# Patient Record
Sex: Female | Born: 1940 | Race: White | Hispanic: No | Marital: Married | Smoking: Former smoker
Health system: Southern US, Community
[De-identification: ages and names within clinical notes are randomized; demographics above are authoritative.]

## PROBLEM LIST (undated history)

## (undated) DIAGNOSIS — E119 Type 2 diabetes mellitus without complications: Secondary | ICD-10-CM

## (undated) DIAGNOSIS — E785 Hyperlipidemia, unspecified: Secondary | ICD-10-CM

## (undated) DIAGNOSIS — I6529 Occlusion and stenosis of unspecified carotid artery: Secondary | ICD-10-CM

## (undated) DIAGNOSIS — Z8719 Personal history of other diseases of the digestive system: Secondary | ICD-10-CM

## (undated) DIAGNOSIS — I499 Cardiac arrhythmia, unspecified: Secondary | ICD-10-CM

## (undated) DIAGNOSIS — G4730 Sleep apnea, unspecified: Secondary | ICD-10-CM

## (undated) DIAGNOSIS — I2510 Atherosclerotic heart disease of native coronary artery without angina pectoris: Secondary | ICD-10-CM

## (undated) DIAGNOSIS — J302 Other seasonal allergic rhinitis: Secondary | ICD-10-CM

## (undated) DIAGNOSIS — F419 Anxiety disorder, unspecified: Secondary | ICD-10-CM

## (undated) DIAGNOSIS — I10 Essential (primary) hypertension: Secondary | ICD-10-CM

## (undated) DIAGNOSIS — H409 Unspecified glaucoma: Secondary | ICD-10-CM

## (undated) HISTORY — DX: Essential (primary) hypertension: I10

## (undated) HISTORY — DX: Cardiac arrhythmia, unspecified: I49.9

## (undated) HISTORY — DX: Hyperlipidemia, unspecified: E78.5

## (undated) HISTORY — PX: ABDOMINAL HYSTERECTOMY: SHX81

## (undated) HISTORY — PX: RECTOCELE REPAIR: SHX761

## (undated) HISTORY — DX: Atherosclerotic heart disease of native coronary artery without angina pectoris: I25.10

## (undated) HISTORY — PX: CORONARY ANGIOPLASTY: SHX604

## (undated) HISTORY — DX: Type 2 diabetes mellitus without complications: E11.9

## (undated) HISTORY — PX: CATARACT EXTRACTION: SUR2

## (undated) HISTORY — DX: Occlusion and stenosis of unspecified carotid artery: I65.29

## (undated) HISTORY — DX: Other seasonal allergic rhinitis: J30.2

## (undated) HISTORY — DX: Unspecified glaucoma: H40.9

## (undated) HISTORY — PX: OTHER SURGICAL HISTORY: SHX169

## (undated) HISTORY — PX: CHOLECYSTECTOMY: SHX55

## (undated) HISTORY — PX: TONSILLECTOMY: SUR1361

---

## 1984-12-20 HISTORY — PX: TUBAL LIGATION: SHX77

## 1989-12-20 HISTORY — PX: VAGINAL HYSTERECTOMY: SUR661

## 2013-12-20 HISTORY — PX: UPPER GASTROINTESTINAL ENDOSCOPY: SHX188

## 2015-12-21 HISTORY — PX: COLONOSCOPY: SHX174

## 2016-07-27 ENCOUNTER — Other Ambulatory Visit: Payer: Self-pay

## 2016-07-27 ENCOUNTER — Encounter: Payer: Self-pay | Admitting: Vascular Surgery

## 2016-07-27 DIAGNOSIS — I6523 Occlusion and stenosis of bilateral carotid arteries: Secondary | ICD-10-CM

## 2016-08-13 ENCOUNTER — Encounter: Payer: Self-pay | Admitting: Vascular Surgery

## 2016-08-18 ENCOUNTER — Ambulatory Visit (INDEPENDENT_AMBULATORY_CARE_PROVIDER_SITE_OTHER): Payer: Medicare Other | Admitting: Vascular Surgery

## 2016-08-18 ENCOUNTER — Encounter: Payer: Self-pay | Admitting: Vascular Surgery

## 2016-08-18 ENCOUNTER — Ambulatory Visit (HOSPITAL_COMMUNITY)
Admission: RE | Admit: 2016-08-18 | Discharge: 2016-08-18 | Disposition: A | Discharge status: Home / Self Care | Payer: Medicare Other | Source: Ambulatory Visit | Attending: Vascular Surgery | Admitting: Vascular Surgery

## 2016-08-18 VITALS — BP 130/68 | HR 67 | Temp 97.6°F | Resp 16 | Ht 60.0 in | Wt 146.0 lb

## 2016-08-18 DIAGNOSIS — I6523 Occlusion and stenosis of bilateral carotid arteries: Secondary | ICD-10-CM

## 2016-08-18 LAB — VAS US CAROTID
LCCADDIAS: -17 cm/s
LCCADSYS: -63 cm/s
LCCAPDIAS: 20 cm/s
LEFT ECA DIAS: -11 cm/s
LEFT VERTEBRAL DIAS: 12 cm/s
LICADDIAS: -34 cm/s
LICADSYS: -95 cm/s
Left CCA prox sys: 106 cm/s
Left ICA prox dias: -18 cm/s
Left ICA prox sys: -55 cm/s
RCCADSYS: -66 cm/s
RCCAPDIAS: 15 cm/s
RCCAPSYS: 65 cm/s
RIGHT CCA MID DIAS: 14 cm/s
RIGHT ECA DIAS: 5 cm/s
RIGHT VERTEBRAL DIAS: 10 cm/s

## 2016-08-18 NOTE — Progress Notes (Signed)
Patient ID: Melinda Acevedo, female   DOB: [birth year], 75 y.o.   MRN: [id number]  Reason for Consult: New Evaluation (carotid stenosis)   Referred by Orpah Greek, MD  Subjective:     HPI:  Melinda Acevedo is a 75 y.o. female history of CAD status post stenting prediabetes hypertension high cholesterol sent for evaluation of bilateral carotid artery stenosis that was less than 50% at the diagnostic imaging center. She denies any history of stroke TIA vision loss suggestive of amaurosis. She is able to walk with major limiting factor shortness of breath. She no longer has any chest pain she has no leg pain with ambulation. She is on aspirin and statin is a former smoker.  Past Medical History:  Diagnosis Date  . CAD (coronary artery disease)   . Carotid artery occlusion   . Diabetes mellitus without complication ([place])   . Hyperlipidemia   . Hypertension    Family History  Problem Relation Age of Onset  . COPD Mother   . Heart disease Mother   . Heart disease Father   . Heart attack Father    Past Surgical History:  Procedure Laterality Date  . ABDOMINAL HYSTERECTOMY    . CATARACT EXTRACTION Bilateral   . CHOLECYSTECTOMY    . heart catherization  [id number], 2010  . RECTOCELE REPAIR    . TONSILLECTOMY    . TUBAL LIGATION  1986    Short Social History:  Social History  Substance Use Topics  . Smoking status: Former Smoker    Years: 2.00    Types: Cigarettes    Quit date: 1964  . Smokeless tobacco: Never Used  . Alcohol use No    Allergies  Allergen Reactions  . Contrast Media [Iodinated Diagnostic Agents]   . Keflex [Cephalexin]   . Macrodantin [Nitrofurantoin]   . Naproxen     Current Outpatient Prescriptions  Medication Sig Dispense Refill  . amLODipine (NORVASC) 5 MG tablet Take 5 mg by mouth daily.    Marland Kitchen aspirin 81 MG tablet Take 81 mg by mouth daily.    . cholecalciferol (VITAMIN D) 1000 units tablet Take 6,000 Units by mouth daily.    . Coenzyme Q10 (COQ10  PO) Take 200 mg by mouth 2 (two) times daily.    . DiazePAM (VALIUM PO) Take by mouth as needed.    . Loratadine (CLARITIN PO) Take 10 mg by mouth daily.    . Multiple Vitamins-Minerals (MULTIVITAMIN ADULT PO) Take by mouth.    . nitroGLYCERIN (NITROSTAT) 0.4 MG SL tablet Place 0.4 mg under the tongue every 5 (five) minutes as needed for chest pain.    . RABEprazole (ACIPHEX) 20 MG tablet Take 20 mg by mouth daily.    . rosuvastatin (CRESTOR) 5 MG tablet Take 5 mg by mouth daily.    . Travoprost (TRAVATAN OP) Apply to eye.     No current facility-administered medications for this visit.     Review of Systems  Constitutional:  Constitutional negative. Eyes: Eyes negative.  Respiratory: Positive for shortness of breath.  Cardiovascular: Positive for irregular heartbeat.  GI: Gastrointestinal negative.  Musculoskeletal: Musculoskeletal negative.  Skin: Skin negative.  Neurological: Negative for facial asymmetry, focal weakness, light-headedness, numbness, seizures, speech difficulty and syncope.        Objective:  Objective   Vitals:   08/18/16 1159 08/18/16 1202  BP: 129/66 130/68  Pulse: 67 67  Resp: 16   Temp: 97.6 F (36.4 C)   SpO2: 99%   Weight:  146 lb (66.2 kg)   Height: 5' (1.524 m)    Body mass index is 28.51 kg/m.  Physical Exam  Constitutional: She is oriented to person, place, and time. She appears well-developed and well-nourished.  HENT:  Head: Normocephalic.  Eyes: Pupils are equal, round, and reactive to light.  Neck: Normal range of motion.  Cardiovascular: Normal rate and regular rhythm.   No murmur heard. Pulses:      Carotid pulses are 2+ on the right side, and 2+ on the left side.      Radial pulses are 2+ on the right side, and 2+ on the left side.       Femoral pulses are 2+ on the right side, and 2+ on the left side.      Popliteal pulses are 2+ on the right side, and 2+ on the left side.       Dorsalis pedis pulses are 2+ on the right side,  and 2+ on the left side.       Posterior tibial pulses are 2+ on the right side, and 2+ on the left side.  Abdominal: Soft. She exhibits no mass.  Musculoskeletal: Normal range of motion. She exhibits no edema or tenderness.  Neurological: She is alert and oriented to person, place, and time.  Skin: Skin is warm and dry.    Data: Doppler velocity suggest 1-39% left proximal ICA stenosis.     Assessment/Plan:   75 year old white female history of hypertension hyperlipidemia CAD status post stenting on aspirin and statin were smoker. She is here for evaluation of carotid arteries that are asymptomatic at this time with very low-grade stenoses. I recommended to her that we follow this medically and have offered her to follow-up in 2 years however she desires at this point to follow up in 1 year. I told her if the testis Valrie Hart that time we should extend her about 2 years continue her on medical therapy which she is already receiving. Should she have symptoms of stroke TIA and amaurosis which we discussed we will see her back sooner.     [name] MD [facility]

## 2016-08-19 ENCOUNTER — Other Ambulatory Visit: Payer: Self-pay | Admitting: Vascular Surgery

## 2016-08-19 DIAGNOSIS — I25709 Atherosclerosis of coronary artery bypass graft(s), unspecified, with unspecified angina pectoris: Secondary | ICD-10-CM

## 2016-08-19 DIAGNOSIS — I6529 Occlusion and stenosis of unspecified carotid artery: Secondary | ICD-10-CM

## 2016-08-20 ENCOUNTER — Encounter (HOSPITAL_COMMUNITY): Payer: Medicare Other

## 2016-08-20 ENCOUNTER — Encounter: Payer: Medicare Other | Admitting: Vascular Surgery

## 2017-08-19 ENCOUNTER — Encounter (HOSPITAL_COMMUNITY): Payer: Medicare Other

## 2017-08-19 ENCOUNTER — Ambulatory Visit: Payer: Medicare Other | Admitting: Vascular Surgery

## 2017-08-26 DIAGNOSIS — G4733 Obstructive sleep apnea (adult) (pediatric): Secondary | ICD-10-CM | POA: Insufficient documentation

## 2017-09-02 ENCOUNTER — Encounter (HOSPITAL_COMMUNITY): Payer: Medicare Other

## 2017-09-02 ENCOUNTER — Ambulatory Visit: Payer: Medicare Other | Admitting: Vascular Surgery

## 2017-10-07 ENCOUNTER — Encounter: Payer: Self-pay | Admitting: Vascular Surgery

## 2017-10-07 ENCOUNTER — Ambulatory Visit (HOSPITAL_COMMUNITY)
Admission: RE | Admit: 2017-10-07 | Discharge: 2017-10-07 | Disposition: A | Discharge status: Home / Self Care | Payer: Medicare Other | Source: Ambulatory Visit | Attending: Vascular Surgery | Admitting: Vascular Surgery

## 2017-10-07 ENCOUNTER — Ambulatory Visit (INDEPENDENT_AMBULATORY_CARE_PROVIDER_SITE_OTHER): Payer: Medicare Other | Admitting: Vascular Surgery

## 2017-10-07 VITALS — BP 155/74 | HR 64 | Temp 98.2°F | Resp 18 | Ht 60.0 in | Wt 153.5 lb

## 2017-10-07 DIAGNOSIS — I6523 Occlusion and stenosis of bilateral carotid arteries: Secondary | ICD-10-CM | POA: Diagnosis not present

## 2017-10-07 DIAGNOSIS — I6529 Occlusion and stenosis of unspecified carotid artery: Secondary | ICD-10-CM | POA: Diagnosis not present

## 2017-10-07 DIAGNOSIS — I25709 Atherosclerosis of coronary artery bypass graft(s), unspecified, with unspecified angina pectoris: Secondary | ICD-10-CM | POA: Diagnosis not present

## 2017-10-07 LAB — VAS US CAROTID
LCCADDIAS: 16 cm/s
LCCAPSYS: 106 cm/s
LEFT ECA DIAS: -9 cm/s
LEFT VERTEBRAL DIAS: 14 cm/s
LICADDIAS: -20 cm/s
LICAPDIAS: 14 cm/s
LICAPSYS: 64 cm/s
Left CCA dist sys: 69 cm/s
Left CCA prox dias: 17 cm/s
Left ICA dist sys: -72 cm/s
RIGHT CCA MID DIAS: -15 cm/s
RIGHT ECA DIAS: -11 cm/s
RIGHT VERTEBRAL DIAS: -10 cm/s
Right CCA prox dias: 11 cm/s
Right CCA prox sys: 75 cm/s
Right cca dist sys: -74 cm/s

## 2017-10-07 NOTE — Progress Notes (Signed)
History of Present Illness:  Patient is a 76 y.o. year old female who presents for follow up  evaluation of carotid stenosis.  The patient denies symptoms of TIA, amaurosis, or stroke.  The patient is currently on aspirin antiplatelet therapy.  The carotid stenosis was found post coronary stent placement.  Other medical problems include HTN, CAD and hypercholesterolemia.     Past Medical History:  Diagnosis Date  . CAD (coronary artery disease)   . Carotid artery occlusion   . Diabetes mellitus without complication ([place])   . Hyperlipidemia   . Hypertension     Past Surgical History:  Procedure Laterality Date  . ABDOMINAL HYSTERECTOMY    . CATARACT EXTRACTION Bilateral   . CHOLECYSTECTOMY    . heart catherization  [id number], 2010  . RECTOCELE REPAIR    . TONSILLECTOMY    . TUBAL LIGATION  1986     Social History Social History  Substance Use Topics  . Smoking status: Former Smoker    Years: 2.00    Types: Cigarettes    Quit date: 1964  . Smokeless tobacco: Never Used  . Alcohol use No    Family History Family History  Problem Relation Age of Onset  . COPD Mother   . Heart disease Mother   . Heart disease Father   . Heart attack Father     Allergies  Allergies  Allergen Reactions  . Contrast Media [Iodinated Diagnostic Agents]   . Keflex [Cephalexin]   . Macrodantin [Nitrofurantoin]   . Naproxen      Current Outpatient Prescriptions  Medication Sig Dispense Refill  . amLODipine (NORVASC) 5 MG tablet Take 5 mg by mouth daily.    Marland Kitchen aspirin 81 MG tablet Take 81 mg by mouth daily.    . cholecalciferol (VITAMIN D) 1000 units tablet Take 5,000 Units by mouth daily.     . Coenzyme Q10 (COQ10 PO) Take 200 mg by mouth 2 (two) times daily.    . DiazePAM (VALIUM PO) Take by mouth as needed.    . ezetimibe (ZETIA) 10 MG tablet Take 10 mg by mouth daily.    . Loratadine (CLARITIN PO) Take 10 mg by mouth daily.    . Multiple Vitamins-Minerals (MULTIVITAMIN  ADULT PO) Take by mouth.    . nitroGLYCERIN (NITROSTAT) 0.4 MG SL tablet Place 0.4 mg under the tongue every 5 (five) minutes as needed for chest pain.    . RABEprazole (ACIPHEX) 20 MG tablet Take 20 mg by mouth daily.    . Travoprost (TRAVATAN OP) Apply to eye.    . rosuvastatin (CRESTOR) 5 MG tablet Take 5 mg by mouth daily.     No current facility-administered medications for this visit.     ROS:   General:  No weight loss, Fever, chills  HEENT: No recent headaches, no nasal bleeding, no visual changes, no sore throat  Neurologic: Positive dizziness, blackouts, seizures. No recent symptoms of stroke or mini- stroke. No recent episodes of slurred speech, or temporary blindness.  Cardiac: No recent episodes of chest pain/pressure, no shortness of breath at rest.  No shortness of breath with exertion.  Denies history of atrial fibrillation or irregular heartbeat  Vascular: No history of rest pain in feet.  No history of claudication.  No history of non-healing ulcer, No history of DVT   Pulmonary: No home oxygen, no productive cough, no hemoptysis,  No asthma or wheezing  Musculoskeletal:  [ ]  Arthritis, [ ]  Low back pain,  [  x ] Joint pain  Hematologic:No history of hypercoagulable state.  No history of easy bleeding.  No history of anemia  Gastrointestinal: No hematochezia or melena,  No gastroesophageal reflux, no trouble swallowing  Urinary: [ ]  chronic Kidney disease, [ ]  on HD - [ ]  MWF or [ ]  TTHS, [ ]  Burning with urination, [ ]  Frequent urination, [ ]  Difficulty urinating;   Skin: No rashes  Psychological: No history of anxiety,  No history of depression   Physical Examination  Vitals:   10/07/17 1255 10/07/17 1258 10/07/17 1300 10/07/17 1301  BP: (!) 148/70 (!) 153/73 (!) 154/73 (!) 155/74  Pulse: 64     Resp: 18     Temp: 98.2 F (36.8 C)     TempSrc: Oral     SpO2: 99%     Weight: 153 lb 8 oz (69.6 kg)     Height: 5' (1.524 m)       Body mass index is  29.98 kg/m.  General:  Alert and oriented, no acute distress HEENT: Normal Neck: No bruit or JVD Pulmonary: Clear to auscultation bilaterally Cardiac: Regular Rate and Rhythm without murmur Gastrointestinal: Soft, non-tender, non-distended, no mass, no scars Skin: No rash Extremity Pulses:  2+ radial, brachial, femoral, dorsalis pedis, posterior tibial pulses bilaterally Musculoskeletal: No deformity or edema  Neurologic: Upper and lower extremity motor 5/5 and symmetric  DATA:  Carotid duplex shows less than 40% stenosis B ICA was independently interpreted today ([facility])   ASSESSMENT:  Asymptomatic carotid stenosis   PLAN: She has had 2 carotid duplex studies a year apart that demonstrate minimal stenosis.  She reports no symptoms of stroke.  At this point we will have her follow up in 2 years for a repeat carotid duplex.  She will continue maximum medical management with daily Aspirin, Zetia for cholesterol and HTN control.   Theda Sers, French Kendra MAUREEN PA-C [facility]  The patient was seen in conjunction with Dr. Donzetta Matters today  I have independently interviewed and examined patient and agree with PA assessment and plan above. Carotid stenosis was mildly overstated at outside facility now confirmed by 2 duplex studies here. Discussed the signs and symptoms of stroke and TIA and she demonstrates good understanding. I personally interpreted her carotid duplex. She can f/u in 2 years.  Brandon C. Donzetta Matters, MD [facility] [place] Office: [phone] Pager: [phone]

## 2019-11-30 ENCOUNTER — Ambulatory Visit: Payer: Medicare Other | Admitting: Vascular Surgery

## 2019-11-30 ENCOUNTER — Encounter (HOSPITAL_COMMUNITY): Payer: Medicare Other

## 2020-01-03 ENCOUNTER — Other Ambulatory Visit: Payer: Self-pay

## 2020-01-03 DIAGNOSIS — I6529 Occlusion and stenosis of unspecified carotid artery: Secondary | ICD-10-CM

## 2020-01-04 ENCOUNTER — Ambulatory Visit (HOSPITAL_COMMUNITY)
Admission: RE | Admit: 2020-01-04 | Discharge: 2020-01-04 | Disposition: A | Discharge status: Home / Self Care | Payer: Medicare Other | Source: Ambulatory Visit | Attending: Vascular Surgery | Admitting: Vascular Surgery

## 2020-01-04 ENCOUNTER — Ambulatory Visit (INDEPENDENT_AMBULATORY_CARE_PROVIDER_SITE_OTHER): Payer: Medicare Other | Admitting: Vascular Surgery

## 2020-01-04 ENCOUNTER — Other Ambulatory Visit: Payer: Self-pay

## 2020-01-04 ENCOUNTER — Encounter: Payer: Self-pay | Admitting: Vascular Surgery

## 2020-01-04 VITALS — BP 164/70 | HR 74 | Temp 98.0°F | Resp 20 | Wt 159.0 lb

## 2020-01-04 DIAGNOSIS — I6529 Occlusion and stenosis of unspecified carotid artery: Secondary | ICD-10-CM | POA: Diagnosis not present

## 2020-01-04 NOTE — Progress Notes (Signed)
Patient ID: Melinda Acevedo, female   DOB: 1941-07-24, 79 y.o.   MRN: [id number]  Reason for Consult: Follow-up   Referred by Lady Deutscher, MD  Subjective:     HPI:  Melinda Acevedo is a 79 y.o. female presents for evaluation carotid artery stenosis.  This was found after work-up during coronary artery stent placement.  She was here 2 years ago.  Risk factors include hypertension, hyperlipidemia, coronary artery disease, diabetes and history of smoking.  She denies any stroke TIA or amaurosis.  She takes aspirin Crestor daily.  Past Medical History:  Diagnosis Date  . CAD (coronary artery disease)   . Carotid artery occlusion   . Diabetes mellitus without complication ([place])   . Hyperlipidemia   . Hypertension    Family History  Problem Relation Age of Onset  . COPD Mother   . Heart disease Mother   . Heart disease Father   . Heart attack Father    Past Surgical History:  Procedure Laterality Date  . ABDOMINAL HYSTERECTOMY    . CATARACT EXTRACTION Bilateral   . CHOLECYSTECTOMY    . heart catherization  [id number], 2010  . RECTOCELE REPAIR    . TONSILLECTOMY    . TUBAL LIGATION  1986    Short Social History:  Social History   Tobacco Use  . Smoking status: Former Smoker    Years: 2.00    Types: Cigarettes    Quit date: 1964    Years since quitting: 57.0  . Smokeless tobacco: Never Used  Substance Use Topics  . Alcohol use: No    Allergies  Allergen Reactions  . Contrast Media [Iodinated Diagnostic Agents]   . Keflex [Cephalexin]   . Naproxen     Current Outpatient Medications  Medication Sig Dispense Refill  . amLODipine (NORVASC) 5 MG tablet Take 5 mg by mouth daily.    Marland Kitchen aspirin 81 MG tablet Take 81 mg by mouth daily.    . benazepril (LOTENSIN) 10 MG tablet     . cholecalciferol (VITAMIN D) 1000 units tablet Take 5,000 Units by mouth daily.     . Loratadine (CLARITIN PO) Take 10 mg by mouth daily.    . Multiple Vitamins-Minerals (MULTIVITAMIN ADULT PO)  Take by mouth.    . nitrofurantoin, macrocrystal-monohydrate, (MACROBID) 100 MG capsule Take 100 mg by mouth 2 (two) times daily.    . nitroGLYCERIN (NITROSTAT) 0.4 MG SL tablet Place 0.4 mg under the tongue every 5 (five) minutes as needed for chest pain.    . RABEprazole (ACIPHEX) 20 MG tablet Take 20 mg by mouth daily.    . rosuvastatin (CRESTOR) 5 MG tablet Take 5 mg by mouth daily.    . Travoprost (TRAVATAN OP) Apply to eye.     No current facility-administered medications for this visit.    Review of Systems  Constitutional:  Constitutional negative. HENT: HENT negative.  Eyes: Eyes negative.  Respiratory: Respiratory negative.  Cardiovascular: Cardiovascular negative.  GI: Gastrointestinal negative.  Musculoskeletal: Musculoskeletal negative.  Skin: Skin negative.  Neurological: Neurological negative. Hematologic: Hematologic/lymphatic negative.  Psychiatric: Psychiatric negative.        Objective:  Objective   Vitals:   01/04/20 1124 01/04/20 1128  BP: (!) 161/73 (!) 164/70  Pulse: 74   Resp: 20   Temp: 98 F (36.7 C)   SpO2: 97%   Weight: 159 lb (72.1 kg)    Body mass index is 31.05 kg/m.  Physical Exam HENT:     Head: Normocephalic.  Nose: Nose normal.     Mouth/Throat:     Mouth: Mucous membranes are moist.  Eyes:     Pupils: Pupils are equal, round, and reactive to light.  Neck:     Vascular: No carotid bruit.  Cardiovascular:     Rate and Rhythm: Normal rate and regular rhythm.  Pulmonary:     Effort: Pulmonary effort is normal.  Abdominal:     General: Abdomen is flat.     Palpations: Abdomen is soft.  Musculoskeletal:        General: No swelling. Normal range of motion.  Skin:    General: Skin is warm.     Capillary Refill: Capillary refill takes less than 2 seconds.  Neurological:     General: No focal deficit present.     Mental Status: She is alert.  Psychiatric:        Mood and Affect: Mood normal.        Behavior: Behavior  normal.        Thought Content: Thought content normal.        Judgment: Judgment normal.     Data: I have independently interpreted her carotid duplex which is 1 to 39% bilaterally.     Assessment/Plan:     79 year old female with mild stenosis bilateral carotid arteries that is asymptomatic found incidentally during work-up with coronary artery disease a few years back.  She will continue aspirin and statin.  Follow-up in 5 years with repeat duplex.     [name] MD [facility]

## 2020-01-08 ENCOUNTER — Other Ambulatory Visit: Payer: Self-pay | Admitting: *Deleted

## 2020-01-08 DIAGNOSIS — I6529 Occlusion and stenosis of unspecified carotid artery: Secondary | ICD-10-CM

## 2020-12-09 ENCOUNTER — Telehealth: Payer: Self-pay

## 2020-12-09 NOTE — Telephone Encounter (Signed)
NOTES ON Melinda Acevedo [phone], SENT REFERRAL TO SCHEDULING

## 2020-12-24 ENCOUNTER — Telehealth: Payer: Self-pay

## 2020-12-24 NOTE — Telephone Encounter (Signed)
Patient has been having bilateral neck pains since September. She has been evaluated by cardiology and is going to see GI as well. PCP would like her to see vascular again to rule out any carotid stenosis. She has a 5 year carotid follow up with [place], moved her to a slot in February for carotid scan. Patient denies any stroke like symptoms, just pain with activity. Advised her I did not think it was due to carotid stenosis but that we would do another duplex just to be sure. We discussed trying heat/cold to help relieve pain. Patient verbalizes understanding.

## 2021-01-01 ENCOUNTER — Encounter (INDEPENDENT_AMBULATORY_CARE_PROVIDER_SITE_OTHER): Payer: Self-pay | Admitting: Gastroenterology

## 2021-01-01 ENCOUNTER — Other Ambulatory Visit: Payer: Self-pay

## 2021-01-01 ENCOUNTER — Ambulatory Visit (INDEPENDENT_AMBULATORY_CARE_PROVIDER_SITE_OTHER): Payer: Medicare Other | Admitting: Gastroenterology

## 2021-01-01 DIAGNOSIS — M542 Cervicalgia: Secondary | ICD-10-CM | POA: Insufficient documentation

## 2021-01-01 DIAGNOSIS — Z86010 Personal history of colonic polyps: Secondary | ICD-10-CM

## 2021-01-01 NOTE — Progress Notes (Signed)
Maylon Peppers, M.D. Gastroenterology & Hepatology [facility] [address] [place], [place] [address] Primary Care Physician: Sherrilee Gilles, [place] [address]  Referring MD: PCP  Chief Complaint:  Neck pain  History of Present Illness: Melinda Acevedo is a 80 y.o. female with PMH CAD s/p stent placement, carotid artery occlusion, HLD, HTN, pre diabetes, glaucoma, who presents for evaluation of neck pain.  Patient reports that since September 2021 she has noticed that after exerting herself she presented recurrent episodes of neck pain. She describes the pain as a pressure that lasts 1-2 minutes when she exerts herself. It may happen once a week but she does not know why she has these symptoms. She feels a light headache when these symptoms happen. Has not presented any syncope, chest pain, lightheadedness or dizziness. The patient denies having any nausea, vomiting, fever, chills, hematochezia, melena, hematemesis, abdominal distention, abdominal pain, diarrhea, jaundice, pruritus or weight loss.  The patient states had a neck ultrasound that was negative for any alterations, no report is available.  She was evaluated by her cardiologist given the concern for angina.  The patient underwent a stress test and an myocardial perfusion imaging test on October 09, 2020 which showed an ejection fraction of 63% with normal perfusion.  She had poor exercise tolerance and developed fatigue, chest pain and shortness of breath upon exertion.  Her resting EKG showed depression of the ST segment in V3, V6, DII and aVF, this was also visualized upon the stress but no active changes were seen in the EKG.  Some PVCs were noted during exercise and a poor recovery.  Patient was cleared by her cardiologist.  Last EGD:7 years ago - small hiatal hernia per the patient, no report available Last Colonoscopy:5 years ago, had some polyps removed  (adenomas) per patient no report is available  FHx: neg for any gastrointestinal/liver disease, breast cancer aunt Social: neg smoking, alcohol or illicit drug use Surgical: hysterectomy, cholecystectomy  Past Medical History: Past Medical History:  Diagnosis Date  . CAD (coronary artery disease)   . Carotid artery occlusion   . Diabetes mellitus without complication ([place])   . Hyperlipidemia   . Hypertension     Past Surgical History: Past Surgical History:  Procedure Laterality Date  . ABDOMINAL HYSTERECTOMY    . CATARACT EXTRACTION Bilateral   . CHOLECYSTECTOMY    . heart catherization  [id number], 2010  . RECTOCELE REPAIR    . TONSILLECTOMY    . TUBAL LIGATION  1986    Family History: Family History  Problem Relation Age of Onset  . COPD Mother   . Heart disease Mother   . Heart disease Father   . Heart attack Father     Social History: Social History   Tobacco Use  Smoking Status Former Smoker  . Years: 2.00  . Types: Cigarettes  . Quit date: 1964  . Years since quitting: 58.0  Smokeless Tobacco Never Used   Social History   Substance and Sexual Activity  Alcohol Use No   Social History   Substance and Sexual Activity  Drug Use No    Allergies: Allergies  Allergen Reactions  . Contrast Media [Iodinated Diagnostic Agents]   . Keflex [Cephalexin]   . Naproxen     Medications: Current Outpatient Medications  Medication Sig Dispense Refill  . amLODipine (NORVASC) 5 MG tablet Take 5 mg by mouth daily.    Marland Kitchen aspirin 81 MG tablet Take  81 mg by mouth daily.    . benazepril (LOTENSIN) 10 MG tablet Take 10 mg by mouth 2 (two) times daily.    . cholecalciferol (VITAMIN D) 1000 units tablet Take 5,000 Units by mouth daily.     . Loratadine (CLARITIN PO) Take 10 mg by mouth daily.    . Multiple Vitamins-Minerals (MULTIVITAMIN ADULT PO) Take by mouth daily.    . nitrofurantoin, macrocrystal-monohydrate, (MACROBID) 100 MG capsule Take 100 mg by mouth 2  (two) times daily.    . nitroGLYCERIN (NITROSTAT) 0.4 MG SL tablet Place 0.4 mg under the tongue every 5 (five) minutes as needed for chest pain.    . RABEprazole (ACIPHEX) 20 MG tablet Take 20 mg by mouth daily.    . rosuvastatin (CRESTOR) 5 MG tablet Take 5 mg by mouth daily.    . Travoprost (TRAVATAN OP) Apply to eye. Patient uses 1 drop to each eye at bedtime.     No current facility-administered medications for this visit.    Review of Systems: GENERAL: negative for malaise, night sweats HEENT: No changes in hearing or vision, no nose bleeds or other nasal problems. NECK: Negative for lumps, goiter, pain and significant neck swelling RESPIRATORY: Negative for cough, wheezing CARDIOVASCULAR: Negative for chest pain, leg swelling, palpitations, orthopnea GI: SEE HPI MUSCULOSKELETAL: Negative for joint pain or swelling, back pain, and muscle pain. SKIN: Negative for lesions, rash PSYCH: Negative for sleep disturbance, mood disorder and recent psychosocial stressors. HEMATOLOGY Negative for prolonged bleeding, bruising easily, and swollen nodes. ENDOCRINE: Negative for cold or heat intolerance, polyuria, polydipsia and goiter. NEURO: negative for tremor, gait imbalance, syncope and seizures. The remainder of the review of systems is noncontributory.   Physical Exam: BP (!) 162/74 (BP Location: Left Arm, Patient Position: Sitting, Cuff Size: Large)   Pulse 76   Temp 98.4 F (36.9 C) (Oral)   Ht 5' (1.524 m)   Wt 161 lb 8 oz (73.3 kg)   BMI 31.54 kg/m  GENERAL: The patient is AO x3, in no acute distress. HEENT: Head is normocephalic and atraumatic. EOMI are intact. Mouth is well hydrated and without lesions. NECK: Supple. No masses, nontender upon palpation LUNGS: Clear to auscultation. No presence of rhonchi/wheezing/rales. Adequate chest expansion HEART: RRR, normal s1 and s2. ABDOMEN: Soft, nontender, no guarding, no peritoneal signs, and nondistended. BS +. No  masses. EXTREMITIES: Without any cyanosis, clubbing, rash, lesions or edema. NEUROLOGIC: AOx3, no focal motor deficit. SKIN: no jaundice, no rashes   Imaging/Labs: as above  I personally reviewed and interpreted the available labs, imaging and endoscopic files.  Impression and Plan: Melinda Acevedo is a 80 y.o. female with PMH CAD s/p stent placement, carotid artery occlusion, HLD, HTN, pre diabetes, glaucoma, who presents for evaluation of neck pain.  The patient had presence of neck pain induced with exertion but no other gastrointestinal complaints.  She has undergone a recent cardiac work-up to rule out any ischemia which will be one of the main etiologies that should be rule out.  However, the patient wants to get a second opinion to definitively rule out any cardiac etiology.  I consider this is appropriate.  Other etiologies such as carotid vascular impairment should be considered, she is scheduled to have a repeat Doppler next week.  I discussed with her the possibility of performing an EGD to rule out any intraluminal esophageal pathology, although I consider this is less likely why she is presenting the symptoms.  We will schedule this procedure for  the end of February so she can have the rest of the work-up performed and have clearance from the cardiovascular perspective.  At that time we will also perform a colonoscopy given her history of colonic polyps.  - Proceed with scheduled neck Doppler -Patient to update [place] with discussion held with her new cardiologist - Schedule EGD and colonoscopy in late February  All questions were answered.      Maylon Peppers, MD Gastroenterology and Hepatology [facility]

## 2021-01-01 NOTE — Patient Instructions (Signed)
Proceed with scheduled neck Doppler Please update [place] about the discussion you will hold with the new cardiologist that will evaluate you Schedule EGD and colonoscopy in late February

## 2021-01-06 ENCOUNTER — Other Ambulatory Visit: Payer: Self-pay

## 2021-01-06 ENCOUNTER — Ambulatory Visit: Payer: Medicare Other | Admitting: Cardiology

## 2021-01-06 DIAGNOSIS — E039 Hypothyroidism, unspecified: Secondary | ICD-10-CM | POA: Insufficient documentation

## 2021-01-06 DIAGNOSIS — I10 Essential (primary) hypertension: Secondary | ICD-10-CM | POA: Insufficient documentation

## 2021-01-06 DIAGNOSIS — K5900 Constipation, unspecified: Secondary | ICD-10-CM | POA: Insufficient documentation

## 2021-01-06 DIAGNOSIS — E559 Vitamin D deficiency, unspecified: Secondary | ICD-10-CM | POA: Insufficient documentation

## 2021-01-06 DIAGNOSIS — K219 Gastro-esophageal reflux disease without esophagitis: Secondary | ICD-10-CM | POA: Insufficient documentation

## 2021-01-06 DIAGNOSIS — M810 Age-related osteoporosis without current pathological fracture: Secondary | ICD-10-CM | POA: Insufficient documentation

## 2021-01-06 DIAGNOSIS — F419 Anxiety disorder, unspecified: Secondary | ICD-10-CM | POA: Insufficient documentation

## 2021-01-06 DIAGNOSIS — I6529 Occlusion and stenosis of unspecified carotid artery: Secondary | ICD-10-CM

## 2021-01-06 DIAGNOSIS — R5383 Other fatigue: Secondary | ICD-10-CM | POA: Insufficient documentation

## 2021-01-06 DIAGNOSIS — E785 Hyperlipidemia, unspecified: Secondary | ICD-10-CM | POA: Insufficient documentation

## 2021-01-06 DIAGNOSIS — K5792 Diverticulitis of intestine, part unspecified, without perforation or abscess without bleeding: Secondary | ICD-10-CM | POA: Insufficient documentation

## 2021-01-06 DIAGNOSIS — R7301 Impaired fasting glucose: Secondary | ICD-10-CM | POA: Insufficient documentation

## 2021-01-12 ENCOUNTER — Telehealth (INDEPENDENT_AMBULATORY_CARE_PROVIDER_SITE_OTHER): Payer: Self-pay | Admitting: Gastroenterology

## 2021-01-12 NOTE — Telephone Encounter (Signed)
Patient left voice mail message stating she was seen in the office on 1/13 - states she is supposed to have a colonoscopy and an EGD - states she hasn't heard anything about scheduling - please advise - ph# [phone]

## 2021-01-13 ENCOUNTER — Other Ambulatory Visit (INDEPENDENT_AMBULATORY_CARE_PROVIDER_SITE_OTHER): Payer: Self-pay

## 2021-01-13 ENCOUNTER — Telehealth (INDEPENDENT_AMBULATORY_CARE_PROVIDER_SITE_OTHER): Payer: Self-pay

## 2021-01-13 DIAGNOSIS — Z1211 Encounter for screening for malignant neoplasm of colon: Secondary | ICD-10-CM

## 2021-01-13 MED ORDER — SUTAB 1479-225-188 MG PO TABS: 188.0000 mg | ORAL_TABLET | Freq: Two times a day (BID) | ORAL | 0 refills | Status: AC

## 2021-01-13 MED ORDER — SUTAB 1479-225-188 MG PO TABS: 188.0000 mg | ORAL_TABLET | Freq: Two times a day (BID) | ORAL | 0 refills | Status: DC

## 2021-01-13 NOTE — Telephone Encounter (Signed)
LeighAnn Antwanette Wesche, CMA  

## 2021-01-13 NOTE — Telephone Encounter (Signed)
I spoke to Mrs Grondin and she is scheduled and aware

## 2021-01-15 ENCOUNTER — Encounter (INDEPENDENT_AMBULATORY_CARE_PROVIDER_SITE_OTHER): Payer: Self-pay

## 2021-01-19 ENCOUNTER — Telehealth (INDEPENDENT_AMBULATORY_CARE_PROVIDER_SITE_OTHER): Payer: Self-pay

## 2021-01-19 DIAGNOSIS — Z1211 Encounter for screening for malignant neoplasm of colon: Secondary | ICD-10-CM

## 2021-01-19 MED ORDER — NA SULFATE-K SULFATE-MG SULF 17.5-3.13-1.6 GM/177ML PO SOLN: 354.0000 mL | Freq: Once | ORAL | 0 refills | Status: AC

## 2021-01-20 NOTE — Telephone Encounter (Signed)
Melinda Acevedo, CMA  

## 2021-01-21 ENCOUNTER — Encounter (INDEPENDENT_AMBULATORY_CARE_PROVIDER_SITE_OTHER): Payer: Self-pay

## 2021-01-23 ENCOUNTER — Ambulatory Visit: Payer: Medicare Other

## 2021-01-23 ENCOUNTER — Inpatient Hospital Stay (HOSPITAL_COMMUNITY): Admission: RE | Admit: 2021-01-23 | Payer: Medicare Other | Source: Ambulatory Visit

## 2021-02-03 NOTE — Patient Instructions (Signed)
Melinda Acevedo  02/03/2021     @PREFPERIOPPHARMACY @   Your procedure is scheduled on  02/06/2021.    Report to [facility] at  [place].M.   Call this number if you have problems the morning of surgery:  [phone]   Remember:  Follow the diet and prep instructions given to you by the office.                      Take these medicines the morning of surgery with A SIP OF WATER  Amlodipine, claritin, aciphex.    Please brush your teeth.  Do not wear jewelry, make-up or nail polish.  Do not wear lotions, powders, or perfumes, or deodorant.  Do not shave 48 hours prior to surgery.  Men may shave face and neck.  Do not bring valuables to the hospital.  [facility] is not responsible for any belongings or valuables.  Contacts, dentures or bridgework may not be worn into surgery.  Leave your suitcase in the car.  After surgery it may be brought to your room.  For patients admitted to the hospital, discharge time will be determined by your treatment team.  Patients discharged the day of surgery will not be allowed to drive home and must have someone with them for 24 hours.    Special instructions:   DO NOT smoke tobacco or vape the morning of your procedure.   Please read over the following fact sheets that you were given. Anesthesia Post-op Instructions and Care and Recovery After Surgery       Upper Endoscopy, Adult, Care After This sheet gives you information about how to care for yourself after your procedure. Your health care provider may also give you more specific instructions. If you have problems or questions, contact your health care provider. What can I expect after the procedure? After the procedure, it is common to have:  A sore throat.  Mild stomach pain or discomfort.  Bloating.  Nausea. Follow these instructions at home:  Follow instructions from your health care provider about what to eat or drink after your procedure.  Return to your  normal activities as told by your health care provider. Ask your health care provider what activities are safe for you.  Take over-the-counter and prescription medicines only as told by your health care provider.  If you were given a sedative during the procedure, it can affect you for several hours. Do not drive or operate machinery until your health care provider says that it is safe.  Keep all follow-up visits as told by your health care provider. This is important.   Contact a health care provider if you have:  A sore throat that lasts longer than one day.  Trouble swallowing. Get help right away if:  You vomit blood or your vomit looks like coffee grounds.  You have: ? A fever. ? Bloody, black, or tarry stools. ? A severe sore throat or you cannot swallow. ? Difficulty breathing. ? Severe pain in your chest or abdomen. Summary  After the procedure, it is common to have a sore throat, mild stomach discomfort, bloating, and nausea.  If you were given a sedative during the procedure, it can affect you for several hours. Do not drive or operate machinery until your health care provider says that it is safe.  Follow instructions from your health care provider about what to eat or drink after your procedure.  Return to your normal activities as told by your health care provider. This information is not intended to replace advice given to you by your health care provider. Make sure you discuss any questions you have with your health care provider. Document Revised: 12/04/2019 Document Reviewed: 05/08/2018 Elsevier Patient Education  2021 [place].  Colonoscopy, Adult, Care After This sheet gives you information about how to care for yourself after your procedure. Your health care provider may also give you more specific instructions. If you have problems or questions, contact your health care provider. What can I expect after the procedure? After the procedure, it is common  to have:  A small amount of blood in your stool for 24 hours after the procedure.  Some gas.  Mild cramping or bloating of your abdomen. Follow these instructions at home: Eating and drinking  Drink enough fluid to keep your urine pale yellow.  Follow instructions from your health care provider about eating or drinking restrictions.  Resume your normal diet as instructed by your health care provider. Avoid heavy or fried foods that are hard to digest.   Activity  Rest as told by your health care provider.  Avoid sitting for a long time without moving. Get up to take short walks every 1-2 hours. This is important to improve blood flow and breathing. Ask for help if you feel weak or unsteady.  Return to your normal activities as told by your health care provider. Ask your health care provider what activities are safe for you. Managing cramping and bloating  Try walking around when you have cramps or feel bloated.  Apply heat to your abdomen as told by your health care provider. Use the heat source that your health care provider recommends, such as a moist heat pack or a heating pad. ? Place a towel between your skin and the heat source. ? Leave the heat on for 20-30 minutes. ? Remove the heat if your skin turns bright red. This is especially important if you are unable to feel pain, heat, or cold. You may have a greater risk of getting burned.   General instructions  If you were given a sedative during the procedure, it can affect you for several hours. Do not drive or operate machinery until your health care provider says that it is safe.  For the first 24 hours after the procedure: ? Do not sign important documents. ? Do not drink alcohol. ? Do your regular daily activities at a slower pace than normal. ? Eat soft foods that are easy to digest.  Take over-the-counter and prescription medicines only as told by your health care provider.  Keep all follow-up visits as told by  your health care provider. This is important. Contact a health care provider if:  You have blood in your stool 2-3 days after the procedure. Get help right away if you have:  More than a small spotting of blood in your stool.  Large blood clots in your stool.  Swelling of your abdomen.  Nausea or vomiting.  A fever.  Increasing pain in your abdomen that is not relieved with medicine. Summary  After the procedure, it is common to have a small amount of blood in your stool. You may also have mild cramping and bloating of your abdomen.  If you were given a sedative during the procedure, it can affect you for several hours. Do not drive or operate machinery until your health care provider says that it is safe.  Get help right away if you have a lot of blood in your stool, nausea or vomiting, a fever, or increased pain in your abdomen. This information is not intended to replace advice given to you by your health care provider. Make sure you discuss any questions you have with your health care provider. Document Revised: 11/30/2019 Document Reviewed: 07/02/2019 Elsevier Patient Education  2021 [place] After This sheet gives you information about how to care for yourself after your procedure. Your health care provider may also give you more specific instructions. If you have problems or questions, contact your health care provider. What can I expect after the procedure? After the procedure, it is common to have:  Tiredness.  Forgetfulness about what happened after the procedure.  Impaired judgment for important decisions.  Nausea or vomiting.  Some difficulty with balance. Follow these instructions at home: For the time period you were told by your health care provider:  Rest as needed.  Do not participate in activities where you could fall or become injured.  Do not drive or use machinery.  Do not drink alcohol.  Do not take  sleeping pills or medicines that cause drowsiness.  Do not make important decisions or sign legal documents.  Do not take care of children on your own.      Eating and drinking  Follow the diet that is recommended by your health care provider.  Drink enough fluid to keep your urine pale yellow.  If you vomit: ? Drink water, juice, or soup when you can drink without vomiting. ? Make sure you have little or no nausea before eating solid foods. General instructions  Have a responsible adult stay with you for the time you are told. It is important to have someone help care for you until you are awake and alert.  Take over-the-counter and prescription medicines only as told by your health care provider.  If you have sleep apnea, surgery and certain medicines can increase your risk for breathing problems. Follow instructions from your health care provider about wearing your sleep device: ? Anytime you are sleeping, including during daytime naps. ? While taking prescription pain medicines, sleeping medicines, or medicines that make you drowsy.  Avoid smoking.  Keep all follow-up visits as told by your health care provider. This is important. Contact a health care provider if:  You keep feeling nauseous or you keep vomiting.  You feel light-headed.  You are still sleepy or having trouble with balance after 24 hours.  You develop a rash.  You have a fever.  You have redness or swelling around the IV site. Get help right away if:  You have trouble breathing.  You have new-onset confusion at home. Summary  For several hours after your procedure, you may feel tired. You may also be forgetful and have poor judgment.  Have a responsible adult stay with you for the time you are told. It is important to have someone help care for you until you are awake and alert.  Rest as told. Do not drive or operate machinery. Do not drink alcohol or take sleeping pills.  Get help right away  if you have trouble breathing, or if you suddenly become confused. This information is not intended to replace advice given to you by your health care provider. Make sure you discuss any questions you have with your health care provider. Document Revised: 08/21/2020 Document Reviewed: 11/08/2019 Elsevier Patient Education  2021 [employer].

## 2021-02-04 ENCOUNTER — Other Ambulatory Visit: Payer: Self-pay

## 2021-02-04 ENCOUNTER — Other Ambulatory Visit (HOSPITAL_COMMUNITY)
Admission: RE | Admit: 2021-02-04 | Discharge: 2021-02-04 | Disposition: A | Discharge status: Home / Self Care | Payer: Medicare Other | Source: Ambulatory Visit | Attending: Gastroenterology | Admitting: Gastroenterology

## 2021-02-04 ENCOUNTER — Encounter (HOSPITAL_COMMUNITY)
Admission: RE | Admit: 2021-02-04 | Discharge: 2021-02-04 | Disposition: A | Discharge status: Home / Self Care | Payer: Medicare Other | Source: Ambulatory Visit | Attending: Gastroenterology | Admitting: Gastroenterology

## 2021-02-04 ENCOUNTER — Encounter (HOSPITAL_COMMUNITY): Payer: Self-pay

## 2021-02-04 DIAGNOSIS — Z20822 Contact with and (suspected) exposure to covid-19: Secondary | ICD-10-CM | POA: Diagnosis not present

## 2021-02-04 DIAGNOSIS — Z01818 Encounter for other preprocedural examination: Secondary | ICD-10-CM | POA: Diagnosis present

## 2021-02-04 HISTORY — DX: Sleep apnea, unspecified: G47.30

## 2021-02-04 HISTORY — DX: Personal history of other diseases of the digestive system: Z87.19

## 2021-02-04 LAB — BASIC METABOLIC PANEL
Anion gap: 9 (ref 5–15)
BUN: 17 mg/dL (ref 8–23)
CO2: 26 mmol/L (ref 22–32)
Calcium: 9.8 mg/dL (ref 8.9–10.3)
Chloride: 103 mmol/L (ref 98–111)
Creatinine, Ser: 0.64 mg/dL (ref 0.44–1.00)
GFR, Estimated: >60 mL/min (ref >60)
Glucose, Bld: 112 mg/dL — ABNORMAL HIGH (ref 70–99)
Potassium: 3.8 mmol/L (ref 3.5–5.1)
Sodium: 138 mmol/L (ref 135–145)

## 2021-02-04 LAB — SARS CORONAVIRUS 2 (TAT 6-24 HRS): SARS Coronavirus 2: NEGATIVE

## 2021-02-06 ENCOUNTER — Encounter (HOSPITAL_COMMUNITY): Payer: Self-pay | Admitting: Gastroenterology

## 2021-02-06 ENCOUNTER — Ambulatory Visit (HOSPITAL_COMMUNITY): Payer: Medicare Other | Admitting: Anesthesiology

## 2021-02-06 ENCOUNTER — Encounter (HOSPITAL_COMMUNITY)
Admission: RE | Disposition: A | Discharge status: Home / Self Care | Payer: Self-pay | Source: Home / Self Care | Attending: Gastroenterology

## 2021-02-06 ENCOUNTER — Other Ambulatory Visit: Payer: Self-pay

## 2021-02-06 ENCOUNTER — Ambulatory Visit (HOSPITAL_COMMUNITY)
Admission: RE | Admit: 2021-02-06 | Discharge: 2021-02-06 | Disposition: A | Discharge status: Home / Self Care | Payer: Medicare Other | Attending: Gastroenterology | Admitting: Gastroenterology

## 2021-02-06 DIAGNOSIS — I10 Essential (primary) hypertension: Secondary | ICD-10-CM | POA: Insufficient documentation

## 2021-02-06 DIAGNOSIS — Z8249 Family history of ischemic heart disease and other diseases of the circulatory system: Secondary | ICD-10-CM | POA: Diagnosis not present

## 2021-02-06 DIAGNOSIS — G4730 Sleep apnea, unspecified: Secondary | ICD-10-CM | POA: Diagnosis not present

## 2021-02-06 DIAGNOSIS — Z90710 Acquired absence of both cervix and uterus: Secondary | ICD-10-CM | POA: Diagnosis not present

## 2021-02-06 DIAGNOSIS — Q438 Other specified congenital malformations of intestine: Secondary | ICD-10-CM | POA: Insufficient documentation

## 2021-02-06 DIAGNOSIS — E119 Type 2 diabetes mellitus without complications: Secondary | ICD-10-CM | POA: Diagnosis not present

## 2021-02-06 DIAGNOSIS — Z881 Allergy status to other antibiotic agents status: Secondary | ICD-10-CM | POA: Diagnosis not present

## 2021-02-06 DIAGNOSIS — Z955 Presence of coronary angioplasty implant and graft: Secondary | ICD-10-CM | POA: Insufficient documentation

## 2021-02-06 DIAGNOSIS — Z8719 Personal history of other diseases of the digestive system: Secondary | ICD-10-CM | POA: Diagnosis not present

## 2021-02-06 DIAGNOSIS — Z87891 Personal history of nicotine dependence: Secondary | ICD-10-CM | POA: Insufficient documentation

## 2021-02-06 DIAGNOSIS — Z91041 Radiographic dye allergy status: Secondary | ICD-10-CM | POA: Diagnosis not present

## 2021-02-06 DIAGNOSIS — Z9049 Acquired absence of other specified parts of digestive tract: Secondary | ICD-10-CM | POA: Insufficient documentation

## 2021-02-06 DIAGNOSIS — Z79899 Other long term (current) drug therapy: Secondary | ICD-10-CM | POA: Insufficient documentation

## 2021-02-06 DIAGNOSIS — Z886 Allergy status to analgesic agent status: Secondary | ICD-10-CM | POA: Insufficient documentation

## 2021-02-06 DIAGNOSIS — M542 Cervicalgia: Secondary | ICD-10-CM | POA: Insufficient documentation

## 2021-02-06 DIAGNOSIS — K5730 Diverticulosis of large intestine without perforation or abscess without bleeding: Secondary | ICD-10-CM | POA: Insufficient documentation

## 2021-02-06 DIAGNOSIS — K648 Other hemorrhoids: Secondary | ICD-10-CM | POA: Diagnosis not present

## 2021-02-06 DIAGNOSIS — Z1211 Encounter for screening for malignant neoplasm of colon: Secondary | ICD-10-CM | POA: Diagnosis present

## 2021-02-06 DIAGNOSIS — I2510 Atherosclerotic heart disease of native coronary artery without angina pectoris: Secondary | ICD-10-CM | POA: Insufficient documentation

## 2021-02-06 DIAGNOSIS — E785 Hyperlipidemia, unspecified: Secondary | ICD-10-CM | POA: Insufficient documentation

## 2021-02-06 DIAGNOSIS — Z7982 Long term (current) use of aspirin: Secondary | ICD-10-CM | POA: Diagnosis not present

## 2021-02-06 DIAGNOSIS — H409 Unspecified glaucoma: Secondary | ICD-10-CM | POA: Insufficient documentation

## 2021-02-06 DIAGNOSIS — Z86010 Personal history of colonic polyps: Secondary | ICD-10-CM | POA: Diagnosis not present

## 2021-02-06 DIAGNOSIS — Z09 Encounter for follow-up examination after completed treatment for conditions other than malignant neoplasm: Secondary | ICD-10-CM | POA: Diagnosis not present

## 2021-02-06 DIAGNOSIS — Z9119 Patient's noncompliance with other medical treatment and regimen: Secondary | ICD-10-CM

## 2021-02-06 HISTORY — PX: ESOPHAGOGASTRODUODENOSCOPY (EGD) WITH PROPOFOL: SHX5813

## 2021-02-06 HISTORY — PX: COLONOSCOPY WITH PROPOFOL: SHX5780

## 2021-02-06 LAB — GLUCOSE, CAPILLARY: Glucose-Capillary: 124 mg/dL — ABNORMAL HIGH (ref 70–99)

## 2021-02-06 SURGERY — COLONOSCOPY WITH PROPOFOL
Anesthesia: General

## 2021-02-06 MED ORDER — LIDOCAINE VISCOUS HCL 2 % MT SOLN
15.0000 mL | Freq: Once | OROMUCOSAL | Status: AC
  Administered 2021-02-06: 15 mL via OROMUCOSAL

## 2021-02-06 MED ORDER — PROPOFOL 500 MG/50ML IV EMUL
INTRAVENOUS | Status: DC | PRN
  Administered 2021-02-06: 125 ug/kg/min via INTRAVENOUS

## 2021-02-06 MED ORDER — GLYCOPYRROLATE 0.2 MG/ML IJ SOLN
0.2000 mg | Freq: Once | INTRAMUSCULAR | Status: AC
  Administered 2021-02-06: 0.2 mg via INTRAVENOUS

## 2021-02-06 MED ORDER — LIDOCAINE HCL (CARDIAC) PF 100 MG/5ML IV SOSY
PREFILLED_SYRINGE | INTRAVENOUS | Status: DC | PRN
  Administered 2021-02-06: 50 mg via INTRAVENOUS

## 2021-02-06 MED ORDER — STERILE WATER FOR IRRIGATION IR SOLN
Status: DC | PRN
  Administered 2021-02-06: 200 mL

## 2021-02-06 MED ORDER — LIDOCAINE VISCOUS HCL 2 % MT SOLN
OROMUCOSAL | Status: AC
  Filled 2021-02-06: qty 15

## 2021-02-06 MED ORDER — LACTATED RINGERS IV SOLN
INTRAVENOUS | Status: DC
  Administered 2021-02-06: 1000 mL via INTRAVENOUS

## 2021-02-06 MED ORDER — PROPOFOL 10 MG/ML IV BOLUS
INTRAVENOUS | Status: DC | PRN
  Administered 2021-02-06: 80 mg via INTRAVENOUS
  Administered 2021-02-06: 10 mg via INTRAVENOUS

## 2021-02-06 MED ORDER — METOPROLOL TARTRATE 5 MG/5ML IV SOLN
INTRAVENOUS | Status: DC | PRN
  Administered 2021-02-06 (×2): 1 mg via INTRAVENOUS

## 2021-02-06 MED ORDER — GLYCOPYRROLATE 0.2 MG/ML IJ SOLN
INTRAMUSCULAR | Status: AC
  Filled 2021-02-06: qty 1

## 2021-02-06 NOTE — H&P (Signed)
Melinda Acevedo is an 80 y.o. female.   Chief Complaint: Neck pain and history of colon polyps HPI: 80 y.o. female with PMH CAD s/p stent placement, carotid artery occlusion, HLD, HTN, pre diabetes, glaucoma, who comes to the hospital for evaluation of neck pain and for surveillance of colonic polyps.  The patient reported that since September 2021 she had intermittent episodes of neck pain when she was exerting herself the last for a couple minutes.  She also has some lightheadedness when she presented these episodes but denied any symptoms such as heartburn, dysphagia, choking or vomiting.  She was evaluated by her cardiologist who ruled out any anginal equivalents with stress test and myocardial perfusion imaging.  Last EGD was performed 10 years ago, patient was found to have a small hiatal hernia.  The patient had her last colonoscopy 5 years ago per her report, she was found to have some polyps but does not remember how many but she reports that they were adenomas.  The patient denies having any complaints such as melena, hematochezia, abdominal pain or distention, change in her bowel movement consistency or frequency, no changes in her weight recently.  No family history of colorectal cancer.   Past Medical History:  Diagnosis Date  . CAD (coronary artery disease)   . Carotid artery occlusion   . Diabetes mellitus without complication ([place])   . Glaucoma   . History of hiatal hernia   . Hyperlipidemia   . Hypertension   . Irregular heart beats   . Seasonal allergies   . Sleep apnea     Past Surgical History:  Procedure Laterality Date  . ABDOMINAL HYSTERECTOMY    . CATARACT EXTRACTION Bilateral   . CHOLECYSTECTOMY    . COLONOSCOPY  2017   Dr.Williams in [place] , [place]  . heart catherization  [id number], 2010  . RECTOCELE REPAIR    . TONSILLECTOMY    . TUBAL LIGATION  1986  . UPPER GASTROINTESTINAL ENDOSCOPY  2015   Dr.Williams in [place], [place]  . VAGINAL HYSTERECTOMY  1991     Family History  Problem Relation Age of Onset  . COPD Mother   . Heart disease Mother   . Heart disease Father   . Heart attack Father   . Hypertension Sister   . Mitral valve prolapse Brother    Social History:  reports that she quit smoking about 58 years ago. Her smoking use included cigarettes. She quit after 2.00 years of use. She has never used smokeless tobacco. She reports that she does not drink alcohol and does not use drugs.  Allergies:  Allergies  Allergen Reactions  . Contrast Media [Iodinated Diagnostic Agents] Shortness Of Breath  . Keflex [Cephalexin] Hives  . Naproxen Other (See Comments)    Stomach upset    Medications Prior to Admission  Medication Sig Dispense Refill  . amLODipine (NORVASC) 5 MG tablet Take 5 mg by mouth daily.    Marland Kitchen aspirin 81 MG tablet Take 81 mg by mouth daily.    . benazepril (LOTENSIN) 10 MG tablet Take 10 mg by mouth 2 (two) times daily.    Marland Kitchen BLACK ELDERBERRY PO Take 1 each by mouth daily.    . Cholecalciferol (VITAMIN D) 125 MCG (5000 UT) CAPS Take 5,000 Units by mouth daily.     . Coenzyme Q10 (COQ10) 100 MG CAPS Take 200 mg by mouth daily.    Marland Kitchen loratadine (CLARITIN) 10 MG tablet Take 10 mg by mouth daily.    Marland Kitchen  Multiple Vitamins-Minerals (MULTIVITAMIN ADULT PO) Take 1 tablet by mouth daily.    . nitrofurantoin, macrocrystal-monohydrate, (MACROBID) 100 MG capsule Take 100 mg by mouth 2 (two) times daily as needed (UTI).    . RABEprazole (ACIPHEX) 20 MG tablet Take 20 mg by mouth daily.    . rosuvastatin (CRESTOR) 5 MG tablet Take 5 mg by mouth daily.    . Travoprost, BAK Free, (TRAVATAN) 0.004 % SOLN ophthalmic solution Place 1 drop into both eyes at bedtime.    . nitroGLYCERIN (NITROSTAT) 0.4 MG SL tablet Place 0.4 mg under the tongue every 5 (five) minutes as needed for chest pain.      Results for orders placed or performed during the hospital encounter of 02/04/21 (from the past 48 hour(s))  SARS CORONAVIRUS 2 (TAT 6-24 HRS)  Nasopharyngeal Nasopharyngeal Swab     Status: None   Collection Time: 02/04/21 11:15 AM   Specimen: Nasopharyngeal Swab  Result Value Ref Range   SARS Coronavirus 2 NEGATIVE NEGATIVE    Comment: (NOTE) SARS-CoV-2 target nucleic acids are NOT DETECTED.  The SARS-CoV-2 RNA is generally detectable in upper and lower respiratory specimens during the acute phase of infection. Negative results do not preclude SARS-CoV-2 infection, do not rule out co-infections with other pathogens, and should not be used as the sole basis for treatment or other patient management decisions. Negative results must be combined with clinical observations, patient history, and epidemiological information. The expected result is Negative.  Fact Sheet for Patients: SugarRoll.be  Fact Sheet for Healthcare Providers: https://www.woods-mathews.com/  This test is not yet approved or cleared by the [place] FDA and  has been authorized for detection and/or diagnosis of SARS-CoV-2 by FDA under an Emergency Use Authorization (EUA). This EUA will remain  in effect (meaning this test can be used) for the duration of the COVID-19 declaration under Se ction 564(b)(1) of the Act, 21 U.S.C. section 360bbb-3(b)(1), unless the authorization is terminated or revoked sooner.  Performed at [facility], [place] [address], [place], [place] [address]   Basic metabolic panel     Status: Abnormal   Collection Time: 02/04/21 11:19 AM  Result Value Ref Range   Sodium 138 135 - 145 mmol/L   Potassium 3.8 3.5 - 5.1 mmol/L   Chloride 103 98 - 111 mmol/L   CO2 26 22 - 32 mmol/L   Glucose, Bld 112 (H) 70 - 99 mg/dL    Comment: Glucose reference range applies only to samples taken after fasting for at least 8 hours.   BUN 17 8 - 23 mg/dL   Creatinine, Ser 0.64 0.44 - 1.00 mg/dL   Calcium 9.8 8.9 - 10.3 mg/dL   GFR, Estimated >60 >60 mL/min    Comment: (NOTE) Calculated using the  CKD-EPI Creatinine Equation (2021)    Anion gap 9 5 - 15    Comment: Performed at [facility], [address], [place], [place] [address]   No results found.  Review of Systems  Constitutional: Negative.   HENT: Negative.   Eyes: Negative.   Respiratory: Negative.   Cardiovascular: Negative.   Gastrointestinal: Negative.   Endocrine: Negative.   Genitourinary: Negative.   Musculoskeletal: Negative.   Skin: Negative.   Allergic/Immunologic: Negative.   Neurological: Negative.   Hematological: Negative.   Psychiatric/Behavioral: Negative.     Blood pressure (!) 157/75, pulse 84, temperature 98.4 F (36.9 C), temperature source Oral, resp. rate 12, height 5' (1.524 m), weight 72.6 kg, SpO2 98 %. Physical  Exam  GENERAL: The patient is AO x3, in no acute distress. HEENT: Head is normocephalic and atraumatic. EOMI are intact. Mouth is well hydrated and without lesions. NECK: Supple. No masses LUNGS: Clear to auscultation. No presence of rhonchi/wheezing/rales. Adequate chest expansion HEART: RRR, normal s1 and s2. ABDOMEN: Soft, nontender, no guarding, no peritoneal signs, and nondistended. BS +. No masses. EXTREMITIES: Without any cyanosis, clubbing, rash, lesions or edema. NEUROLOGIC: AOx3, no focal motor deficit. SKIN: no jaundice, no rashes  Assessment/Plan 80 y.o. female with PMH CAD s/p stent placement, carotid artery occlusion, HLD, HTN, pre diabetes, glaucoma, who comes to the hospital for evaluation of neck pain and for surveillance of colonic polyps.  We'll proceed with EGD and colonoscopy.  Harvel Quale, MD 02/06/2021, 9:44 AM

## 2021-02-06 NOTE — Transfer of Care (Signed)
Immediate Anesthesia Transfer of Care Note  Patient: Melinda Acevedo  Procedure(s) Performed: COLONOSCOPY WITH PROPOFOL (N/A ) ESOPHAGOGASTRODUODENOSCOPY (EGD) WITH PROPOFOL (N/A )  Patient Location: PACU  Anesthesia Type:General  Level of Consciousness: awake, alert  and oriented  Airway & Oxygen Therapy: Patient Spontanous Breathing  Post-op Assessment: Report given to RN and Post -op Vital signs reviewed and stable  Post vital signs: Reviewed and stable  Last Vitals:  Vitals Value Taken Time  BP    Temp    Pulse    Resp    SpO2      Last Pain:  Vitals:   02/06/21 0955  TempSrc:   PainSc: 0-No pain      Patients Stated Pain Goal: 8 (04/59/13 6859)  Complications: No complications documented.

## 2021-02-06 NOTE — Op Note (Signed)
[facility] Patient Name: Melinda Acevedo Procedure Date: 02/06/2021 9:31 AM MRN: [id number] Date of Birth: 02/25/1941 Attending MD: Maylon Peppers ,  CSN: [id number] Age: 80 Admit Type: Outpatient Procedure:                Upper GI endoscopy Indications:              Neck pain Providers:                Maylon Peppers, Crystal Page, Aram Candela Referring MD:              Medicines:                Monitored Anesthesia Care Complications:            No immediate complications. Estimated Blood Loss:     Estimated blood loss: none. Procedure:                Pre-Anesthesia Assessment:                           - Prior to the procedure, a History and Physical                            was performed, and patient medications, allergies                            and sensitivities were reviewed. The patient's                            tolerance of previous anesthesia was reviewed.                           - The risks and benefits of the procedure and the                            sedation options and risks were discussed with the                            patient. All questions were answered and informed                            consent was obtained.                           - ASA Grade Assessment: III - A patient with severe                            systemic disease.                           After obtaining informed consent, the endoscope was                            passed under direct vision. Throughout the                            procedure, the patient's blood pressure, pulse, and  oxygen saturations were monitored continuously. The                            [id number]-[id number] ([id number]) scope was introduced through the                            mouth, and advanced to the second part of duodenum.                            The upper GI endoscopy was accomplished without                            difficulty. The patient tolerated the procedure                             well. Scope In: 9:59:44 AM Scope Out: 10:03:48 AM Total Procedure Duration: 0 hours 4 minutes 4 seconds  Findings:      The nasopharynx was normal.      The examined esophagus was normal.      The entire examined stomach was normal.      The examined duodenum was normal. Impression:               - Normal nasopharynx.                           - Normal esophagus.                           - Normal stomach.                           - Normal examined duodenum.                           - No specimens collected. Moderate Sedation:      Per Anesthesia Care Recommendation:           - Discharge patient to home (ambulatory).                           - Resume previous diet. Procedure Code(s):        --- Professional ---                           [phone], Esophagogastroduodenoscopy, flexible,                            transoral; diagnostic, including collection of                            specimen(s) by brushing or washing, when performed                            (separate procedure) Diagnosis Code(s):        --- Professional ---                           M54.2, Cervicalgia  CPT copyright 2019 American Medical Association. All rights reserved. The codes documented in this report are preliminary and upon coder review may  be revised to meet current compliance requirements. Maylon Peppers, MD Maylon Peppers,  02/06/2021 10:41:09 AM This report has been signed electronically. Number of Addenda: 0

## 2021-02-06 NOTE — Anesthesia Preprocedure Evaluation (Signed)
Anesthesia Evaluation  Patient identified by MRN, date of birth, ID band Patient awake    Reviewed: Allergy & Precautions, NPO status , Patient's Chart, lab work & pertinent test results  Airway Mallampati: II  TM Distance: >3 FB Neck ROM: Full    Dental  (+) Dental Advisory Given, Teeth Intact   Pulmonary sleep apnea , former smoker,    Pulmonary exam normal breath sounds clear to auscultation       Cardiovascular hypertension, Pt. on medications + CAD and + Cardiac Stents  Normal cardiovascular exam Rhythm:Regular Rate:Normal     Neuro/Psych Anxiety    GI/Hepatic hiatal hernia, GERD  ,  Endo/Other  diabetes, Well Controlled, Type 2Hypothyroidism   Renal/GU      Musculoskeletal negative musculoskeletal ROS (+)   Abdominal   Peds  Hematology   Anesthesia Other Findings   Reproductive/Obstetrics negative OB ROS                             Anesthesia Physical Anesthesia Plan  ASA: III  Anesthesia Plan: General   Post-op Pain Management:    Induction: Intravenous  PONV Risk Score and Plan: TIVA  Airway Management Planned: Nasal Cannula and Natural Airway  Additional Equipment:   Intra-op Plan:   Post-operative Plan:   Informed Consent: I have reviewed the patients History and Physical, chart, labs and discussed the procedure including the risks, benefits and alternatives for the proposed anesthesia with the patient or authorized representative who has indicated his/her understanding and acceptance.     Dental advisory given  Plan Discussed with: CRNA and Surgeon  Anesthesia Plan Comments:         Anesthesia Quick Evaluation

## 2021-02-06 NOTE — Op Note (Signed)
[facility] Patient Name: Melinda Acevedo Fake Procedure Date: 02/06/2021 10:05 AM MRN: [id number] Date of Birth: 01-01-1941 Attending MD: Maylon Peppers ,  CSN: [id number] Age: 80 Admit Type: Outpatient Procedure:                Colonoscopy Indications:              High risk colon cancer surveillance: Personal                            history of colonic polyps Providers:                Maylon Peppers, Crystal Page, Aram Candela Referring MD:              Medicines:                Monitored Anesthesia Care Complications:            No immediate complications. Estimated Blood Loss:     Estimated blood loss: none. Procedure:                Pre-Anesthesia Assessment:                           - Prior to the procedure, a History and Physical                            was performed, and patient medications, allergies                            and sensitivities were reviewed. The patient's                            tolerance of previous anesthesia was reviewed.                           - The risks and benefits of the procedure and the                            sedation options and risks were discussed with the                            patient. All questions were answered and informed                            consent was obtained.                           - ASA Grade Assessment: III - A patient with severe                            systemic disease.                           After obtaining informed consent, the colonoscope                            was passed under direct vision. Throughout the  procedure, the patient's blood pressure, pulse, and                            oxygen saturations were monitored continuously. The                            [id number]-HQ190L ([id number]) scope was introduced through                            the anus and advanced to the the cecum, identified                            by appendiceal orifice and ileocecal valve.  The                            colonoscopy was technically difficult and complex                            due to inadequate bowel prep. The patient tolerated                            the procedure well. Scope In: 10:07:43 AM Scope Out: 10:32:24 AM Scope Withdrawal Time: 0 hours 11 minutes 7 seconds  Total Procedure Duration: 0 hours 24 minutes 41 seconds  Findings:      The perianal and digital rectal examinations were normal.      Extensive amounts of stool was found at the hepatic flexure, in the       ascending colon and in the cecum, precluding visualization.      Multiple small and large-mouthed diverticula were found in the sigmoid       colon and descending colon.      The sigmoid colon was moderately tortuous.      Non-bleeding internal hemorrhoids were found during retroflexion. The       hemorrhoids were mild. Impression:               - Stool at the hepatic flexure, in the ascending                            colon and in the cecum.                           - Diverticulosis in the sigmoid colon and in the                            descending colon.                           - Tortuous colon.                           - Non-bleeding internal hemorrhoids.                           - No specimens collected. Moderate Sedation:      Per Anesthesia Care Recommendation:           -  Discharge patient to home (ambulatory).                           - Resume previous diet.                           - Repeat colonoscopy in 1 year for surveillance due                            to poor prep. Will need a 2 day prep. Procedure Code(s):        --- Professional ---                           M3536, Colorectal cancer screening; colonoscopy on                            individual at high risk Diagnosis Code(s):        --- Professional ---                           Z86.010, Personal history of colonic polyps                           K64.8, Other hemorrhoids                            K57.30, Diverticulosis of large intestine without                            perforation or abscess without bleeding                           Q43.8, Other specified congenital malformations of                            intestine CPT copyright 2019 American Medical Association. All rights reserved. The codes documented in this report are preliminary and upon coder review may  be revised to meet current compliance requirements. Maylon Peppers, MD Maylon Peppers,  02/06/2021 10:49:23 AM This report has been signed electronically. Number of Addenda: 0

## 2021-02-06 NOTE — Discharge Instructions (Signed)
You are being discharged to home.  Resume your previous diet.   Repeat colonoscopy in 1 year for surveillance due to poor prep. Will need a 2 day prep.    Upper Endoscopy, Adult, Care After This sheet gives you information about how to care for yourself after your procedure. Your health care provider may also give you more specific instructions. If you have problems or questions, contact your health care provider. What can I expect after the procedure? After the procedure, it is common to have:  A sore throat.  Mild stomach pain or discomfort.  Bloating.  Nausea. Follow these instructions at home:  Follow instructions from your health care provider about what to eat or drink after your procedure.  Return to your normal activities as told by your health care provider. Ask your health care provider what activities are safe for you.  Take over-the-counter and prescription medicines only as told by your health care provider.  If you were given a sedative during the procedure, it can affect you for several hours. Do not drive or operate machinery until your health care provider says that it is safe.  Keep all follow-up visits as told by your health care provider. This is important.   Contact a health care provider if you have:  A sore throat that lasts longer than one day.  Trouble swallowing. Get help right away if:  You vomit blood or your vomit looks like coffee grounds.  You have: ? A fever. ? Bloody, black, or tarry stools. ? A severe sore throat or you cannot swallow. ? Difficulty breathing. ? Severe pain in your chest or abdomen. Summary  After the procedure, it is common to have a sore throat, mild stomach discomfort, bloating, and nausea.  If you were given a sedative during the procedure, it can affect you for several hours. Do not drive or operate machinery until your health care provider says that it is safe.  Follow instructions from your health care provider  about what to eat or drink after your procedure.  Return to your normal activities as told by your health care provider. This information is not intended to replace advice given to you by your health care provider. Make sure you discuss any questions you have with your health care provider. Document Revised: 12/04/2019 Document Reviewed: 05/08/2018 Elsevier Patient Education  2021 [place].   Colonoscopy, Adult, Care After This sheet gives you information about how to care for yourself after your procedure. Your doctor may also give you more specific instructions. If you have problems or questions, call your doctor. What can I expect after the procedure? After the procedure, it is common to have:  A small amount of blood in your poop (stool) for 24 hours.  Some gas.  Mild cramping or bloating in your belly (abdomen). Follow these instructions at home: Eating and drinking  Drink enough fluid to keep your pee (urine) pale yellow.  Follow instructions from your doctor about what you cannot eat or drink.  Return to your normal diet as told by your doctor. Avoid heavy or fried foods that are hard to digest.   Activity  Rest as told by your doctor.  Do not sit for a long time without moving. Get up to take short walks every 1-2 hours. This is important. Ask for help if you feel weak or unsteady.  Return to your normal activities as told by your doctor. Ask your doctor what activities are safe for you. To  help cramping and bloating:  Try walking around.  Put heat on your belly as told by your doctor. Use the heat source that your doctor recommends, such as a moist heat pack or a heating pad. ? Put a towel between your skin and the heat source. ? Leave the heat on for 20-30 minutes. ? Remove the heat if your skin turns bright red. This is very important if you are unable to feel pain, heat, or cold. You may have a greater risk of getting burned.   General instructions  If you  were given a medicine to help you relax (sedative) during your procedure, it can affect you for many hours. Do not drive or use machinery until your doctor says that it is safe.  For the first 24 hours after the procedure: ? Do not sign important documents. ? Do not drink alcohol. ? Do your daily activities more slowly than normal. ? Eat foods that are soft and easy to digest.  Take over-the-counter or prescription medicines only as told by your doctor.  Keep all follow-up visits as told by your doctor. This is important. Contact a doctor if:  You have blood in your poop 2-3 days after the procedure. Get help right away if:  You have more than a small amount of blood in your poop.  You see large clumps of tissue (blood clots) in your poop.  Your belly is swollen.  You feel like you may vomit (nauseous).  You vomit.  You have a fever.  You have belly pain that gets worse, and medicine does not help your pain. Summary  After the procedure, it is common to have a small amount of blood in your poop. You may also have mild cramping and bloating in your belly.  If you were given a medicine to help you relax (sedative) during your procedure, it can affect you for many hours. Do not drive or use machinery until your doctor says that it is safe.  Get help right away if you have a lot of blood in your poop, feel like you may vomit, have a fever, or have more belly pain. This information is not intended to replace advice given to you by your health care provider. Make sure you discuss any questions you have with your health care provider. Document Revised: 10/12/2019 Document Reviewed: 07/02/2019 Elsevier Patient Education  [place].

## 2021-02-06 NOTE — Anesthesia Postprocedure Evaluation (Signed)
Anesthesia Post Note  Patient: Melinda Acevedo  Procedure(s) Performed: COLONOSCOPY WITH PROPOFOL (N/A ) ESOPHAGOGASTRODUODENOSCOPY (EGD) WITH PROPOFOL (N/A )  Patient location during evaluation: Phase II Anesthesia Type: General Level of consciousness: awake and alert and oriented Pain management: satisfactory to patient Vital Signs Assessment: post-procedure vital signs reviewed and stable Respiratory status: spontaneous breathing and respiratory function stable Cardiovascular status: stable and blood pressure returned to baseline Postop Assessment: no apparent nausea or vomiting and adequate PO intake Anesthetic complications: no   No complications documented.   Last Vitals:  Vitals:   02/06/21 0938  BP: (!) 157/75  Pulse: 84  Resp: 12  Temp: 36.9 C  SpO2: 98%    Last Pain:  Vitals:   02/06/21 0955  TempSrc:   PainSc: 0-No pain                 Karna Dupes

## 2021-02-09 NOTE — Progress Notes (Signed)
Pt returned call. Doing well. Np problems.

## 2021-02-10 ENCOUNTER — Encounter: Payer: Self-pay | Admitting: Cardiology

## 2021-02-10 ENCOUNTER — Ambulatory Visit (INDEPENDENT_AMBULATORY_CARE_PROVIDER_SITE_OTHER): Payer: Medicare Other | Admitting: Cardiology

## 2021-02-10 ENCOUNTER — Encounter: Payer: Self-pay | Admitting: *Deleted

## 2021-02-10 VITALS — BP 158/62 | HR 78 | Ht 60.0 in | Wt 164.0 lb

## 2021-02-10 DIAGNOSIS — I6529 Occlusion and stenosis of unspecified carotid artery: Secondary | ICD-10-CM

## 2021-02-10 DIAGNOSIS — I2510 Atherosclerotic heart disease of native coronary artery without angina pectoris: Secondary | ICD-10-CM

## 2021-02-10 DIAGNOSIS — R0789 Other chest pain: Secondary | ICD-10-CM | POA: Diagnosis not present

## 2021-02-10 DIAGNOSIS — I10 Essential (primary) hypertension: Secondary | ICD-10-CM | POA: Diagnosis not present

## 2021-02-10 MED ORDER — BENAZEPRIL HCL 20 MG PO TABS: 20.0000 mg | ORAL_TABLET | Freq: Two times a day (BID) | ORAL | 1 refills | Status: DC

## 2021-02-10 NOTE — Progress Notes (Signed)
Clinical Summary Melinda Acevedo is a 80 y.o.female seen today as a new patient for the following medical problems.   1. HTN - compliant with meds - homes SBPs 140s - 10mg  of norvasc causes swelling - prior bradycardia on beta blocker   2. Hyperlipidemia - compliant with crestor  3. OSA   4. Neck pain/SOB/History of CAD - history of prior stent to LAD in 2010 per patient report - recent exertional symptoms - 09/2020 nuclear stress [place]: exercised 3 min, chest pain and fatigue with exercise, no EKG ishcemic changes. No ischemia by imaging.   - symptoms started in 08/2020 - walks out mailbox and back, SOB/DOE, +palpitations, pressure both sides of neck similar to prior stent. Rested and symptoms resolve within 1 minute - episodes continued to occur. Last episode 1 month ago.     5. Carotid stenosis - followed by vascular    Daughter worked [name] worked at [employer], now lives at [facility]    Past Medical History:  Diagnosis Date  . CAD (coronary artery disease)   . Carotid artery occlusion   . Diabetes mellitus without complication ([place])   . Glaucoma   . History of hiatal hernia   . Hyperlipidemia   . Hypertension   . Irregular heart beats   . Seasonal allergies   . Sleep apnea      Allergies  Allergen Reactions  . Contrast Media [Iodinated Diagnostic Agents] Shortness Of Breath  . Keflex [Cephalexin] Hives  . Naproxen Other (See Comments)    Stomach upset     Current Outpatient Medications  Medication Sig Dispense Refill  . amLODipine (NORVASC) 5 MG tablet Take 5 mg by mouth daily.    Marland Kitchen aspirin 81 MG tablet Take 81 mg by mouth daily.    . benazepril (LOTENSIN) 10 MG tablet Take 10 mg by mouth 2 (two) times daily.    Marland Kitchen BLACK ELDERBERRY PO Take 1 each by mouth daily.    . Cholecalciferol (VITAMIN D) 125 MCG (5000 UT) CAPS Take 5,000 Units by mouth daily.     . Coenzyme Q10 (COQ10) 100 MG CAPS Take 200 mg by mouth daily.    Marland Kitchen  loratadine (CLARITIN) 10 MG tablet Take 10 mg by mouth daily.    . Multiple Vitamins-Minerals (MULTIVITAMIN ADULT PO) Take 1 tablet by mouth daily.    . nitrofurantoin, macrocrystal-monohydrate, (MACROBID) 100 MG capsule Take 100 mg by mouth 2 (two) times daily as needed (UTI).    . nitroGLYCERIN (NITROSTAT) 0.4 MG SL tablet Place 0.4 mg under the tongue every 5 (five) minutes as needed for chest pain.    . RABEprazole (ACIPHEX) 20 MG tablet Take 20 mg by mouth daily.    . rosuvastatin (CRESTOR) 5 MG tablet Take 5 mg by mouth daily.    . Travoprost, BAK Free, (TRAVATAN) 0.004 % SOLN ophthalmic solution Place 1 drop into both eyes at bedtime.     No current facility-administered medications for this visit.     Past Surgical History:  Procedure Laterality Date  . ABDOMINAL HYSTERECTOMY    . CATARACT EXTRACTION Bilateral   . CHOLECYSTECTOMY    . COLONOSCOPY  2017   Dr.Williams in [place] , [place]  . heart catherization  [id number], 2010  . RECTOCELE REPAIR    . TONSILLECTOMY    . TUBAL LIGATION  1986  . UPPER GASTROINTESTINAL ENDOSCOPY  2015   Dr.Williams in [place], [place]  Allergies  Allergen Reactions  . Contrast Media [Iodinated Diagnostic Agents] Shortness Of Breath  . Keflex [Cephalexin] Hives  . Naproxen Other (See Comments)    Stomach upset      Family History  Problem Relation Age of Onset  . COPD Mother   . Heart disease Mother   . Heart disease Father   . Heart attack Father   . Hypertension Sister   . Mitral valve prolapse Brother      Social History Ms. Devora reports that she quit smoking about 58 years ago. Her smoking use included cigarettes. She quit after 2.00 years of use. She has never used smokeless tobacco. Ms. Jalomo reports no history of alcohol use.   Review of Systems CONSTITUTIONAL: No weight loss, fever, chills, weakness or fatigue.  HEENT: Eyes: No visual loss, blurred vision, double vision or yellow  sclerae.No hearing loss, sneezing, congestion, runny nose or sore throat.  SKIN: No rash or itching.  CARDIOVASCULAR: per hpi RESPIRATORY: No shortness of breath, cough or sputum.  GASTROINTESTINAL: No anorexia, nausea, vomiting or diarrhea. No abdominal pain or blood.  GENITOURINARY: No burning on urination, no polyuria NEUROLOGICAL: No headache, dizziness, syncope, paralysis, ataxia, numbness or tingling in the extremities. No change in bowel or bladder control.  MUSCULOSKELETAL: No muscle, back pain, joint pain or stiffness.  LYMPHATICS: No enlarged nodes. No history of splenectomy.  PSYCHIATRIC: No history of depression or anxiety.  ENDOCRINOLOGIC: No reports of sweating, cold or heat intolerance. No polyuria or polydipsia.  Marland Kitchen   Physical Examination Today's Vitals   02/10/21 1043  BP: (!) 158/62  Pulse: 78  SpO2: 96%  Weight: 164 lb (74.4 kg)  Height: 5' (1.524 m)   Body mass index is 32.03 kg/m.  Gen: resting comfortably, no acute distress HEENT: no scleral icterus, pupils equal round and reactive, no palptable cervical adenopathy,  CV: RRR, no m/r/g no jvd Resp: Clear to auscultation bilaterally GI: abdomen is soft, non-tender, non-distended, normal bowel sounds, no hepatosplenomegaly MSK: extremities are warm, no edema.  Skin: warm, no rash Neuro:  no focal deficits Psych: appropriate affect    Assessment and Plan  1. CAD - recent exertional symptoms. Somewhat atypical as primarily necks pain with exertion with associated SOB/DOE. Nuclear stress test in [employer] did not show ischemia - symptoms have since resolved. Monitor at this time, if recurrent would consider trial of imdur +/- cath. - request echo from Dr Ammie Ferrier office she reprots was done in August EKG today shows NSR  2. HTN - above goal, increase benzepril to 20mg  bid, recheck bmet 2 weeks - call with bp's in 2 weeks         Arnoldo Lenis, M.D.

## 2021-02-10 NOTE — Patient Instructions (Signed)
Your physician recommends that you schedule a follow-up appointment in: [place] has recommended you make the following change in your medication:   INCREASE BENAZEPRIL 20 MG TWICE DAILY   Your physician recommends that you return for lab work in: [place] 2 WEEKS AND CALL us WITH READINGS - PLEASE CHECK AT THE SAME TIME DAILY AFTER SITTING FOR 15 MINS AND AT [place].  Thank you for choosing [facility]!!

## 2021-02-11 ENCOUNTER — Encounter (HOSPITAL_COMMUNITY): Payer: Self-pay | Admitting: Gastroenterology

## 2021-02-25 ENCOUNTER — Ambulatory Visit (INDEPENDENT_AMBULATORY_CARE_PROVIDER_SITE_OTHER): Payer: Medicare Other | Admitting: Physician Assistant

## 2021-02-25 ENCOUNTER — Other Ambulatory Visit: Payer: Self-pay

## 2021-02-25 ENCOUNTER — Telehealth: Payer: Self-pay | Admitting: Cardiology

## 2021-02-25 ENCOUNTER — Ambulatory Visit (HOSPITAL_COMMUNITY)
Admission: RE | Admit: 2021-02-25 | Discharge: 2021-02-25 | Disposition: A | Discharge status: Home / Self Care | Payer: Medicare Other | Source: Ambulatory Visit | Attending: Vascular Surgery | Admitting: Vascular Surgery

## 2021-02-25 VITALS — BP 132/61 | HR 62 | Temp 98.7°F | Resp 20 | Ht 60.0 in | Wt 160.2 lb

## 2021-02-25 DIAGNOSIS — I6529 Occlusion and stenosis of unspecified carotid artery: Secondary | ICD-10-CM | POA: Insufficient documentation

## 2021-02-25 DIAGNOSIS — M542 Cervicalgia: Secondary | ICD-10-CM | POA: Diagnosis not present

## 2021-02-25 NOTE — Telephone Encounter (Signed)
BP's somewhat up and down but improving, would not make any additional changes   Zandra Abts MD

## 2021-02-25 NOTE — Telephone Encounter (Signed)
blood pressure readings  02/11/21 134/72 at 3:20 02/12/10 did not check 02/13/21 141/72 at 5:10pm 02/14/21 115/59 at 3:34pm 02/15/21 did not check 02/16/21 137/61 at 3:35pm 02/17/21 107/60 at 3:40pm 02/18/21 141/69 at 3:35pm 02/19/21 148/73 at 5:25pm 02/20/21 146/69 at 3:58pm 02/21/21 128/57 at 3:40pm 02/22/21 128/57 at 3:33pm 02/23/21 115/61 at 3:40pm 02/24/21 111/65 at 3:35pm

## 2021-02-25 NOTE — Progress Notes (Signed)
Office Note     CC:  follow up Requesting Provider:  Sherrilee Gilles, DO  HPI: Steele Ledonne is a 80 y.o. (November 30, 1941) female who presents for evaluation of bilateral anterior neck pain.  She was last seen in office in January 2021 for evaluation of carotid artery disease.  At that time bilateral ICA stenosis was estimated to be 1 to 39% and Dr. Donzetta Matters recommended repeat carotid duplex in 5 years.  Patient has developed anterior bilateral neck pain and tightness that initially occurred during exertion however can occur at rest.  She states she felt the same sensation during a prior cardiac catheterization when balloon was blown up during coronary angioplasty.  She has had a nuclear stress test which has been negative.  Her cardiologist is considering repeat left heart catheterization if carotid duplex is negative.  She is also had upper endoscopy which was also negative.  She denies tobacco use.  She also denies any strokelike symptoms including slurring speech, changes in vision, or one-sided weakness.   Past Medical History:  Diagnosis Date  . CAD (coronary artery disease)   . Carotid artery occlusion   . Diabetes mellitus without complication ([place])   . Glaucoma   . History of hiatal hernia   . Hyperlipidemia   . Hypertension   . Irregular heart beats   . Seasonal allergies   . Sleep apnea     Past Surgical History:  Procedure Laterality Date  . ABDOMINAL HYSTERECTOMY    . CATARACT EXTRACTION Bilateral   . CHOLECYSTECTOMY    . COLONOSCOPY  2017   Dr.Williams in [place] , [place]  . COLONOSCOPY WITH PROPOFOL N/A 02/06/2021   Procedure: COLONOSCOPY WITH PROPOFOL;  Surgeon: Harvel Quale, MD;  [facility];  Service: Gastroenterology;  Laterality: N/A;  10:30  . ESOPHAGOGASTRODUODENOSCOPY (EGD) WITH PROPOFOL N/A 02/06/2021   Procedure: ESOPHAGOGASTRODUODENOSCOPY (EGD) WITH PROPOFOL;  Surgeon: Harvel Quale, MD;  Location: [facility];  Service:  Gastroenterology;  Laterality: N/A;  . heart catherization  [id number], 2010  . RECTOCELE REPAIR    . TONSILLECTOMY    . TUBAL LIGATION  1986  . UPPER GASTROINTESTINAL ENDOSCOPY  2015   Dr.Williams in [place], [place]    Social History   Socioeconomic History  . Marital status: Unknown    Spouse name: Not on file  . Number of children: Not on file  . Years of education: Not on file  . Highest education level: Not on file  Occupational History  . Not on file  Tobacco Use  . Smoking status: Former Smoker    Years: 2.00    Types: Cigarettes    Quit date: 1964    Years since quitting: 58.2  . Smokeless tobacco: Never Used  Vaping Use  . Vaping Use: Never used  Substance and Sexual Activity  . Alcohol use: No  . Drug use: No  . Sexual activity: Not on file  Other Topics Concern  . Not on file  Social History Narrative  . Not on file   Social Determinants of Health   Financial Resource Strain: Not on file  Food Insecurity: Not on file  Transportation Needs: Not on file  Physical Activity: Not on file  Stress: Not on file  Social Connections: Not on file  Intimate Partner Violence: Not on file    Family History  Problem Relation Age of Onset  . COPD Mother   . Heart disease Mother   .  Heart disease Father   . Heart attack Father   . Hypertension Sister   . Mitral valve prolapse Brother     Current Outpatient Medications  Medication Sig Dispense Refill  . amLODipine (NORVASC) 5 MG tablet Take 5 mg by mouth daily.    Marland Kitchen aspirin 81 MG tablet Take 81 mg by mouth daily.    . benazepril (LOTENSIN) 20 MG tablet Take 1 tablet (20 mg total) by mouth 2 (two) times daily. (Patient taking differently: Take 40 mg by mouth 2 (two) times daily.) 180 tablet 1  . BLACK ELDERBERRY PO Take 1 each by mouth daily.    . Cholecalciferol (VITAMIN D) 125 MCG (5000 UT) CAPS Take 5,000 Units by mouth daily.     . Coenzyme Q10 (COQ10) 100 MG CAPS Take 200 mg by  mouth daily.    Marland Kitchen loratadine (CLARITIN) 10 MG tablet Take 10 mg by mouth daily.    . Menthol, Topical Analgesic, (BIOFREEZE EX) Apply topically.    . Multiple Vitamins-Minerals (MULTIVITAMIN ADULT PO) Take 1 tablet by mouth daily.    . nitrofurantoin, macrocrystal-monohydrate, (MACROBID) 100 MG capsule Take 100 mg by mouth 2 (two) times daily as needed (UTI).    . nitroGLYCERIN (NITROSTAT) 0.4 MG SL tablet Place 0.4 mg under the tongue every 5 (five) minutes as needed for chest pain.    Marland Kitchen ondansetron (ZOFRAN) 4 MG tablet Take 4 mg by mouth every 8 (eight) hours as needed.    . RABEprazole (ACIPHEX) 20 MG tablet Take 20 mg by mouth daily.    . rosuvastatin (CRESTOR) 5 MG tablet Take 5 mg by mouth daily.    . Travoprost, BAK Free, (TRAVATAN) 0.004 % SOLN ophthalmic solution Place 1 drop into both eyes at bedtime.     No current facility-administered medications for this visit.    Allergies  Allergen Reactions  . Contrast Media [Iodinated Diagnostic Agents] Shortness Of Breath  . Alendronate   . Cinoxacin Hives  . Diltiazem   . Keflex [Cephalexin] Hives  . Naproxen Other (See Comments)    Stomach upset  . Nifedipine   . Norfloxacin   . Nsaids   . Other   . Pravastatin Sodium   . Trimethoprim   . Valdecoxib      REVIEW OF SYSTEMS:   [X]  denotes positive finding, [ ]  denotes negative finding Cardiac  Comments:  Chest pain or chest pressure:    Shortness of breath upon exertion:    Short of breath when lying flat:    Irregular heart rhythm:        Vascular    Pain in calf, thigh, or hip brought on by ambulation:    Pain in feet at night that wakes you up from your sleep:     Blood clot in your veins:    Leg swelling:         Pulmonary    Oxygen at home:    Productive cough:     Wheezing:         Neurologic    Sudden weakness in arms or legs:     Sudden numbness in arms or legs:     Sudden onset of difficulty speaking or slurred speech:    Temporary loss of vision in  one eye:     Problems with dizziness:         Gastrointestinal    Blood in stool:     Vomited blood:         Genitourinary  Burning when urinating:     Blood in urine:        Psychiatric    Major depression:         Hematologic    Bleeding problems:    Problems with blood clotting too easily:        Skin    Rashes or ulcers:        Constitutional    Fever or chills:      PHYSICAL EXAMINATION:  Vitals:   02/25/21 1327 02/25/21 1335  BP: (!) 149/65 132/61  Pulse: 62   Resp: 20   Temp: 98.7 F (37.1 C)   TempSrc: Temporal   SpO2: 98%   Weight: 160 lb 3.2 oz (72.7 kg)   Height: 5' (1.524 m)     General:  WDWN in NAD; vital signs documented above Gait: Not observed HENT: WNL, normocephalic Pulmonary: normal non-labored breathing , without Rales, rhonchi,  wheezing Cardiac: regular HR Abdomen: soft, NT, no masses Skin: without rashes Extremities: without ischemic changes, without Gangrene , without cellulitis; without open wounds;  Musculoskeletal: no muscle wasting or atrophy  Neurologic: A&O X 3;  CN grossly intact Psychiatric:  The pt has Normal affect.   Non-Invasive Vascular Imaging:   B ICa 1-39% stenosis    ASSESSMENT/PLAN:: 80 y.o. female here for reevaluation of carotid arteries  Duplex unchanged over the past 14 months demonstrating bilateral ICA stenosis estimated to be 1 to 39% Etiology of neck discomfort unrelated to carotid arteries Follow-up for repeat carotid duplex in 5 years   Dagoberto Ligas, PA-C [facility] [phone]  Clinic MD:   Scot Dock

## 2021-02-27 ENCOUNTER — Telehealth: Payer: Self-pay | Admitting: *Deleted

## 2021-02-27 NOTE — Telephone Encounter (Signed)
-----   Message from Arnoldo Lenis, MD sent at 02/27/2021 11:48 AM EST ----- Normal labs   Zandra Abts MD

## 2021-02-27 NOTE — Telephone Encounter (Signed)
Pt aware.

## 2021-02-27 NOTE — Telephone Encounter (Signed)
Pt voiced understanding

## 2021-04-02 ENCOUNTER — Other Ambulatory Visit: Payer: Self-pay

## 2021-04-02 ENCOUNTER — Encounter (HOSPITAL_COMMUNITY): Payer: Self-pay | Admitting: Emergency Medicine

## 2021-04-02 ENCOUNTER — Observation Stay (HOSPITAL_COMMUNITY)
Admission: EM | Admit: 2021-04-02 | Discharge: 2021-04-03 | Disposition: A | Discharge status: Home / Self Care | Payer: Medicare Other | Attending: Emergency Medicine | Admitting: Emergency Medicine

## 2021-04-02 ENCOUNTER — Emergency Department (HOSPITAL_COMMUNITY): Payer: Medicare Other

## 2021-04-02 DIAGNOSIS — R1312 Dysphagia, oropharyngeal phase: Secondary | ICD-10-CM | POA: Diagnosis not present

## 2021-04-02 DIAGNOSIS — I10 Essential (primary) hypertension: Secondary | ICD-10-CM | POA: Insufficient documentation

## 2021-04-02 DIAGNOSIS — R2681 Unsteadiness on feet: Secondary | ICD-10-CM | POA: Diagnosis not present

## 2021-04-02 DIAGNOSIS — R5383 Other fatigue: Secondary | ICD-10-CM | POA: Insufficient documentation

## 2021-04-02 DIAGNOSIS — Z79899 Other long term (current) drug therapy: Secondary | ICD-10-CM | POA: Diagnosis not present

## 2021-04-02 DIAGNOSIS — Y900 Blood alcohol level of less than 20 mg/100 ml: Secondary | ICD-10-CM | POA: Insufficient documentation

## 2021-04-02 DIAGNOSIS — Z20822 Contact with and (suspected) exposure to covid-19: Secondary | ICD-10-CM | POA: Diagnosis not present

## 2021-04-02 DIAGNOSIS — R519 Headache, unspecified: Secondary | ICD-10-CM | POA: Insufficient documentation

## 2021-04-02 DIAGNOSIS — E119 Type 2 diabetes mellitus without complications: Secondary | ICD-10-CM | POA: Insufficient documentation

## 2021-04-02 DIAGNOSIS — E039 Hypothyroidism, unspecified: Secondary | ICD-10-CM | POA: Insufficient documentation

## 2021-04-02 DIAGNOSIS — I679 Cerebrovascular disease, unspecified: Secondary | ICD-10-CM

## 2021-04-02 DIAGNOSIS — Z7982 Long term (current) use of aspirin: Secondary | ICD-10-CM | POA: Diagnosis not present

## 2021-04-02 DIAGNOSIS — I2510 Atherosclerotic heart disease of native coronary artery without angina pectoris: Secondary | ICD-10-CM | POA: Diagnosis not present

## 2021-04-02 DIAGNOSIS — Z87891 Personal history of nicotine dependence: Secondary | ICD-10-CM | POA: Insufficient documentation

## 2021-04-02 DIAGNOSIS — E785 Hyperlipidemia, unspecified: Secondary | ICD-10-CM | POA: Diagnosis present

## 2021-04-02 DIAGNOSIS — K219 Gastro-esophageal reflux disease without esophagitis: Secondary | ICD-10-CM | POA: Diagnosis not present

## 2021-04-02 DIAGNOSIS — F419 Anxiety disorder, unspecified: Secondary | ICD-10-CM | POA: Diagnosis present

## 2021-04-02 DIAGNOSIS — R1310 Dysphagia, unspecified: Secondary | ICD-10-CM | POA: Diagnosis present

## 2021-04-02 DIAGNOSIS — G4733 Obstructive sleep apnea (adult) (pediatric): Secondary | ICD-10-CM | POA: Diagnosis present

## 2021-04-02 HISTORY — DX: Anxiety disorder, unspecified: F41.9

## 2021-04-02 LAB — COMPREHENSIVE METABOLIC PANEL
ALT: 23 U/L (ref 0–44)
AST: 25 U/L (ref 15–41)
Albumin: 4.6 g/dL (ref 3.5–5.0)
Alkaline Phosphatase: 65 U/L (ref 38–126)
Anion gap: 13 (ref 5–15)
BUN: 18 mg/dL (ref 8–23)
CO2: 26 mmol/L (ref 22–32)
Calcium: 9.8 mg/dL (ref 8.9–10.3)
Chloride: 102 mmol/L (ref 98–111)
Creatinine, Ser: 0.7 mg/dL (ref 0.44–1.00)
GFR, Estimated: >60 mL/min (ref >60)
Glucose, Bld: 140 mg/dL — ABNORMAL HIGH (ref 70–99)
Potassium: 3.6 mmol/L (ref 3.5–5.1)
Sodium: 141 mmol/L (ref 135–145)
Total Bilirubin: 0.5 mg/dL (ref 0.3–1.2)
Total Protein: 8.6 g/dL — ABNORMAL HIGH (ref 6.5–8.1)

## 2021-04-02 LAB — URINALYSIS, ROUTINE W REFLEX MICROSCOPIC
Bilirubin Urine: NEGATIVE
Glucose, UA: NEGATIVE mg/dL
Ketones, ur: NEGATIVE mg/dL
Nitrite: POSITIVE — AB
Specific Gravity, Urine: >1.03 — ABNORMAL HIGH (ref 1.005–1.030)
pH: 5.5 (ref 5.0–8.0)

## 2021-04-02 LAB — RAPID URINE DRUG SCREEN, HOSP PERFORMED
Amphetamines: NOT DETECTED
Barbiturates: NOT DETECTED
Benzodiazepines: NOT DETECTED
Cocaine: NOT DETECTED
Opiates: NOT DETECTED
Tetrahydrocannabinol: NOT DETECTED

## 2021-04-02 LAB — CBC
HCT: 40.5 % (ref 36.0–46.0)
Hemoglobin: 12.9 g/dL (ref 12.0–15.0)
MCH: 30.4 pg (ref 26.0–34.0)
MCHC: 31.9 g/dL (ref 30.0–36.0)
MCV: 95.5 fL (ref 80.0–100.0)
Platelets: 345 10*3/uL (ref 150–400)
RBC: 4.24 MIL/uL (ref 3.87–5.11)
RDW: 14.2 % (ref 11.5–15.5)
WBC: 11.3 10*3/uL — ABNORMAL HIGH (ref 4.0–10.5)
nRBC: 0 % (ref 0.0–0.2)

## 2021-04-02 LAB — APTT: aPTT: 28 seconds (ref 24–36)

## 2021-04-02 LAB — DIFFERENTIAL
Abs Immature Granulocytes: 0.02 10*3/uL (ref 0.00–0.07)
Basophils Absolute: 0.1 10*3/uL (ref 0.0–0.1)
Basophils Relative: 0 %
Eosinophils Absolute: 0.1 10*3/uL (ref 0.0–0.5)
Eosinophils Relative: 1 %
Immature Granulocytes: 0 %
Lymphocytes Relative: 24 %
Lymphs Abs: 2.7 10*3/uL (ref 0.7–4.0)
Monocytes Absolute: 0.9 10*3/uL (ref 0.1–1.0)
Monocytes Relative: 8 %
Neutro Abs: 7.5 10*3/uL (ref 1.7–7.7)
Neutrophils Relative %: 67 %

## 2021-04-02 LAB — ETHANOL: Alcohol, Ethyl (B): <10 mg/dL (ref <10)

## 2021-04-02 LAB — PROTIME-INR
INR: 1 (ref 0.8–1.2)
Prothrombin Time: 13.2 seconds (ref 11.4–15.2)

## 2021-04-02 LAB — URINALYSIS, MICROSCOPIC (REFLEX): RBC / HPF: NONE SEEN RBC/hpf (ref 0–5)

## 2021-04-02 NOTE — ED Provider Notes (Signed)
[facility] EMERGENCY DEPARTMENT Provider Note   CSN: [id number] Arrival date & time: 04/02/21  2057     History Chief Complaint  Patient presents with  . Dysphagia    Melinda Acevedo is a 80 y.o. female.  HPI   This patient is a 80 year old female, she has a known history of coronary disease, she has had some carotid occlusions, known history of diabetes hypertension and hyperlipidemia.  She is currently on medications including amlodipine, benazepril, Crestor, Aciphex and as needed Nitrostat.  She presents to the hospital today with approximately 1 week of symptoms.  This gradually started with a feeling of difficulty with swallowing and a feeling of dizziness.  The dizziness actually came on first, she feels like she is off balance and was having nausea with it.  That has been persistent, several days after the dizziness started she started to have the feeling of not being able to swallow correctly.  This has gradually progressed and today she feels like she cannot swallow at all.  When she tries to swallow it like the swallowing mechanism does not work.  Occasionally she will have a spontaneous swallow and has been able to do that a couple of times with food today but she cannot force to swallow.  She denies numbness or weakness of the arms of the legs, no changes in vision, no changes in speech, no facial droop.  No chest pain shortness of breath or palpitations.  No fevers chills vomiting or diarrhea.  She did take Zofran for the nausea which has helped.  Past Medical History:  Diagnosis Date  . CAD (coronary artery disease)   . Carotid artery occlusion   . Diabetes mellitus without complication ([place])   . Glaucoma   . History of hiatal hernia   . Hyperlipidemia   . Hypertension   . Irregular heart beats   . Seasonal allergies   . Sleep apnea     Patient Active Problem List   Diagnosis Date Noted  . Anxiety 01/06/2021  . Constipation, unspecified 01/06/2021  . Diverticulitis  01/06/2021  . Fatigue 01/06/2021  . GERD (gastroesophageal reflux disease) 01/06/2021  . Hyperlipidemia 01/06/2021  . Hypertension 01/06/2021  . Hypothyroidism 01/06/2021  . Impaired fasting glucose 01/06/2021  . Osteoporosis 01/06/2021  . Vitamin D deficiency 01/06/2021  . Neck pain 01/01/2021  . History of colonic polyps 01/01/2021  . Obstructive sleep apnea 08/26/2017    Past Surgical History:  Procedure Laterality Date  . ABDOMINAL HYSTERECTOMY    . CATARACT EXTRACTION Bilateral   . CHOLECYSTECTOMY    . COLONOSCOPY  2017   Dr.Williams in [place] , [place]  . COLONOSCOPY WITH PROPOFOL N/A 02/06/2021   Procedure: COLONOSCOPY WITH PROPOFOL;  Surgeon: Harvel Quale, MD;  [facility];  Service: Gastroenterology;  Laterality: N/A;  10:30  . ESOPHAGOGASTRODUODENOSCOPY (EGD) WITH PROPOFOL N/A 02/06/2021   Procedure: ESOPHAGOGASTRODUODENOSCOPY (EGD) WITH PROPOFOL;  Surgeon: Harvel Quale, MD;  [facility];  Service: Gastroenterology;  Laterality: N/A;  . heart catherization  [id number], 2010  . RECTOCELE REPAIR    . TONSILLECTOMY    . TUBAL LIGATION  1986  . UPPER GASTROINTESTINAL ENDOSCOPY  2015   Dr.Williams in [place],   . VAGINAL HYSTERECTOMY  1991     OB History   No obstetric history on file.     Family History  Problem Relation Age of Onset  . COPD Mother   . Heart disease Mother   . Heart disease Father   .  Heart attack Father   . Hypertension Sister   . Mitral valve prolapse Brother     Social History   Tobacco Use  . Smoking status: Former Smoker    Years: 2.00    Types: Cigarettes    Quit date: 1964    Years since quitting: 58.3  . Smokeless tobacco: Never Used  Vaping Use  . Vaping Use: Never used  Substance Use Topics  . Alcohol use: No  . Drug use: No    Home Medications Prior to Admission medications   Medication Sig Start Date End Date Taking? Authorizing Provider  amLODipine (NORVASC) 5 MG  tablet Take 5 mg by mouth daily.    [provider]  aspirin 81 MG tablet Take 81 mg by mouth daily.    [provider]  benazepril (LOTENSIN) 20 MG tablet Take 1 tablet (20 mg total) by mouth 2 (two) times daily. Patient taking differently: Take 40 mg by mouth 2 (two) times daily. 02/10/21   Arnoldo Lenis, MD  BLACK ELDERBERRY PO Take 1 each by mouth daily.    [provider]  Cholecalciferol (VITAMIN D) 125 MCG (5000 UT) CAPS Take 5,000 Units by mouth daily.     [provider]  Coenzyme Q10 (COQ10) 100 MG CAPS Take 200 mg by mouth daily.    [provider]  loratadine (CLARITIN) 10 MG tablet Take 10 mg by mouth daily.    [provider]  Menthol, Topical Analgesic, (BIOFREEZE EX) Apply topically. 03/03/18   [provider]  Multiple Vitamins-Minerals (MULTIVITAMIN ADULT PO) Take 1 tablet by mouth daily.    [provider]  nitrofurantoin, macrocrystal-monohydrate, (MACROBID) 100 MG capsule Take 100 mg by mouth 2 (two) times daily as needed (UTI). 12/17/19   [provider]  nitroGLYCERIN (NITROSTAT) 0.4 MG SL tablet Place 0.4 mg under the tongue every 5 (five) minutes as needed for chest pain.    [provider]  ondansetron (ZOFRAN) 4 MG tablet Take 4 mg by mouth every 8 (eight) hours as needed. 01/19/21   [provider]  RABEprazole (ACIPHEX) 20 MG tablet Take 20 mg by mouth daily.    [provider]  rosuvastatin (CRESTOR) 5 MG tablet Take 5 mg by mouth daily.    [provider]  Travoprost, BAK Free, (TRAVATAN) 0.004 % SOLN ophthalmic solution Place 1 drop into both eyes at bedtime.    [provider]    Allergies    Contrast media [iodinated diagnostic agents], Alendronate, Cinoxacin, Diltiazem, Keflex [cephalexin], Naproxen, Nifedipine, Norfloxacin, Nsaids, Other, Pravastatin sodium, Trimethoprim, and Valdecoxib  Review of Systems   Review of Systems  All  other systems reviewed and are negative.   Physical Exam Updated Vital Signs BP (!) 166/77 (BP Location: Left Arm)   Pulse 88   Temp 98.7 F (37.1 C) (Oral)   Resp 17   Ht 1.524 m (5')   Wt 71.7 kg   SpO2 95%   BMI 30.86 kg/m   Physical Exam Vitals and nursing note reviewed.  Constitutional:      General: She is not in acute distress.    Appearance: She is well-developed.  HENT:     Head: Normocephalic and atraumatic.     Mouth/Throat:     Pharynx: No oropharyngeal exudate.  Eyes:     General: No scleral icterus.       Right eye: No discharge.        Left eye: No discharge.  Conjunctiva/sclera: Conjunctivae normal.     Pupils: Pupils are equal, round, and reactive to light.  Neck:     Thyroid: No thyromegaly.     Vascular: No JVD.  Cardiovascular:     Rate and Rhythm: Normal rate and regular rhythm.     Heart sounds: Normal heart sounds. No murmur heard. No friction rub. No gallop.   Pulmonary:     Effort: Pulmonary effort is normal. No respiratory distress.     Breath sounds: Normal breath sounds. No wheezing or rales.  Abdominal:     General: Bowel sounds are normal. There is no distension.     Palpations: Abdomen is soft. There is no mass.     Tenderness: There is no abdominal tenderness.  Musculoskeletal:        General: No tenderness. Normal range of motion.     Cervical back: Normal range of motion and neck supple.  Lymphadenopathy:     Cervical: No cervical adenopathy.  Skin:    General: Skin is warm and dry.     Findings: No erythema or rash.  Neurological:     Mental Status: She is alert.     Coordination: Coordination normal.     Comments: The patient is not able to force a swallow, when she opens her mouth the palate does not rise, she is able to shrug her shoulders and all the other cranial nerves III through XII appear normal.  Visual acuity is grossly normal, peripheral visual fields are normal, finger-nose-finger is normal, heel shin is  normal, no pronator drift, normal strength in all 4 extremities, normal sensation diffusely.  Speech is clear  Psychiatric:        Behavior: Behavior normal.     ED Results / Procedures / Treatments   Labs (all labs ordered are listed, but only abnormal results are displayed) Labs Reviewed - No data to display  EKG None  Radiology No results found.  Procedures Procedures   Medications Ordered in ED Medications - No data to display  ED Course  I have reviewed the triage vital signs and the nursing notes.  Pertinent labs & imaging results that were available during my care of the patient were reviewed by me and considered in my medical decision making (see chart for details).    MDM Rules/Calculators/A&P                          This patient's exam is concerning and that she does have some elements of posterior circulation abnormalities as well as a cranial nerve abnormality.  She is not anticoagulated, will need to have neurology consultation, symptoms have been progressive over the week.  She has not a code stroke tPA candidate.  This may be more of a functional swallowing issue however she did have an upper endoscopy by Dr. Jenetta Downer for neck pain within the last couple of months which was unremarkable.  It showed normal nasopharynx esophagus stomach duodenum.  She had no difficulty with swallowing at that time  I have requested formal neurology evaluation, CT scan and labs have been ordered.  I requested consultation with neurology and admission to the hospital for further work-up of possible stroke  Final Clinical Impression(s) / ED Diagnoses Final diagnoses:  None    Rx / DC Orders ED Discharge Orders    None       Noemi Chapel, MD 04/10/21 1700

## 2021-04-02 NOTE — ED Notes (Signed)
Tele neuro paged for STAT consult to Dr Hazle Nordmann @ [phone]

## 2021-04-02 NOTE — ED Notes (Signed)
Patient transported to CT 

## 2021-04-02 NOTE — ED Triage Notes (Signed)
Pt reports difficulty swallowing since Sunday; reports gag reflex when she attempts to swallow

## 2021-04-03 ENCOUNTER — Observation Stay (HOSPITAL_COMMUNITY): Payer: Medicare Other

## 2021-04-03 ENCOUNTER — Encounter (HOSPITAL_COMMUNITY): Payer: Self-pay | Admitting: Family Medicine

## 2021-04-03 ENCOUNTER — Observation Stay (HOSPITAL_BASED_OUTPATIENT_CLINIC_OR_DEPARTMENT_OTHER): Payer: Medicare Other

## 2021-04-03 DIAGNOSIS — G4733 Obstructive sleep apnea (adult) (pediatric): Secondary | ICD-10-CM

## 2021-04-03 DIAGNOSIS — R1310 Dysphagia, unspecified: Secondary | ICD-10-CM | POA: Diagnosis not present

## 2021-04-03 DIAGNOSIS — E039 Hypothyroidism, unspecified: Secondary | ICD-10-CM

## 2021-04-03 DIAGNOSIS — R1312 Dysphagia, oropharyngeal phase: Secondary | ICD-10-CM | POA: Diagnosis not present

## 2021-04-03 DIAGNOSIS — K219 Gastro-esophageal reflux disease without esophagitis: Secondary | ICD-10-CM

## 2021-04-03 DIAGNOSIS — I6389 Other cerebral infarction: Secondary | ICD-10-CM | POA: Diagnosis not present

## 2021-04-03 LAB — CBC
HCT: 41.3 % (ref 36.0–46.0)
Hemoglobin: 13 g/dL (ref 12.0–15.0)
MCH: 30.5 pg (ref 26.0–34.0)
MCHC: 31.5 g/dL (ref 30.0–36.0)
MCV: 96.9 fL (ref 80.0–100.0)
Platelets: 337 10*3/uL (ref 150–400)
RBC: 4.26 MIL/uL (ref 3.87–5.11)
RDW: 14.4 % (ref 11.5–15.5)
WBC: 9.9 10*3/uL (ref 4.0–10.5)
nRBC: 0 % (ref 0.0–0.2)

## 2021-04-03 LAB — LIPID PANEL
Cholesterol: 130 mg/dL (ref 0–200)
HDL: 62 mg/dL (ref >40)
LDL Cholesterol: 55 mg/dL (ref 0–99)
Total CHOL/HDL Ratio: 2.1 RATIO
Triglycerides: 65 mg/dL (ref <150)
VLDL: 13 mg/dL (ref 0–40)

## 2021-04-03 LAB — ECHOCARDIOGRAM COMPLETE
Area-P 1/2: 2.24 cm2
Height: 60 in
S' Lateral: 2.88 cm
Weight: 2528 oz

## 2021-04-03 LAB — GLUCOSE, CAPILLARY
Glucose-Capillary: 121 mg/dL — ABNORMAL HIGH (ref 70–99)
Glucose-Capillary: 128 mg/dL — ABNORMAL HIGH (ref 70–99)
Glucose-Capillary: 100 mg/dL — ABNORMAL HIGH (ref 70–99)

## 2021-04-03 LAB — HEMOGLOBIN A1C
Hgb A1c MFr Bld: 6.5 % — ABNORMAL HIGH (ref 4.8–5.6)
Mean Plasma Glucose: 139.85 mg/dL

## 2021-04-03 LAB — RESP PANEL BY RT-PCR (FLU A&B, COVID) ARPGX2
Influenza A by PCR: NEGATIVE
Influenza B by PCR: NEGATIVE
SARS Coronavirus 2 by RT PCR: NEGATIVE

## 2021-04-03 LAB — COMPREHENSIVE METABOLIC PANEL
ALT: 20 U/L (ref 0–44)
AST: 25 U/L (ref 15–41)
Albumin: 4.3 g/dL (ref 3.5–5.0)
Alkaline Phosphatase: 61 U/L (ref 38–126)
Anion gap: 13 (ref 5–15)
BUN: 17 mg/dL (ref 8–23)
CO2: 27 mmol/L (ref 22–32)
Calcium: 9.6 mg/dL (ref 8.9–10.3)
Chloride: 102 mmol/L (ref 98–111)
Creatinine, Ser: 0.64 mg/dL (ref 0.44–1.00)
GFR, Estimated: >60 mL/min (ref >60)
Glucose, Bld: 121 mg/dL — ABNORMAL HIGH (ref 70–99)
Potassium: 3.4 mmol/L — ABNORMAL LOW (ref 3.5–5.1)
Sodium: 142 mmol/L (ref 135–145)
Total Bilirubin: 0.6 mg/dL (ref 0.3–1.2)
Total Protein: 8.1 g/dL (ref 6.5–8.1)

## 2021-04-03 MED ORDER — ACETAMINOPHEN 650 MG RE SUPP: 650.0000 mg | RECTAL | Status: DC | PRN

## 2021-04-03 MED ORDER — ACETAMINOPHEN 160 MG/5ML PO SOLN: 650.0000 mg | ORAL | Status: DC | PRN

## 2021-04-03 MED ORDER — BENAZEPRIL HCL 20 MG PO TABS: 10.0000 mg | ORAL_TABLET | Freq: Two times a day (BID) | ORAL | Status: DC

## 2021-04-03 MED ORDER — ASPIRIN 81 MG PO CHEW
81.0000 mg | CHEWABLE_TABLET | Freq: Every day | ORAL | Status: DC
  Administered 2021-04-03: 81 mg via ORAL
  Filled 2021-04-03: qty 1

## 2021-04-03 MED ORDER — ONDANSETRON HCL 4 MG/2ML IJ SOLN: 4.0000 mg | Freq: Three times a day (TID) | INTRAMUSCULAR | Status: DC | PRN

## 2021-04-03 MED ORDER — INSULIN ASPART 100 UNIT/ML ~~LOC~~ SOLN: 0.0000 [IU] | Freq: Every day | SUBCUTANEOUS | Status: DC

## 2021-04-03 MED ORDER — SODIUM CHLORIDE 0.9 % IV SOLN: INTRAVENOUS | Status: DC

## 2021-04-03 MED ORDER — CIPROFLOXACIN IN D5W 400 MG/200ML IV SOLN
400.0000 mg | Freq: Two times a day (BID) | INTRAVENOUS | Status: DC
  Administered 2021-04-03 (×2): 400 mg via INTRAVENOUS
  Filled 2021-04-03 (×2): qty 200

## 2021-04-03 MED ORDER — SENNOSIDES-DOCUSATE SODIUM 8.6-50 MG PO TABS: 1.0000 | ORAL_TABLET | Freq: Every evening | ORAL | Status: DC | PRN

## 2021-04-03 MED ORDER — HYDRALAZINE HCL 20 MG/ML IJ SOLN: 10.0000 mg | Freq: Three times a day (TID) | INTRAMUSCULAR | Status: DC | PRN

## 2021-04-03 MED ORDER — ACETAMINOPHEN 325 MG PO TABS
650.0000 mg | ORAL_TABLET | ORAL | Status: DC | PRN
Start: 2021-04-03 — End: 2021-04-04

## 2021-04-03 MED ORDER — GADOBUTROL 1 MMOL/ML IV SOLN
7.5000 mL | Freq: Once | INTRAVENOUS | Status: AC | PRN
  Administered 2021-04-03: 7.5 mL via INTRAVENOUS

## 2021-04-03 MED ORDER — LORAZEPAM 2 MG/ML IJ SOLN
1.0000 mg | Freq: Four times a day (QID) | INTRAMUSCULAR | Status: DC | PRN
  Administered 2021-04-03: 1 mg via INTRAVENOUS
  Filled 2021-04-03: qty 1

## 2021-04-03 MED ORDER — INSULIN ASPART 100 UNIT/ML ~~LOC~~ SOLN: 0.0000 [IU] | Freq: Three times a day (TID) | SUBCUTANEOUS | Status: DC

## 2021-04-03 MED ORDER — HEPARIN SODIUM (PORCINE) 5000 UNIT/ML IJ SOLN
5000.0000 [IU] | Freq: Three times a day (TID) | INTRAMUSCULAR | Status: DC
  Administered 2021-04-03 (×2): 5000 [IU] via SUBCUTANEOUS
  Filled 2021-04-03 (×2): qty 1

## 2021-04-03 MED ORDER — POTASSIUM CHLORIDE CRYS ER 20 MEQ PO TBCR
30.0000 meq | EXTENDED_RELEASE_TABLET | Freq: Once | ORAL | Status: AC
  Administered 2021-04-03: 30 meq via ORAL
  Filled 2021-04-03: qty 1

## 2021-04-03 MED ORDER — CIPROFLOXACIN HCL 250 MG PO TABS: 250.0000 mg | ORAL_TABLET | Freq: Two times a day (BID) | ORAL | 0 refills | Status: AC

## 2021-04-03 MED ORDER — STROKE: EARLY STAGES OF RECOVERY BOOK: Freq: Once | Status: AC

## 2021-04-03 NOTE — ED Provider Notes (Signed)
Received a phone call from teleneurology.  Patient with difficulty swallowing and imbalance, worrisome for posterior event.  CT scan does not show any acute pathology.  Recommends admitting to hospital and obtaining MRI to rule out stroke.  Okay to admit here and have MRI performed in the morning, no urgency.  Recommends speech swallow evaluation.  Okay to continue aspirin that she currently takes, will need to add Plavix if there is a stroke seen on the MRI.   Orpah Greek, MD 04/03/21 [phone]

## 2021-04-03 NOTE — Evaluation (Signed)
Occupational Therapy Evaluation Patient Details Name: Melinda Acevedo MRN: [id number] DOB: Feb 23, 1941 Today's Date: 04/03/2021    History of Present Illness Melinda Acevedo  is a 80 y.o. female, with history of sleep apnea, hypertension, hyperlipidemia, diabetes mellitus type 2, coronary artery disease, and more presents to ED with a chief complaint of dysphagia.  Patient reports it started about 3 days ago.  At first she has had the sensation that she had to swallow constantly.  Today became acutely worse where she felt like she could not initiate a swallow.  She has more difficulty with liquids than solids.  She reports that she crush some of her medications and mix them with ice cream to take them this morning.  She reports some associated nausea for which she took Zofran at home and felt better.  Patient has also had dizziness for about 1 week and is unsure if it is related.  The dizziness is intermittent, and does not necessarily happen more upon standing then when at rest.  She thought the dizziness was due to her cardiologist recently increasing her blood pressure medications, but that happened the first week of March and she has not otherwise had any symptoms from it.  Patient does report that she had a severe headache 2 weeks ago.  It lasted for split-second and was gone.  She reports it was located in her left temple and was not associated with any change in vision or change in hearing.  Patient denies any pain in her neck with this dysphagia.  She does report that in September 2021 she had pain on both sides of her neck with any exertion.  She reports having a full work-up including ultrasound of her carotids that was negative for any acute problem.  Patient reports that at that time she also was referred to GI and endoscopy and noted no acute problems either.  Of note patient reports that in the 80s she had a severe choking episode on Coca-[employer], and she has had anxiety about swallowing since then.  She  reports that she tries to concentrate to make sure that she does not aspirate.  She denies any stressors that could be increasing her anxiety regarding this at this time.  On review of systems patient denies urinary frequency.  She reports that she gets chronic UTIs and has a "self start" antibiotic at home to take when she feels UTI symptoms developing.  She has not started his antibiotic.  She thinks that it Cipro, but on her med rec it appears that it might be Macrobid.  Patient does report that she is taking Cipro at home.  We discussed Rocephin and patient reports that she had an allergic reaction to Keflex and does not want to take Rocephin.   Clinical Impression   Pt agreeable to OT/PT co-evaluation. Pt able to ambulate in hall with independence. Independent with bed mobility and and functional transfers. 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 Pt not recommended for further OT in the hospital setting or other venues due to being at or near baseline for strength, endurance, and ADL's.     Follow Up Recommendations  No OT follow up    Equipment Recommendations  None recommended by OT           Precautions / Restrictions Precautions Precautions: Fall Restrictions Weight Bearing Restrictions: No      Mobility Bed Mobility Overal bed mobility: Independent  Transfers Overall transfer level: Independent Equipment used: None                  Balance Overall balance assessment: Needs assistance Sitting-balance support: No upper extremity supported;Feet supported Sitting balance-Leahy Scale: Normal Sitting balance - Comments: seated EOB   Standing balance support: No upper extremity supported;During functional activity Standing balance-Leahy Scale: Good Standing balance comment: good without AD                           ADL either performed or assessed with clinical  judgement   ADL Overall ADL's : Independent                                             Vision Baseline Vision/History: No visual deficits                  Pertinent Vitals/Pain Pain Assessment: No/denies pain     Hand Dominance Right   Extremity/Trunk Assessment Upper Extremity Assessment Upper Extremity Assessment: Overall WFL for tasks assessed;Generalized weakness (4/5 shoulder mmt grossly. B shoulder arthritis at baseline)   Lower Extremity Assessment Lower Extremity Assessment: Defer to PT evaluation   Cervical / Trunk Assessment Cervical / Trunk Assessment: Normal   Communication Communication Communication: No difficulties   Cognition Arousal/Alertness: Awake/alert Behavior During Therapy: WFL for tasks assessed/performed Overall Cognitive Status: Within Functional Limits for tasks assessed                                                      Home Living Family/patient expects to be discharged to:: Private residence Living Arrangements: Spouse/significant other Available Help at Discharge: Family Type of Home: House Home Access: Stairs to enter Technical brewer of Steps: 3 Entrance Stairs-Rails: None Home Layout: Two level;Able to live on main level with bedroom/bathroom     Bathroom Shower/Tub: Teacher, early years/pre: Standard Bathroom Accessibility: Yes How Accessible: Accessible via walker Home Equipment: Broadway - 2 wheels;Cane - single point;Grab bars - tub/shower          Prior Functioning/Environment Level of Independence: Independent        Comments: patient states independent with ADL, short distance community ambulator without AD                      OT Goals(Current goals can be found in the care plan section) Acute Rehab OT Goals Patient Stated Goal: Return home  OT Frequency:                 Co-evaluation PT/OT/SLP Co-Evaluation/Treatment: Yes Reason for  Co-Treatment: Complexity of the patient's impairments (multi-system involvement) PT goals addressed during session: Mobility/safety with mobility;Balance;Strengthening/ROM OT goals addressed during session: ADL's and self-care;Strengthening/ROM      AM-PAC OT "6 Clicks" Daily Activity     Outcome Measure Help from another person eating meals?: None Help from another person taking care of personal grooming?: None Help from another person toileting, which includes using toliet, bedpan, or urinal?: None Help from another person bathing (including washing, rinsing, drying)?: None Help from another person to put on and taking off regular upper body clothing?: None Help from another  person to put on and taking off regular lower body clothing?: None 6 Click Score: 24   End of Session    Activity Tolerance: Patient tolerated treatment well Patient left: in chair;with call bell/phone within reach  OT Visit Diagnosis: Unsteadiness on feet (R26.81);Muscle weakness (generalized) (M62.81)                Time: 2500-3704 OT Time Calculation (min): 12 min Charges:  OT General Charges $OT Visit: 1 Visit OT Evaluation $OT Eval Low Complexity: 1 Low  Connelly Spruell OT, MOT   Larey Seat 04/03/2021, 9:26 AM

## 2021-04-03 NOTE — Progress Notes (Signed)
SLP Cancellation Note  Patient Details Name: Melinda Acevedo MRN: [id number] DOB: [birth year]   Progress Note:        Plan to proceed with MBSS this afternoon; Pt is alert and was observed with lunch seemingly consuming more than she did during evaluation this am. Scheduled with radiology for 1:45pm this afternoon. Thank you,  Quinlin Conant H. Roddie Mc, CCC-SLP Speech Language Pathologist    Wende Bushy 04/03/2021, 12:52 PM

## 2021-04-03 NOTE — Discharge Summary (Addendum)
Physician Discharge Summary  Melinda Acevedo CZY:[id number] DOB: 1941/04/01 DOA: 04/02/2021  PCP: Melinda Gilles, DO  Admit date: 04/02/2021 Discharge date: 04/03/2021  Admitted From:  Home  Disposition: Home with  [facility]  Recommendations for Outpatient Follow-up:  1. Follow up with PCP in 1 week 2. Ambulatory referral to neurology for follow up 3. Follow up with ENT.  4. Please follow up results of 2D echocardiogram (results pending at time of discharge)  Home Health: SLP  Discharge Condition: STABLE   CODE STATUS: FULL  DIET:  Swallow Evaluation Recommendations     SLP Diet Recommendations: Dysphagia 3 (Mech soft) solids;Thin liquid   Liquid Administration via: Cup   Medication Administration: Crushed with puree   Supervision: Patient able to self feed   Compensations: Slow rate;Small sips/bites   Postural Changes: Remain semi-upright after after feeds/meals (Comment)   Oral Care Recommendations: Oral care BID    Brief Hospitalization Summary: Please see all hospital notes, images, labs for full details of the hospitalization. ADMISSION HPI:   80 y.o. female, with history of sleep apnea, hypertension, hyperlipidemia, diabetes mellitus type 2, coronary artery disease, and more presents to ED with a chief complaint of dysphagia.  Patient reports it started about 3 days ago.  At first she has had the sensation that she had to swallow constantly.  Today became acutely worse where she felt like she could not initiate a swallow.  She has more difficulty with liquids than solids.  She reports that she crush some of her medications and mix them with ice cream to take them this morning.  She reports some associated nausea for which she took Zofran at home and felt better.  Patient has also had dizziness for about 1 week and is unsure if it is related.  The dizziness is intermittent, and does not necessarily happen more upon standing then when at rest.  She thought the dizziness was due  to her cardiologist recently increasing her blood pressure medications, but that happened the first week of March and she has not otherwise had any symptoms from it.  Patient does report that she had a severe headache 2 weeks ago.  It lasted for split-second and was gone.  She reports it was located in her left temple and was not associated with any change in vision or change in hearing.  Patient denies any pain in her neck with this dysphagia.  She does report that in September 2021 she had pain on both sides of her neck with any exertion.  She reports having a full work-up including ultrasound of her carotids that was negative for any acute problem.  Patient reports that at that time she also was referred to GI and endoscopy and noted no acute problems either.  Of note patient reports that in the 80s she had a severe choking episode on Coca-Cola, and she has had anxiety about swallowing since then.  She reports that she tries to concentrate to make sure that she does not aspirate.  She denies any stressors that could be increasing her anxiety regarding this at this time.  On review of systems patient denies urinary frequency.  She reports that she gets chronic UTIs and has a "self start" antibiotic at home to take when she feels UTI symptoms developing.  She has not started his antibiotic.  She thinks that it Cipro, but on her med rec it appears that it might be Macrobid.  Patient does report that she is taking Cipro at home.  We discussed Rocephin and patient reports that she had an allergic reaction to Keflex and does not want to take Rocephin.  Patient does not smoke, does not drink, does not use illicit drugs.  Patient is full code.  Patient is vaccinated for COVID.  In the ED Temp 97.7, heart rate 75-88, respiratory 17-22, blood pressure 150/64, satting at 95% White blood cell count 11.3, hemoglobin 12.9, platelets 345 Chemistry is unremarkable UA is borderline CT head shows no acute intracranial  process UDS is negative, alcohol level is less than 10 EKG heart rate 76, sinus rhythm, QTC 388 Telemetry neuro consulted and recommends stroke work-up in a.m., continue daily aspirin, consider dual antiplatelet therapy it is positive for ischemic stroke finding Admission requested for stroke work-up  HOSPITAL COURSE Patient was admitted for further work-up of ongoing problems of dysphagia that she has been experiencing.  She has had multiple outpatient evaluations and work-up including ENT evaluation.  She reports the symptoms had worsened and she was admitted for stroke evaluation MRI and MRA and further SLP PT and OT evaluations.  A 2D echocardiogram was also ordered as part of the work-up.  The results of the echocardiogram are still pending at this time.  Patient was advised to follow-up those results with PCP on follow-up appointment.  MRI/MRA Noted below no acute findings.  Patient had an SLP evaluation which included a barium swallow study and recommendations are noted with mild dysphagia 3 diet recommended, thin liquids, meds crushed with pure recommended see recommendations above.  She was also noted to have a UTI treated with ciprofloxacin.  Her work-up in the hospital mostly unremarkable.  Further work-up outpatient recommended.  Home health SLP ordered.  Ambulatory referral to neurology placed.  Close follow-up outpatient recommended.  Patient advised to return if symptoms worsen or new problems develop.  Patient verbalized understanding. Discharge Diagnoses:  Active Problems:   Anxiety   Fatigue   GERD (gastroesophageal reflux disease)   Hyperlipidemia   Hypertension   Hypothyroidism   Obstructive sleep apnea   Dysphagia   Discharge Instructions: Discharge Instructions    Ambulatory referral to Neurology   Complete by: As directed    An appointment is requested in approximately: 2 weeks     Allergies as of 04/03/2021      Reactions   Contrast Media [iodinated Diagnostic  Agents] Shortness Of Breath   Alendronate    Cinoxacin Hives   Diltiazem    Keflex [cephalexin] Hives   Naproxen Other (See Comments)   Stomach upset   Nifedipine    Norfloxacin    Nsaids    Other    Pravastatin Sodium    Trimethoprim    Valdecoxib       Medication List    TAKE these medications   amLODipine 5 MG tablet Commonly known as: NORVASC Take 5 mg by mouth daily.   aspirin 81 MG tablet Take 81 mg by mouth daily.   benazepril 20 MG tablet Commonly known as: LOTENSIN Take 0.5 tablets (10 mg total) by mouth 2 (two) times daily.   BLACK ELDERBERRY PO Take 1 each by mouth daily.   ciprofloxacin 250 MG tablet Commonly known as: CIPRO Take 1 tablet (250 mg total) by mouth 2 (two) times daily for 2 days.   CoQ10 100 MG Caps Take 200 mg by mouth daily.   loratadine 10 MG tablet Commonly known as: CLARITIN Take 10 mg by mouth daily.   MULTIVITAMIN ADULT PO Take 1 tablet by mouth  daily.   nitrofurantoin (macrocrystal-monohydrate) 100 MG capsule Commonly known as: MACROBID Take 100 mg by mouth 2 (two) times daily as needed (UTI).   nitroGLYCERIN 0.4 MG SL tablet Commonly known as: NITROSTAT Place 0.4 mg under the tongue every 5 (five) minutes as needed for chest pain.   ondansetron 4 MG tablet Commonly known as: ZOFRAN Take 4 mg by mouth every 8 (eight) hours as needed for vomiting or nausea.   RABEprazole 20 MG tablet Commonly known as: ACIPHEX Take 20 mg by mouth daily.   rosuvastatin 5 MG tablet Commonly known as: CRESTOR Take 5 mg by mouth daily.   Travoprost (BAK Free) 0.004 % Soln ophthalmic solution Commonly known as: TRAVATAN Place 1 drop into both eyes at bedtime.   Vitamin D 125 MCG (5000 UT) Caps Take 5,000 Units by mouth daily.       Follow-up Information    Melinda Gilles, DO. Schedule an appointment as soon as possible for a visit in 1 week(s).   Specialty: Family Medicine Contact information: [place]  [phone]        Arnoldo Lenis, MD .   Specialty: Cardiology Contact information: [place] [address] [phone]              Allergies  Allergen Reactions  . Contrast Media [Iodinated Diagnostic Agents] Shortness Of Breath  . Alendronate   . Cinoxacin Hives  . Diltiazem   . Keflex [Cephalexin] Hives  . Naproxen Other (See Comments)    Stomach upset  . Nifedipine   . Norfloxacin   . Nsaids   . Other   . Pravastatin Sodium   . Trimethoprim   . Valdecoxib    Allergies as of 04/03/2021      Reactions   Contrast Media [iodinated Diagnostic Agents] Shortness Of Breath   Alendronate    Cinoxacin Hives   Diltiazem    Keflex [cephalexin] Hives   Naproxen Other (See Comments)   Stomach upset   Nifedipine    Norfloxacin    Nsaids    Other    Pravastatin Sodium    Trimethoprim    Valdecoxib       Medication List    TAKE these medications   amLODipine 5 MG tablet Commonly known as: NORVASC Take 5 mg by mouth daily.   aspirin 81 MG tablet Take 81 mg by mouth daily.   benazepril 20 MG tablet Commonly known as: LOTENSIN Take 0.5 tablets (10 mg total) by mouth 2 (two) times daily.   BLACK ELDERBERRY PO Take 1 each by mouth daily.   ciprofloxacin 250 MG tablet Commonly known as: CIPRO Take 1 tablet (250 mg total) by mouth 2 (two) times daily for 2 days.   CoQ10 100 MG Caps Take 200 mg by mouth daily.   loratadine 10 MG tablet Commonly known as: CLARITIN Take 10 mg by mouth daily.   MULTIVITAMIN ADULT PO Take 1 tablet by mouth daily.   nitrofurantoin (macrocrystal-monohydrate) 100 MG capsule Commonly known as: MACROBID Take 100 mg by mouth 2 (two) times daily as needed (UTI).   nitroGLYCERIN 0.4 MG SL tablet Commonly known as: NITROSTAT Place 0.4 mg under the tongue every 5 (five) minutes as needed for chest pain.   ondansetron 4 MG tablet Commonly known as: ZOFRAN Take 4 mg by mouth every 8 (eight)  hours as needed for vomiting or nausea.   RABEprazole 20 MG tablet Commonly known as: ACIPHEX Take  20 mg by mouth daily.   rosuvastatin 5 MG tablet Commonly known as: CRESTOR Take 5 mg by mouth daily.   Travoprost (BAK Free) 0.004 % Soln ophthalmic solution Commonly known as: TRAVATAN Place 1 drop into both eyes at bedtime.   Vitamin D 125 MCG (5000 UT) Caps Take 5,000 Units by mouth daily.       Procedures/Studies: CT HEAD WO CONTRAST  Result Date: 04/02/2021 CLINICAL DATA:  Difficulty swallowing for 4 days EXAM: CT HEAD WITHOUT CONTRAST TECHNIQUE: Contiguous axial images were obtained from the base of the skull through the vertex without intravenous contrast. COMPARISON:  None. FINDINGS: Brain: No acute infarct or hemorrhage. Hypodensities within the bilateral parietal periventricular white matter most consistent with chronic small vessel ischemic change. Lateral ventricles and remaining midline structures are unremarkable. No acute extra-axial fluid collections. No mass effect. Vascular: No hyperdense vessel or unexpected calcification. Skull: Normal. Negative for fracture or focal lesion. Sinuses/Orbits: No acute finding. Other: None. IMPRESSION: 1. No acute intracranial process. Electronically Signed   By: Randa Ngo M.D.   On: 04/02/2021 22:54   MR ANGIO HEAD WO CONTRAST  Result Date: 04/03/2021 CLINICAL DATA:  Acute neuro deficit. Altered mental status, weakness EXAM: MRI HEAD WITH CONTRAST MRA HEAD WITH CONTRAST MRI NECK WITHOUT AND WITH CONTRAST TECHNIQUE: Multiplanar, multiecho pulse sequences of the brain and surrounding structures were obtained with intravenous contrast. Angiographic images of the head were obtained using MRA technique with contrast. Multiplanar, multiecho pulse sequences of the neck and surrounding structures were obtained without and with intravenous contrast. CONTRAST:  7.67mL GADAVIST GADOBUTROL 1 MMOL/ML IV SOLN COMPARISON:  CT head 04/02/2021 FINDINGS:  MRI HEAD FINDINGS Brain: Subcentimeter diffusion hyperintensity in the right corona radiata. This shows high signal on ADC map and increased signal on FLAIR. Possible subacute to chronic infarct. No other diffusion abnormality. Scattered white matter hyperintensities bilaterally, moderate in degree. Chronic infarct left thalamus. Negative for hemorrhage or mass. Ventricle size and cerebral volume normal for age. Vascular: Normal arterial flow voids. Skull and upper cervical spine: No focal skeletal lesion. Cervical spondylosis. Sinuses/Orbits: Negative Other: None MRA HEAD FINDINGS Both vertebral arteries patent to the basilar. Left PICA is a large vessel with a severe proximal stenosis. Right PICA not visualized. Right AICA is present. Basilar widely patent. Superior cerebellar and posterior cerebral arteries patent bilaterally without stenosis. Internal carotid artery widely patent bilaterally. Anterior and middle cerebral arteries patent bilaterally. Moderate stenosis right MCA bifurcation. No significant left MCA stenosis. Anterior cerebral arteries patent bilaterally without stenosis. MRA NECK FINDINGS Antegrade flow in the carotid and vertebral arteries bilaterally Normal aortic arch and proximal great vessels. Decreased signal in the proximal vertebral artery bilaterally due to tortuosity. No significant stenosis. Carotid bifurcation appears widely patent bilaterally. No carotid stenosis. IMPRESSION: Negative for acute infarct. Small white matter lesion in the right corona radiata may represent a subacute to chronic infarct with T2 shine through. Mild to moderate chronic microvascular ischemic change. MRA head demonstrates no intracranial large vessel occlusion. There is a severe stenosis in the proximal left PICA which is a large vessel. Right PICA not visualized but may be supplied by the right AICA. Moderate stenosis right MCA bifurcation. Electronically Signed   By: Franchot Gallo M.D.   On: 04/03/2021  13:13   MR ANGIO NECK W WO CONTRAST  Result Date: 04/03/2021 CLINICAL DATA:  Acute neuro deficit. Altered mental status, weakness EXAM: MRI HEAD WITH CONTRAST MRA HEAD WITH CONTRAST MRI NECK WITHOUT AND WITH CONTRAST  TECHNIQUE: Multiplanar, multiecho pulse sequences of the brain and surrounding structures were obtained with intravenous contrast. Angiographic images of the head were obtained using MRA technique with contrast. Multiplanar, multiecho pulse sequences of the neck and surrounding structures were obtained without and with intravenous contrast. CONTRAST:  7.61mL GADAVIST GADOBUTROL 1 MMOL/ML IV SOLN COMPARISON:  CT head 04/02/2021 FINDINGS: MRI HEAD FINDINGS Brain: Subcentimeter diffusion hyperintensity in the right corona radiata. This shows high signal on ADC map and increased signal on FLAIR. Possible subacute to chronic infarct. No other diffusion abnormality. Scattered white matter hyperintensities bilaterally, moderate in degree. Chronic infarct left thalamus. Negative for hemorrhage or mass. Ventricle size and cerebral volume normal for age. Vascular: Normal arterial flow voids. Skull and upper cervical spine: No focal skeletal lesion. Cervical spondylosis. Sinuses/Orbits: Negative Other: None MRA HEAD FINDINGS Both vertebral arteries patent to the basilar. Left PICA is a large vessel with a severe proximal stenosis. Right PICA not visualized. Right AICA is present. Basilar widely patent. Superior cerebellar and posterior cerebral arteries patent bilaterally without stenosis. Internal carotid artery widely patent bilaterally. Anterior and middle cerebral arteries patent bilaterally. Moderate stenosis right MCA bifurcation. No significant left MCA stenosis. Anterior cerebral arteries patent bilaterally without stenosis. MRA NECK FINDINGS Antegrade flow in the carotid and vertebral arteries bilaterally Normal aortic arch and proximal great vessels. Decreased signal in the proximal vertebral artery  bilaterally due to tortuosity. No significant stenosis. Carotid bifurcation appears widely patent bilaterally. No carotid stenosis. IMPRESSION: Negative for acute infarct. Small white matter lesion in the right corona radiata may represent a subacute to chronic infarct with T2 shine through. Mild to moderate chronic microvascular ischemic change. MRA head demonstrates no intracranial large vessel occlusion. There is a severe stenosis in the proximal left PICA which is a large vessel. Right PICA not visualized but may be supplied by the right AICA. Moderate stenosis right MCA bifurcation. Electronically Signed   By: Franchot Gallo M.D.   On: 04/03/2021 13:13   MR BRAIN WO CONTRAST  Result Date: 04/03/2021 CLINICAL DATA:  Acute neuro deficit. Altered mental status, weakness EXAM: MRI HEAD WITH CONTRAST MRA HEAD WITH CONTRAST MRI NECK WITHOUT AND WITH CONTRAST TECHNIQUE: Multiplanar, multiecho pulse sequences of the brain and surrounding structures were obtained with intravenous contrast. Angiographic images of the head were obtained using MRA technique with contrast. Multiplanar, multiecho pulse sequences of the neck and surrounding structures were obtained without and with intravenous contrast. CONTRAST:  7.59mL GADAVIST GADOBUTROL 1 MMOL/ML IV SOLN COMPARISON:  CT head 04/02/2021 FINDINGS: MRI HEAD FINDINGS Brain: Subcentimeter diffusion hyperintensity in the right corona radiata. This shows high signal on ADC map and increased signal on FLAIR. Possible subacute to chronic infarct. No other diffusion abnormality. Scattered white matter hyperintensities bilaterally, moderate in degree. Chronic infarct left thalamus. Negative for hemorrhage or mass. Ventricle size and cerebral volume normal for age. Vascular: Normal arterial flow voids. Skull and upper cervical spine: No focal skeletal lesion. Cervical spondylosis. Sinuses/Orbits: Negative Other: None MRA HEAD FINDINGS Both vertebral arteries patent to the basilar.  Left PICA is a large vessel with a severe proximal stenosis. Right PICA not visualized. Right AICA is present. Basilar widely patent. Superior cerebellar and posterior cerebral arteries patent bilaterally without stenosis. Internal carotid artery widely patent bilaterally. Anterior and middle cerebral arteries patent bilaterally. Moderate stenosis right MCA bifurcation. No significant left MCA stenosis. Anterior cerebral arteries patent bilaterally without stenosis. MRA NECK FINDINGS Antegrade flow in the carotid and vertebral arteries bilaterally Normal aortic arch  and proximal great vessels. Decreased signal in the proximal vertebral artery bilaterally due to tortuosity. No significant stenosis. Carotid bifurcation appears widely patent bilaterally. No carotid stenosis. IMPRESSION: Negative for acute infarct. Small white matter lesion in the right corona radiata may represent a subacute to chronic infarct with T2 shine through. Mild to moderate chronic microvascular ischemic change. MRA head demonstrates no intracranial large vessel occlusion. There is a severe stenosis in the proximal left PICA which is a large vessel. Right PICA not visualized but may be supplied by the right AICA. Moderate stenosis right MCA bifurcation. Electronically Signed   By: Franchot Gallo M.D.   On: 04/03/2021 13:13   DG Swallowing Func-Speech Pathology  Result Date: 04/03/2021 Objective Swallowing Evaluation: Type of Study: MBS-Modified Barium Swallow Study  Patient Details Name: Saraya Tirey MRN: [id number] Date of Birth: 1941/04/23 Today's Date: 04/03/2021 Time: SLP Start Time (ACUTE ONLY): 1356 -SLP Stop Time (ACUTE ONLY): 1423 SLP Time Calculation (min) (ACUTE ONLY): 27 min Past Medical History: Past Medical History: Diagnosis Date . Anxiety  . CAD (coronary artery disease)  . Carotid artery occlusion  . Diabetes mellitus without complication ([place])  . Glaucoma  . History of hiatal hernia  . Hyperlipidemia  . Hypertension  .  Irregular heart beats  . Seasonal allergies  . Sleep apnea  Past Surgical History: Past Surgical History: Procedure Laterality Date . ABDOMINAL HYSTERECTOMY   . CATARACT EXTRACTION Bilateral  . CHOLECYSTECTOMY   . COLONOSCOPY  2017  Dr.Williams in [place] , [place] . COLONOSCOPY WITH PROPOFOL N/A 02/06/2021  Procedure: COLONOSCOPY WITH PROPOFOL;  Surgeon: Harvel Quale, MD;  Location: [facility];  Service: Gastroenterology;  Laterality: N/A;  10:30 . ESOPHAGOGASTRODUODENOSCOPY (EGD) WITH PROPOFOL N/A 02/06/2021  Procedure: ESOPHAGOGASTRODUODENOSCOPY (EGD) WITH PROPOFOL;  Surgeon: Harvel Quale, MD;  Location: [facility];  Service: Gastroenterology;  Laterality: N/A; . heart catherization  [id number], 2010 . RECTOCELE REPAIR   . TONSILLECTOMY   . TUBAL LIGATION  1986 . UPPER GASTROINTESTINAL ENDOSCOPY  2015  Dr.Williams in [place], [place] . VAGINAL HYSTERECTOMY  1991 HPI: Velvia Mehrer  is a 80 y.o. female, with history of sleep apnea, hypertension, hyperlipidemia, diabetes mellitus type 2, coronary artery disease, and more presents to ED with a chief complaint of dysphagia.  Patient reports it started about 3 days ago.  At first she has had the sensation that she had to swallow constantly.  Today became acutely worse where she felt like she could not initiate a swallow.  She has more difficulty with liquids than solids. BSE completed this am recommending MBSS  No data recorded Assessment / Plan / Recommendation CHL IP CLINICAL IMPRESSIONS 04/03/2021 Clinical Impression Pt presents with oral dysphagia seemingly negatively impacted by anxiety and fear of "choking". The pharyngeal stage of the swallow is grossly WFL. Oral stage of swallowing is characterized by lingual pumping, holding of bolus, piecemeal swallowing, decreased bolus cohesion, reduced (fear of) posterior propulsion of bolus and trace amounts of premature spillage to the level of the valleculae. Pt would only take very small amounts  of liquid or solids and when posterior propulsion began, specifically with thin liquids, she would demonstrate an aggressive chin tuck & lean forward to ensure liquids did not fall posteriorly. Later Pt reported to SLP "I could feel it going back into my throat". There were trace amounts of premature spillage of thin liquids to the level of the valleculae, however the swallow was triggered shortly after with adequate pharyngeal squeeze and no pharyngeal  residue. NO penetration or aspiration was visualized on this study; further note good hyolaryngeal excursion and adequate laryngeal vestibule closure. Note prominent UES. SLP provided education to Pt that no penetration or aspiration was visualized and that the behaviors including aggressive chin tuck, only taking tiny sips, holding bolus for prolonged periods are likely not facilitating safe swallowing. Recommend continue with D3/mech soft diet and thin liquids; continue to crush meds and administer in puree as Pt reports inability to swallow pills at this time (would not attempt barium tablet).  SLP will continue to follow acutely for oral dysphagia, education and support. SLP Visit Diagnosis Dysphagia, oral phase (R13.11) Attention and concentration deficit following -- Frontal lobe and executive function deficit following -- Impact on safety and function Risk for inadequate nutrition/hydration   CHL IP TREATMENT RECOMMENDATION 04/03/2021 Treatment Recommendations Therapy as outlined in treatment plan below   Prognosis 04/03/2021 Prognosis for Safe Diet Advancement Good Barriers to Reach Goals -- Barriers/Prognosis Comment -- CHL IP DIET RECOMMENDATION 04/03/2021 SLP Diet Recommendations Dysphagia 3 (Mech soft) solids;Thin liquid Liquid Administration via Cup Medication Administration Crushed with puree Compensations Slow rate;Small sips/bites Postural Changes Remain semi-upright after after feeds/meals (Comment)   CHL IP OTHER RECOMMENDATIONS 04/03/2021 Recommended  Consults -- Oral Care Recommendations Oral care BID Other Recommendations --   No flowsheet data found.  CHL IP FREQUENCY AND DURATION 04/03/2021 Speech Therapy Frequency (ACUTE ONLY) min 1 x/week Treatment Duration 1 week      CHL IP ORAL PHASE 04/03/2021 Oral Phase Impaired Oral - Pudding Teaspoon -- Oral - Pudding Cup -- Oral - Honey Teaspoon -- Oral - Honey Cup -- Oral - Nectar Teaspoon -- Oral - Nectar Cup -- Oral - Nectar Straw -- Oral - Thin Teaspoon Lingual pumping;Reduced posterior propulsion;Holding of bolus;Premature spillage;Decreased bolus cohesion;Delayed oral transit;Piecemeal swallowing;Lingual/palatal residue Oral - Thin Cup Lingual pumping;Reduced posterior propulsion;Holding of bolus;Premature spillage;Decreased bolus cohesion;Delayed oral transit;Piecemeal swallowing;Lingual/palatal residue Oral - Thin Straw Lingual pumping;Reduced posterior propulsion;Holding of bolus;Premature spillage;Decreased bolus cohesion;Delayed oral transit;Piecemeal swallowing;Lingual/palatal residue Oral - Puree Lingual pumping;Reduced posterior propulsion;Holding of bolus;Premature spillage;Decreased bolus cohesion;Delayed oral transit;Piecemeal swallowing;Lingual/palatal residue Oral - Mech Soft NT Oral - Regular Lingual pumping;Reduced posterior propulsion;Holding of bolus;Premature spillage;Decreased bolus cohesion;Delayed oral transit;Piecemeal swallowing;Lingual/palatal residue Oral - Multi-Consistency NT Oral - Pill NT Oral Phase - Comment --  CHL IP PHARYNGEAL PHASE 04/03/2021 Pharyngeal Phase WFL Pharyngeal- Pudding Teaspoon -- Pharyngeal -- Pharyngeal- Pudding Cup -- Pharyngeal -- Pharyngeal- Honey Teaspoon -- Pharyngeal -- Pharyngeal- Honey Cup -- Pharyngeal -- Pharyngeal- Nectar Teaspoon -- Pharyngeal -- Pharyngeal- Nectar Cup -- Pharyngeal -- Pharyngeal- Nectar Straw -- Pharyngeal -- Pharyngeal- Thin Teaspoon -- Pharyngeal -- Pharyngeal- Thin Cup -- Pharyngeal -- Pharyngeal- Thin Straw -- Pharyngeal --  Pharyngeal- Puree -- Pharyngeal -- Pharyngeal- Mechanical Soft -- Pharyngeal -- Pharyngeal- Regular -- Pharyngeal -- Pharyngeal- Multi-consistency -- Pharyngeal -- Pharyngeal- Pill -- Pharyngeal -- Pharyngeal Comment --  No flowsheet data found. Amelia H. Roddie Mc, CCC-SLP Speech Language Pathologist Wende Bushy 04/03/2021, 3:36 PM                 Subjective: Pt reports no specific complaints, only able to eat small amounts at a time.  Able to drink fluids.    Discharge Exam: Vitals:   04/03/21 0907 04/03/21 1147  BP: (!) 128/59 (!) 125/52  Pulse: 73 80  Resp: 16 18  Temp: 98.5 F (36.9 C) 98.1 F (36.7 C)  SpO2: 96% 94%   Vitals:   04/03/21 0322 04/03/21 0456 04/03/21  3086 04/03/21 1147  BP: (!) 143/60 120/60 (!) 128/59 (!) 125/52  Pulse: 70 71 73 80  Resp: 20 20 16 18   Temp: 98 F (36.7 C) 98.4 F (36.9 C) 98.5 F (36.9 C) 98.1 F (36.7 C)  TempSrc:   Oral Oral  SpO2: 97% 96% 96% 94%  Weight:      Height: 5' (1.524 m)      General: Pt is alert, awake, not in acute distress Cardiovascular: RRR, S1/S2 +, no rubs, no gallops Respiratory: CTA bilaterally, no wheezing, no rhonchi Abdominal: Soft, NT, ND, bowel sounds + Extremities: no edema, no cyanosis Neurological: nonfocal exam.    The results of significant diagnostics from this hospitalization (including imaging, microbiology, ancillary and laboratory) are listed below for reference.     Microbiology: Recent Results (from the past 240 hour(s))  Resp Panel by RT-PCR (Flu A&B, Covid) Nasopharyngeal Swab     Status: None   Collection Time: 04/03/21 12:39 AM   Specimen: Nasopharyngeal Swab; Nasopharyngeal(NP) swabs in vial transport medium  Result Value Ref Range Status   SARS Coronavirus 2 by RT PCR NEGATIVE NEGATIVE Final    Comment: (NOTE) SARS-CoV-2 target nucleic acids are NOT DETECTED.  The SARS-CoV-2 RNA is generally detectable in upper respiratory specimens during the acute phase of infection. The  lowest concentration of SARS-CoV-2 viral copies this assay can detect is 138 copies/mL. A negative result does not preclude SARS-Cov-2 infection and should not be used as the sole basis for treatment or other patient management decisions. A negative result may occur with  improper specimen collection/handling, submission of specimen other than nasopharyngeal swab, presence of viral mutation(s) within the areas targeted by this assay, and inadequate number of viral copies(<138 copies/mL). A negative result must be combined with clinical observations, patient history, and epidemiological information. The expected result is Negative.  Fact Sheet for Patients:  EntrepreneurPulse.com.au  Fact Sheet for Healthcare Providers:  IncredibleEmployment.be  This test is no t yet approved or cleared by the [place] FDA and  has been authorized for detection and/or diagnosis of SARS-CoV-2 by FDA under an Emergency Use Authorization (EUA). This EUA will remain  in effect (meaning this test can be used) for the duration of the COVID-19 declaration under Section 564(b)(1) of the Act, 21 U.S.C.section 360bbb-3(b)(1), unless the authorization is terminated  or revoked sooner.       Influenza A by PCR NEGATIVE NEGATIVE Final   Influenza B by PCR NEGATIVE NEGATIVE Final    Comment: (NOTE) The Xpert Xpress SARS-CoV-2/FLU/RSV plus assay is intended as an aid in the diagnosis of influenza from Nasopharyngeal swab specimens and should not be used as a sole basis for treatment. Nasal washings and aspirates are unacceptable for Xpert Xpress SARS-CoV-2/FLU/RSV testing.  Fact Sheet for Patients: EntrepreneurPulse.com.au  Fact Sheet for Healthcare Providers: IncredibleEmployment.be  This test is not yet approved or cleared by the [place] FDA and has been authorized for detection and/or diagnosis of SARS-CoV-2 by FDA under  an Emergency Use Authorization (EUA). This EUA will remain in effect (meaning this test can be used) for the duration of the COVID-19 declaration under Section 564(b)(1) of the Act, 21 U.S.C. section 360bbb-3(b)(1), unless the authorization is terminated or revoked.  Performed at [facility], [address] [place], [place] [address]      Labs: BNP (last 3 results) No results for input(s): BNP in the last 8760 hours. Basic Metabolic Panel: Recent Labs  Lab 04/02/21 2203 04/03/21 0407  NA 141  142  K 3.6 3.4*  CL 102 102  CO2 26 27  GLUCOSE 140* 121*  BUN 18 17  CREATININE 0.70 0.64  CALCIUM 9.8 9.6   Liver Function Tests: Recent Labs  Lab 04/02/21 2203 04/03/21 0407  AST 25 25  ALT 23 20  ALKPHOS 65 61  BILITOT 0.5 0.6  PROT 8.6* 8.1  ALBUMIN 4.6 4.3   No results for input(s): LIPASE, AMYLASE in the last 168 hours. No results for input(s): AMMONIA in the last 168 hours. CBC: Recent Labs  Lab 04/02/21 2203 04/03/21 0407  WBC 11.3* 9.9  NEUTROABS 7.5  --   HGB 12.9 13.0  HCT 40.5 41.3  MCV 95.5 96.9  PLT 345 337   Cardiac Enzymes: No results for input(s): CKTOTAL, CKMB, CKMBINDEX, TROPONINI in the last 168 hours. BNP: Invalid input(s): POCBNP CBG: Recent Labs  Lab 04/03/21 0750 04/03/21 1148  GLUCAP 121* 128*   D-Dimer No results for input(s): DDIMER in the last 72 hours. Hgb A1c Recent Labs    04/03/21 0407  HGBA1C 6.5*   Lipid Profile Recent Labs    04/03/21 0407  CHOL 130  HDL 62  LDLCALC 55  TRIG 65  CHOLHDL 2.1   Thyroid function studies No results for input(s): TSH, T4TOTAL, T3FREE, THYROIDAB in the last 72 hours.  Invalid input(s): FREET3 Anemia work up No results for input(s): VITAMINB12, FOLATE, FERRITIN, TIBC, IRON, RETICCTPCT in the last 72 hours. Urinalysis    Component Value Date/Time   COLORURINE YELLOW 04/02/2021 2152   APPEARANCEUR CLOUDY (A) 04/02/2021 2152   LABSPEC >1.030 (H) 04/02/2021 2152   PHURINE 5.5  04/02/2021 2152   GLUCOSEU NEGATIVE 04/02/2021 2152   HGBUR SMALL (A) 04/02/2021 2152   BILIRUBINUR NEGATIVE 04/02/2021 2152   KETONESUR NEGATIVE 04/02/2021 2152   PROTEINUR TRACE (A) 04/02/2021 2152   NITRITE POSITIVE (A) 04/02/2021 2152   LEUKOCYTESUR SMALL (A) 04/02/2021 2152   Sepsis Labs Invalid input(s): PROCALCITONIN,  WBC,  LACTICIDVEN Microbiology Recent Results (from the past 240 hour(s))  Resp Panel by RT-PCR (Flu A&B, Covid) Nasopharyngeal Swab     Status: None   Collection Time: 04/03/21 12:39 AM   Specimen: Nasopharyngeal Swab; Nasopharyngeal(NP) swabs in vial transport medium  Result Value Ref Range Status   SARS Coronavirus 2 by RT PCR NEGATIVE NEGATIVE Final    Comment: (NOTE) SARS-CoV-2 target nucleic acids are NOT DETECTED.  The SARS-CoV-2 RNA is generally detectable in upper respiratory specimens during the acute phase of infection. The lowest concentration of SARS-CoV-2 viral copies this assay can detect is 138 copies/mL. A negative result does not preclude SARS-Cov-2 infection and should not be used as the sole basis for treatment or other patient management decisions. A negative result may occur with  improper specimen collection/handling, submission of specimen other than nasopharyngeal swab, presence of viral mutation(s) within the areas targeted by this assay, and inadequate number of viral copies(<138 copies/mL). A negative result must be combined with clinical observations, patient history, and epidemiological information. The expected result is Negative.  Fact Sheet for Patients:  EntrepreneurPulse.com.au  Fact Sheet for Healthcare Providers:  IncredibleEmployment.be  This test is no t yet approved or cleared by the [place] FDA and  has been authorized for detection and/or diagnosis of SARS-CoV-2 by FDA under an Emergency Use Authorization (EUA). This EUA will remain  in effect (meaning this test can be  used) for the duration of the COVID-19 declaration under Section 564(b)(1) of the Act, 21 U.S.C.section 360bbb-3(b)(1), unless  the authorization is terminated  or revoked sooner.       Influenza A by PCR NEGATIVE NEGATIVE Final   Influenza B by PCR NEGATIVE NEGATIVE Final    Comment: (NOTE) The Xpert Xpress SARS-CoV-2/FLU/RSV plus assay is intended as an aid in the diagnosis of influenza from Nasopharyngeal swab specimens and should not be used as a sole basis for treatment. Nasal washings and aspirates are unacceptable for Xpert Xpress SARS-CoV-2/FLU/RSV testing.  Fact Sheet for Patients: EntrepreneurPulse.com.au  Fact Sheet for Healthcare Providers: IncredibleEmployment.be  This test is not yet approved or cleared by the [place] FDA and has been authorized for detection and/or diagnosis of SARS-CoV-2 by FDA under an Emergency Use Authorization (EUA). This EUA will remain in effect (meaning this test can be used) for the duration of the COVID-19 declaration under Section 564(b)(1) of the Act, 21 U.S.C. section 360bbb-3(b)(1), unless the authorization is terminated or revoked.  Performed at [facility], [address], [place], [place] [address]     Time coordinating discharge:   SIGNED:  Irwin Brakeman, MD  [facility] 04/03/2021, 4:28 PM How to contact the [facility] Attending or Consulting provider [place] or covering provider during after hours [place], for this patient?  1. Check the care team in [facility] and look for a) attending/consulting [facility] provider listed and b) the [facility] team listed 2. Log into [email or web address] and use [place]'s universal password to access. If you do not have the password, please contact the hospital operator. 3. Locate the [facility] provider you are looking for under [facility] and page to a number that you can be directly reached. 4. If you still have difficulty reaching the provider, please page the [facility]  (Director on Call) for the Hospitalists listed on amion for assistance.

## 2021-04-03 NOTE — Care Management Obs Status (Signed)
[facility] NOTIFICATION   Patient Details  Name: Melinda Acevedo MRN: [id number] Date of Birth: Sep 12, 1941   Medicare Observation Status Notification Given:  Yes    Tommy Medal 04/03/2021, 2:03 PM

## 2021-04-03 NOTE — Consult Note (Addendum)
Maylon Peppers, M.D. Gastroenterology & Hepatology                                           Patient Name: Melinda Acevedo Account #: _0 @   MRN: [id number] Admission Date: 04/02/2021 Date of Evaluation:  04/03/2021 Time of Evaluation: 1:10 PM   Referring Physician: Irwin Brakeman, MD  Chief Complaint: Dysphagia  HPI:  This is a 80 y.o. female with PMH CAD s/p stent placement, carotid artery occlusion, HLD, HTN, pre diabetes, glaucoma, who comes to the hospital after presenting worsening dysphagia.  Patient reports that since the 1980s she has presented occasional episodes of feeling "that the food does not pass from her mouth down to her stomach".  She states that she tries to swallow but occasionally the food will just remain inside of her mouth and taste low to go down.  She became concerned has on Monday she presented worsening nausea for 2 days but she noticed that these episodes of oral dysphagia persisted since Wednesday which did not allow her to eat adequately.  She reports that the dysphagia is present all with solids and liquids.  She denies any choking episodes is feeling frustrated reports "simply will not go down my throat".  She denies having any odynophagia.  Had history of heartburn which has been managed with AcipHex adequately as she does not have any heartburn episodes as long as she takes the medication.  The patient denies having any nausea, vomiting, fever, chills, hematochezia, melena, hematemesis, abdominal distention, abdominal pain, diarrhea, jaundice, pruritus or weight loss.  Notably, the patient saw me in the clinic in January 2022 for episodes of neck pain for which she underwent an EGD and colonoscopy. These procedures were performed on 02/06/2021, esophagogastroduodenospy was normal without presence of any obstruction or alterations in her esophagus, colonoscopy had presence of excessive amount of stool in the hepatic flexure, ascending colon and cecum but did  not allow an adequate visualization of the colon, there was presence of diverticulosis.  Speech and swallow therapist evaluate the patient in the morning but the patient was unable to swallow very small sips and could not perform an adequate evaluation.  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  Past Medical History: SEE CHRONIC ISSSUES: Past Medical History:  Diagnosis Date  . Anxiety   . CAD (coronary artery disease)   . Carotid artery occlusion   . Diabetes mellitus without complication ([place])   . Glaucoma   . History of hiatal hernia   . Hyperlipidemia   . Hypertension   . Irregular heart beats   . Seasonal allergies   . Sleep apnea    Past Surgical History:  Past Surgical History:  Procedure Laterality Date  . ABDOMINAL HYSTERECTOMY    . CATARACT EXTRACTION Bilateral   . CHOLECYSTECTOMY    . COLONOSCOPY  2017   Dr.Williams in [place] , [place]  . COLONOSCOPY WITH PROPOFOL N/A 02/06/2021   Procedure: COLONOSCOPY WITH PROPOFOL;  Surgeon: Harvel Quale, MD;  Location: [facility];  Service: Gastroenterology;  Laterality: N/A;  10:30  . ESOPHAGOGASTRODUODENOSCOPY (EGD) WITH PROPOFOL N/A 02/06/2021   Procedure: ESOPHAGOGASTRODUODENOSCOPY (EGD) WITH PROPOFOL;  Surgeon: Harvel Quale,  MD;  Location: [facility];  Service: Gastroenterology;  Laterality: N/A;  . heart catherization  [id number], 2010  . RECTOCELE REPAIR    . TONSILLECTOMY    . TUBAL LIGATION  1986  . UPPER GASTROINTESTINAL ENDOSCOPY  2015   Dr.Williams in [place], [place]  . VAGINAL HYSTERECTOMY  1991   Family History:  Family History  Problem Relation Age of Onset  . COPD Mother   . Heart disease Mother   . Heart disease Father   . Heart attack Father   . Hypertension  Sister   . Mitral valve prolapse Brother    Social History:  Social History   Tobacco Use  . Smoking status: Former Smoker    Years: 2.00    Types: Cigarettes    Quit date: 1964    Years since quitting: 58.3  . Smokeless tobacco: Never Used  Vaping Use  . Vaping Use: Never used  Substance Use Topics  . Alcohol use: No  . Drug use: No    Home Medications:  Prior to Admission medications   Medication Sig Start Date End Date Taking? Authorizing Provider  amLODipine (NORVASC) 5 MG tablet Take 5 mg by mouth daily.   Yes [provider]  aspirin 81 MG tablet Take 81 mg by mouth daily.   Yes [provider]  benazepril (LOTENSIN) 20 MG tablet Take 1 tablet (20 mg total) by mouth 2 (two) times daily. Patient taking differently: Take 10 mg by mouth 2 (two) times daily. 02/10/21  Yes Branch, Alphonse Guild, MD  BLACK ELDERBERRY PO Take 1 each by mouth daily.   Yes [provider]  Cholecalciferol (VITAMIN D) 125 MCG (5000 UT) CAPS Take 5,000 Units by mouth daily.    Yes [provider]  Coenzyme Q10 (COQ10) 100 MG CAPS Take 200 mg by mouth daily.   Yes [provider]  loratadine (CLARITIN) 10 MG tablet Take 10 mg by mouth daily.   Yes [provider]  Multiple Vitamins-Minerals (MULTIVITAMIN ADULT PO) Take 1 tablet by mouth daily.   Yes [provider]  nitrofurantoin, macrocrystal-monohydrate, (MACROBID) 100 MG capsule Take 100 mg by mouth 2 (two) times daily as needed (UTI). 12/17/19  Yes [provider]  ondansetron (ZOFRAN) 4 MG tablet Take 4 mg by mouth every 8 (eight) hours as needed for vomiting or nausea. 01/19/21  Yes [provider]  RABEprazole (ACIPHEX) 20 MG tablet Take 20 mg by mouth daily.   Yes [provider]  rosuvastatin (CRESTOR) 5 MG tablet Take 5 mg by mouth daily.   Yes [provider]  Travoprost, BAK Free, (TRAVATAN) 0.004 % SOLN ophthalmic solution Place 1 drop into both  eyes at bedtime.   Yes [provider]  nitroGLYCERIN (NITROSTAT) 0.4 MG SL tablet Place 0.4 mg under the tongue every 5 (five) minutes as needed for chest pain.    [provider]    Inpatient Medications:  Current Facility-Administered Medications:  .  0.9 %  sodium chloride infusion, , Intravenous, Continuous, Zierle-Ghosh, Asia B, DO, Last Rate: 75 mL/hr at 04/03/21 0416, New Bag at 04/03/21 0416 .  acetaminophen (TYLENOL) tablet 650 mg, 650 mg, Oral, Q4H PRN **OR** acetaminophen (TYLENOL) 160 MG/5ML solution 650 mg, 650 mg, Per Tube, Q4H PRN **OR** acetaminophen (TYLENOL) suppository 650 mg, 650 mg, Rectal, Q4H PRN, Zierle-Ghosh, Asia B, DO .  aspirin chewable tablet 81 mg, 81 mg, Oral, Daily, Zierle-Ghosh, Asia B, DO, 81 mg at 04/03/21 0900 .  ciprofloxacin (CIPRO) IVPB 400 mg, 400 mg, Intravenous, Q12H, Zierle-Ghosh, Asia B, DO, Last Rate: 200 mL/hr at 04/03/21 0419, 400 mg at 04/03/21 0419 .  heparin injection 5,000 Units, 5,000 Units, Subcutaneous, Q8H, Zierle-Ghosh, Asia B, DO, 5,000 Units at 04/03/21 0640 .  hydrALAZINE (APRESOLINE) injection 10 mg, 10 mg, Intravenous, Q8H PRN, Zierle-Ghosh, Asia B, DO .  insulin aspart (novoLOG) injection 0-15 Units, 0-15 Units, Subcutaneous, TID WC, Zierle-Ghosh, Asia B, DO .  insulin aspart (novoLOG) injection 0-5 Units, 0-5 Units, Subcutaneous, QHS, Zierle-Ghosh, Asia B, DO .  LORazepam (ATIVAN) injection 1 mg, 1 mg, Intravenous, Q6H PRN, Zierle-Ghosh, Asia B, DO, 1 mg at 04/03/21 1020 .  ondansetron (ZOFRAN) injection 4 mg, 4 mg, Intravenous, Q8H PRN, Zierle-Ghosh, Asia B, DO .  senna-docusate (Senokot-S) tablet 1 tablet, 1 tablet, Oral, QHS PRN, Zierle-Ghosh, Asia B, DO Allergies: Contrast media [iodinated diagnostic agents], Alendronate, Cinoxacin, Diltiazem, Keflex [cephalexin], Naproxen, Nifedipine, Norfloxacin, Nsaids, Other, Pravastatin sodium, Trimethoprim, and Valdecoxib  Complete Review of Systems: GENERAL: negative for  malaise, night sweats HEENT: No changes in hearing or vision, no nose bleeds or other nasal problems. NECK: Negative for lumps, goiter, pain and significant neck swelling RESPIRATORY: Negative for cough, wheezing CARDIOVASCULAR: Negative for chest pain, leg swelling, palpitations, orthopnea GI: SEE HPI MUSCULOSKELETAL: Negative for joint pain or swelling, back pain, and muscle pain. SKIN: Negative for lesions, rash PSYCH: Negative for sleep disturbance, mood disorder and recent psychosocial stressors. HEMATOLOGY Negative for prolonged bleeding, bruising easily, and swollen nodes. ENDOCRINE: Negative for cold or heat intolerance, polyuria, polydipsia and goiter. NEURO: negative for tremor, gait imbalance, syncope and seizures. The remainder of the review of systems is noncontributory.  Physical Exam: BP (!) 125/52 (BP Location: Left Arm)   Pulse 80   Temp 98.1 F (36.7 C) (Oral)   Resp 18   Ht 5' (1.524 m)   Wt 71.7 kg   SpO2 94%   BMI 30.86 kg/m  GENERAL: The patient is AO x3, in no acute distress. Elder. HEENT: Head is normocephalic and atraumatic. EOMI are intact. Mouth is well hydrated and without lesions. NECK: Supple. No masses LUNGS: Clear to auscultation. No presence of rhonchi/wheezing/rales. Adequate chest expansion HEART: RRR, normal s1 and s2. ABDOMEN: Soft, nontender, no guarding, no peritoneal signs, and nondistended. BS +. No masses. EXTREMITIES: Without any cyanosis, clubbing, rash, lesions or edema. NEUROLOGIC: AOx3, no focal motor deficit. SKIN: no jaundice, no rashes  Laboratory Data CBC:     Component Value Date/Time   WBC 9.9 04/03/2021 0407   RBC 4.26 04/03/2021 0407   HGB 13.0 04/03/2021 0407   HCT 41.3 04/03/2021 0407   PLT 337 04/03/2021 0407   MCV 96.9 04/03/2021 0407   MCH 30.5 04/03/2021 0407   MCHC 31.5 04/03/2021 0407   RDW 14.4 04/03/2021 0407   LYMPHSABS 2.7 04/02/2021 2203   MONOABS 0.9 04/02/2021 2203   EOSABS 0.1 04/02/2021 2203    BASOSABS 0.1 04/02/2021 2203   COAG:  Lab Results  Component Value Date   INR 1.0 04/02/2021    BMP:  BMP Latest Ref Rng & Units 04/03/2021 04/02/2021 02/04/2021  Glucose 70 - 99 mg/dL 121(H) 140(H) 112(H)  BUN 8 - 23 mg/dL _0 Creatinine 0.44 - 1.00 mg/dL 0.64 0.70 0.64  Sodium 135 - 145 mmol/L 142 141 138  Potassium 3.5 - 5.1 mmol/L 3.4(L) 3.6 3.8  Chloride 98 - 111 mmol/L 102 102 103  CO2 22 - 32 mmol/L _1 Calcium  8.9 - 10.3 mg/dL 9.6 9.8 9.8    HEPATIC:  Hepatic Function Latest Ref Rng & Units 04/03/2021 04/02/2021  Total Protein 6.5 - 8.1 g/dL 8.1 8.6(H)  Albumin 3.5 - 5.0 g/dL 4.3 4.6  AST 15 - 41 U/L 25 25  ALT 0 - 44 U/L 20 23  Alk Phosphatase 38 - 126 U/L 61 65  Total Bilirubin 0.3 - 1.2 mg/dL 0.6 0.5    CARDIAC: No results found for: CKTOTAL, CKMB, CKMBINDEX, TROPONINI   Imaging: I personally reviewed and interpreted the available imaging.  Assessment & Plan: Melinda Acevedo is a 80 y.o. female with PMH CAD s/p stent placement, carotid artery occlusion, HLD, HTN, pre diabetes, glaucoma, who comes to the hospital after presenting worsening dysphagia.  The patient is presenting symptoms that are suggestive of an oropharyngeal occasional dysphagia.  She had a recent evaluation for neck pain with endoscopic evaluation of her upper gastrointestinal tract that did not show any obstruction or mechanical alterations affecting her esophagus.  She is presenting from with swallowing the food from her mouth to her esophagus, I consider that she will benefit from a repeat evaluation by speech and swallow therapy with possible modified barium swallow if she is able to drink more liquids today in the afternoon.  It is very neurological or musculoskeletal abnormality made to her oropharyngeal dysphagia, for which there is no role for any endoscopic intervention at this time.  # oropharyngeal dysphagia -Speech and swallow evaluation and possible modified barium swallow - Consider  further neurological work-up based on MRI findings - Advance diet as tolerated - GI service will sign-off, please call us back if you have any more questions.  Harvel Quale, MD Gastroenterology and Hepatology [facility]

## 2021-04-03 NOTE — Evaluation (Signed)
Clinical/Bedside Swallow Evaluation Patient Details  Name: Melinda Acevedo MRN: [id number] Date of Birth: July 28, 1941  Today's Date: 04/03/2021 Time: SLP Start Time (ACUTE ONLY): 1004 SLP Stop Time (ACUTE ONLY): 1026 SLP Time Calculation (min) (ACUTE ONLY): 22 min  Past Medical History:  Past Medical History:  Diagnosis Date  . Anxiety   . CAD (coronary artery disease)   . Carotid artery occlusion   . Diabetes mellitus without complication ([place])   . Glaucoma   . History of hiatal hernia   . Hyperlipidemia   . Hypertension   . Irregular heart beats   . Seasonal allergies   . Sleep apnea    Past Surgical History:  Past Surgical History:  Procedure Laterality Date  . ABDOMINAL HYSTERECTOMY    . CATARACT EXTRACTION Bilateral   . CHOLECYSTECTOMY    . COLONOSCOPY  2017   Dr.Williams in [place] , [place]  . COLONOSCOPY WITH PROPOFOL N/A 02/06/2021   Procedure: COLONOSCOPY WITH PROPOFOL;  Surgeon: Harvel Quale, MD;  Location: [facility];  Service: Gastroenterology;  Laterality: N/A;  10:30  . ESOPHAGOGASTRODUODENOSCOPY (EGD) WITH PROPOFOL N/A 02/06/2021   Procedure: ESOPHAGOGASTRODUODENOSCOPY (EGD) WITH PROPOFOL;  Surgeon: Harvel Quale, MD;  Location: [facility];  Service: Gastroenterology;  Laterality: N/A;  . heart catherization  [id number], 2010  . RECTOCELE REPAIR    . TONSILLECTOMY    . TUBAL LIGATION  1986  . UPPER GASTROINTESTINAL ENDOSCOPY  2015   Dr.Williams in [place], [place]  . VAGINAL HYSTERECTOMY  1991   HPI:  Melinda Acevedo  is a 80 y.o. female, with history of sleep apnea, hypertension, hyperlipidemia, diabetes mellitus type 2, coronary artery disease, and more presents to ED with a chief complaint of dysphagia.  Patient reports it started about 3 days ago.  At first she has had the sensation that she had to swallow constantly.  Today became acutely worse where she felt like she could not initiate a swallow.  She has more difficulty with liquids  than solids.   Assessment / Plan / Recommendation Clinical Impression  Clinical swallowing evaluation completed while Pt was sitting on the edge of the bed. Oral mechanism is unremarkable and Pt was able to trigger a swallow on command prior to trials. Pt consumed very small (<1/4 tsp sips/bites) of all presentations. She demonstrated a reflexive chin tuck with facial grimacing with all swallows; she denies odynophagia just reports "it's just hard to swallow". Despite reported difficulty triggering a swallow, there were no overt s/sx of aspiration with any of the presentations she consumed (regular, puree & thin). Pt recently had a normal EGD therefore MBSS may be of benefit to visualize the oropharyngeal stage of swallowing. Note however, Pt is consuming such minimal amounts of PO that MBSS may not be appropriate. Also note that anxiety may be a key component in her swallowing difficulty. SLP will see Pt again later today to assess her ability to swallow enough for MBSS to be of benefit. Thank you,  SLP Visit Diagnosis: Dysphagia, unspecified (R13.10)    Aspiration Risk  Mild aspiration risk;Risk for inadequate nutrition/hydration    Diet Recommendation Dysphagia 3 (Mech soft);Thin liquid   Liquid Administration via: Straw;Cup Medication Administration: Crushed with puree Supervision: Patient able to self feed Compensations: Slow rate;Small sips/bites Postural Changes: Seated upright at 90 degrees    Other  Recommendations Oral Care Recommendations: Oral care BID   Follow up Recommendations        Frequency and Duration min 1 x/week  1 week       Prognosis Prognosis for Safe Diet Advancement: Good      Swallow Study   General Date of Onset: 04/02/21 HPI: Melinda Acevedo  is a 80 y.o. female, with history of sleep apnea, hypertension, hyperlipidemia, diabetes mellitus type 2, coronary artery disease, and more presents to ED with a chief complaint of dysphagia.  Patient reports it  started about 3 days ago.  At first she has had the sensation that she had to swallow constantly.  Today became acutely worse where she felt like she could not initiate a swallow.  She has more difficulty with liquids than solids. Type of Study: Bedside Swallow Evaluation Previous Swallow Assessment: none in chart Diet Prior to this Study: Dysphagia 3 (soft);Thin liquids Temperature Spikes Noted: No Respiratory Status: Room air History of Recent Intubation: No Behavior/Cognition: Alert;Cooperative;Pleasant mood Oral Cavity Assessment: Within Functional Limits Oral Care Completed by SLP: Recent completion by staff Oral Cavity - Dentition: Adequate natural dentition Vision: Functional for self-feeding Self-Feeding Abilities: Able to feed self Patient Positioning: Upright in bed Baseline Vocal Quality: Normal Volitional Cough: Strong Volitional Swallow: Able to elicit    Oral/Motor/Sensory Function Overall Oral Motor/Sensory Function: Within functional limits   Ice Chips Ice chips: Within functional limits   Thin Liquid Thin Liquid: Within functional limits    Nectar Thick Nectar Thick Liquid: Not tested   Honey Thick Honey Thick Liquid: Not tested   Puree Puree: Within functional limits   Solid     Solid: Within functional limits     Azzan Butler H. Roddie Mc, CCC-SLP Speech Language Pathologist  Wende Bushy 04/03/2021,12:28 PM

## 2021-04-03 NOTE — H&P (Signed)
TRH H&P    Patient Demographics:    Melinda Acevedo, is a 80 y.o. female  MRN: [id number]  DOB - Jun 14, 1941  Admit Date - 04/02/2021  Referring MD/NP/PA: Betsey Holiday  Outpatient Primary MD for the patient is Sherrilee Gilles, DO  Patient coming from: Home  Chief complaint- dysphagia   HPI:    Melinda Acevedo  is a 80 y.o. female, with history of sleep apnea, hypertension, hyperlipidemia, diabetes mellitus type 2, coronary artery disease, and more presents to ED with a chief complaint of dysphagia.  Patient reports it started about 3 days ago.  At first she has had the sensation that she had to swallow constantly.  Today became acutely worse where she felt like she could not initiate a swallow.  She has more difficulty with liquids than solids.  She reports that she crush some of her medications and mix them with ice cream to take them this morning.  She reports some associated nausea for which she took Zofran at home and felt better.  Patient has also had dizziness for about 1 week and is unsure if it is related.  The dizziness is intermittent, and does not necessarily happen more upon standing then when at rest.  She thought the dizziness was due to her cardiologist recently increasing her blood pressure medications, but that happened the first week of March and she has not otherwise had any symptoms from it.  Patient does report that she had a severe headache 2 weeks ago.  It lasted for split-second and was gone.  She reports it was located in her left temple and was not associated with any change in vision or change in hearing.  Patient denies any pain in her neck with this dysphagia.  She does report that in September 2021 she had pain on both sides of her neck with any exertion.  She reports having a full work-up including ultrasound of her carotids that was negative for any acute problem.  Patient reports that at that time she  also was referred to GI and endoscopy and noted no acute problems either.  Of note patient reports that in the 80s she had a severe choking episode on Coca-Cola, and she has had anxiety about swallowing since then.  She reports that she tries to concentrate to make sure that she does not aspirate.  She denies any stressors that could be increasing her anxiety regarding this at this time.  On review of systems patient denies urinary frequency.  She reports that she gets chronic UTIs and has a "self start" antibiotic at home to take when she feels UTI symptoms developing.  She has not started his antibiotic.  She thinks that it Cipro, but on her med rec it appears that it might be Macrobid.  Patient does report that she is taking Cipro at home.  We discussed Rocephin and patient reports that she had an allergic reaction to Keflex and does not want to take Rocephin.  Patient does not smoke, does not drink, does not use illicit drugs.  Patient  is full code.  Patient is vaccinated for COVID.  In the ED Temp 97.7, heart rate 75-88, respiratory 17-22, blood pressure 150/64, satting at 95% White blood cell count 11.3, hemoglobin 12.9, platelets 345 Chemistry is unremarkable UA is borderline CT head shows no acute intracranial process UDS is negative, alcohol level is less than 10 EKG heart rate 76, sinus rhythm, QTC 388 Telemetry neuro consulted and recommends stroke work-up in a.m., continue daily aspirin, consider dual antiplatelet therapy it is positive for ischemic stroke finding Admission requested for stroke work-up    Review of systems:    In addition to the HPI above,  No Fever-chills,  No changes with Vision or hearing, Admits to problems swallowing food and liquids, No Chest pain, Cough or Shortness of Breath, No Abdominal pain, admits to nausea but no vomiting, bowel movements are regular, No Blood in stool or Urine, No dysuria, No new skin rashes or bruises, No new joints pains-aches,   No new weakness, tingling, numbness in any extremity, No recent weight gain or loss, Admits to polyuria, but no polydypsia or polyphagia, No significant Mental Stressors.  All other systems reviewed and are negative.    Past History of the following :    Past Medical History:  Diagnosis Date  . CAD (coronary artery disease)   . Carotid artery occlusion   . Diabetes mellitus without complication ([place])   . Glaucoma   . History of hiatal hernia   . Hyperlipidemia   . Hypertension   . Irregular heart beats   . Seasonal allergies   . Sleep apnea       Past Surgical History:  Procedure Laterality Date  . ABDOMINAL HYSTERECTOMY    . CATARACT EXTRACTION Bilateral   . CHOLECYSTECTOMY    . COLONOSCOPY  2017   Dr.Williams in [place] , [place]  . COLONOSCOPY WITH PROPOFOL N/A 02/06/2021   Procedure: COLONOSCOPY WITH PROPOFOL;  Surgeon: Harvel Quale, MD;  [facility]: [facility];  Service: Gastroenterology;  Laterality: N/A;  10:30  . ESOPHAGOGASTRODUODENOSCOPY (EGD) WITH PROPOFOL N/A 02/06/2021   Procedure: ESOPHAGOGASTRODUODENOSCOPY (EGD) WITH PROPOFOL;  Surgeon: Harvel Quale, MD;  Location: [facility];  Service: Gastroenterology;  Laterality: N/A;  . heart catherization  [id number], 2010  . RECTOCELE REPAIR    . TONSILLECTOMY    . TUBAL LIGATION  1986  . UPPER GASTROINTESTINAL ENDOSCOPY  2015   Dr.Williams in [place], [place]      Social History:      Social History   Tobacco Use  . Smoking status: Former Smoker    Years: 2.00    Types: Cigarettes    Quit date: 1964    Years since quitting: 58.3  . Smokeless tobacco: Never Used  Substance Use Topics  . Alcohol use: No       Family History :     Family History  Problem Relation Age of Onset  . COPD Mother   . Heart disease Mother   . Heart disease Father   . Heart attack Father   . Hypertension Sister   . Mitral valve prolapse Brother       Home  Medications:   Prior to Admission medications   Medication Sig Start Date End Date Taking? Authorizing Provider  amLODipine (NORVASC) 5 MG tablet Take 5 mg by mouth daily.    [provider]  aspirin 81 MG tablet Take 81 mg by mouth daily.    [provider]  benazepril (LOTENSIN) 20 MG tablet Take 1 tablet (20 mg total) by mouth 2 (two) times daily. Patient taking differently: Take 40 mg by mouth 2 (two) times daily. 02/10/21   Arnoldo Lenis, MD  BLACK ELDERBERRY PO Take 1 each by mouth daily.    [provider]  Cholecalciferol (VITAMIN D) 125 MCG (5000 UT) CAPS Take 5,000 Units by mouth daily.     [provider]  Coenzyme Q10 (COQ10) 100 MG CAPS Take 200 mg by mouth daily.    [provider]  loratadine (CLARITIN) 10 MG tablet Take 10 mg by mouth daily.    [provider]  Menthol, Topical Analgesic, (BIOFREEZE EX) Apply topically. 03/03/18   [provider]  Multiple Vitamins-Minerals (MULTIVITAMIN ADULT PO) Take 1 tablet by mouth daily.    [provider]  nitrofurantoin, macrocrystal-monohydrate, (MACROBID) 100 MG capsule Take 100 mg by mouth 2 (two) times daily as needed (UTI). 12/17/19   [provider]  nitroGLYCERIN (NITROSTAT) 0.4 MG SL tablet Place 0.4 mg under the tongue every 5 (five) minutes as needed for chest pain.    [provider]  ondansetron (ZOFRAN) 4 MG tablet Take 4 mg by mouth every 8 (eight) hours as needed. 01/19/21   [provider]  RABEprazole (ACIPHEX) 20 MG tablet Take 20 mg by mouth daily.    [provider]  rosuvastatin (CRESTOR) 5 MG tablet Take 5 mg by mouth daily.    [provider]  Travoprost, BAK Free, (TRAVATAN) 0.004 % SOLN ophthalmic solution Place 1 drop into both eyes at bedtime.    [provider]     Allergies:     Allergies  Allergen Reactions  . Contrast Media [Iodinated Diagnostic Agents] Shortness Of Breath  .  Alendronate   . Cinoxacin Hives  . Diltiazem   . Keflex [Cephalexin] Hives  . Naproxen Other (See Comments)    Stomach upset  . Nifedipine   . Norfloxacin   . Nsaids   . Other   . Pravastatin Sodium   . Trimethoprim   . Valdecoxib      Physical Exam:   Vitals  Blood pressure (!) 150/64, pulse 81, temperature 97.7 F (36.5 C), temperature source Oral, resp. rate 17, height 5' (1.524 m), weight 71.7 kg, SpO2 95 %.  1.  General: Patient lying supine in bed in no acute distress  2. Psychiatric: Anxious mood, pleasant, cooperative with exam, alert and oriented x3  3. Neurologic: Face is symmetric-no facial droop or drooling, speech and language are normal, moves all 4 extremities voluntarily, coordination is intact with finger-to-nose and heel-to-shin test, equal sensation in the upper and lower extremities bilaterally  4. HEENMT:  Head is atraumatic, normocephalic, pupils reactive to light, neck is supple, trachea is midline, mucous membranes are moist  5. Respiratory : Lungs are clear to auscultation bilaterally without wheezes, rhonchi, rales, clubbing, cyanosis  6. Cardiovascular : Heart rate is normal, rhythm is regular, no murmurs rubs or gallops, no peripheral edema  7. Gastrointestinal:  Abdomen is soft, not distended, nontender to palpation, bowel sounds active  8. Skin:  Skin is warm dry and intact without acute lesions on limited exam  9.Musculoskeletal:  Peripheral pulses intact, no calf tenderness, no acute deformity    Data Review:    CBC Recent Labs  Lab 04/02/21 2203  WBC 11.3*  HGB 12.9  HCT 40.5  PLT 345  MCV 95.5  MCH 30.4  MCHC 31.9  RDW 14.2  LYMPHSABS 2.7  MONOABS 0.9  EOSABS 0.1  BASOSABS 0.1   ------------------------------------------------------------------------------------------------------------------  Results for orders placed or performed during the hospital encounter of 04/02/21 (from the past 48 hour(s))  Urine rapid  drug screen (hosp performed)     Status: None   Collection Time: 04/02/21  9:52 PM  Result Value Ref Range   Opiates NONE DETECTED NONE DETECTED   Cocaine NONE DETECTED NONE DETECTED   Benzodiazepines NONE DETECTED NONE DETECTED   Amphetamines NONE DETECTED NONE DETECTED   Tetrahydrocannabinol NONE DETECTED NONE DETECTED   Barbiturates NONE DETECTED NONE DETECTED    Comment: (NOTE) DRUG SCREEN FOR MEDICAL PURPOSES ONLY.  IF CONFIRMATION IS NEEDED FOR ANY PURPOSE, NOTIFY LAB WITHIN 5 DAYS.  LOWEST DETECTABLE LIMITS FOR URINE DRUG SCREEN Drug Class                     Cutoff (ng/mL) Amphetamine and metabolites    1000 Barbiturate and metabolites    200 Benzodiazepine                 093 Tricyclics and metabolites     300 Opiates and metabolites        300 Cocaine and metabolites        300 THC                            50 Performed at [facility], [address], [place], [place] [address]   Urinalysis, Routine w reflex microscopic Urine, Clean Catch     Status: Abnormal   Collection Time: 04/02/21  9:52 PM  Result Value Ref Range   Color, Urine YELLOW YELLOW   APPearance CLOUDY (A) CLEAR   Specific Gravity, Urine >1.030 (H) 1.005 - 1.030   pH 5.5 5.0 - 8.0   Glucose, UA NEGATIVE NEGATIVE mg/dL   Hgb urine dipstick SMALL (A) NEGATIVE   Bilirubin Urine NEGATIVE NEGATIVE   Ketones, ur NEGATIVE NEGATIVE mg/dL   Protein, ur TRACE (A) NEGATIVE mg/dL   Nitrite POSITIVE (A) NEGATIVE   Leukocytes,Ua SMALL (A) NEGATIVE    Comment: Performed at [facility], [address], [place], [place] [address]  Urinalysis, Microscopic (reflex)     Status: Abnormal   Collection Time: 04/02/21  9:52 PM  Result Value Ref Range   RBC / HPF NONE SEEN 0 - 5 RBC/hpf   WBC, UA 6-10 0 - 5 WBC/hpf   Bacteria, UA MANY (A) NONE SEEN   Squamous Epithelial / LPF 0-5 0 - 5    Comment: Performed at [facility], [address], [place], [place] [address]  Ethanol     Status: None   Collection Time:  04/02/21 10:03 PM  Result Value Ref Range   Alcohol, Ethyl (B) <10 <10 mg/dL    Comment: (NOTE) Lowest detectable limit for serum alcohol is 10 mg/dL.  For medical purposes only. Performed at [facility], [address], [place], [place] [address]   Protime-INR     Status: None   Collection Time: 04/02/21 10:03 PM  Result Value Ref Range   Prothrombin Time 13.2 11.4 - 15.2 seconds   INR 1.0 0.8 - 1.2    Comment: (NOTE) INR goal varies based on device and disease states. Performed at [facility], [address] [place], [place] [address]   APTT     Status: None   Collection Time: 04/02/21 10:03 PM  Result Value Ref Range  aPTT 28 24 - 36 seconds    Comment: Performed at [facility], [address] [place], [place] [address]  CBC     Status: Abnormal   Collection Time: 04/02/21 10:03 PM  Result Value Ref Range   WBC 11.3 (H) 4.0 - 10.5 K/uL   RBC 4.24 3.87 - 5.11 MIL/uL   Hemoglobin 12.9 12.0 - 15.0 g/dL   HCT 40.5 36.0 - 46.0 %   MCV 95.5 80.0 - 100.0 fL   MCH 30.4 26.0 - 34.0 pg   MCHC 31.9 30.0 - 36.0 g/dL   RDW 14.2 11.5 - 15.5 %   Platelets 345 150 - 400 K/uL   nRBC 0.0 0.0 - 0.2 %    Comment: Performed at [facility], [address], [place], [place] [address]  Differential     Status: None   Collection Time: 04/02/21 10:03 PM  Result Value Ref Range   Neutrophils Relative % 67 %   Neutro Abs 7.5 1.7 - 7.7 K/uL   Lymphocytes Relative 24 %   Lymphs Abs 2.7 0.7 - 4.0 K/uL   Monocytes Relative 8 %   Monocytes Absolute 0.9 0.1 - 1.0 K/uL   Eosinophils Relative 1 %   Eosinophils Absolute 0.1 0.0 - 0.5 K/uL   Basophils Relative 0 %   Basophils Absolute 0.1 0.0 - 0.1 K/uL   Immature Granulocytes 0 %   Abs Immature Granulocytes 0.02 0.00 - 0.07 K/uL    Comment: Performed at [facility], [address] [place], [place] [address]  Comprehensive metabolic panel     Status: Abnormal   Collection Time: 04/02/21 10:03 PM  Result Value Ref Range   Sodium 141 135 - 145  mmol/L   Potassium 3.6 3.5 - 5.1 mmol/L   Chloride 102 98 - 111 mmol/L   CO2 26 22 - 32 mmol/L   Glucose, Bld 140 (H) 70 - 99 mg/dL    Comment: Glucose reference range applies only to samples taken after fasting for at least 8 hours.   BUN 18 8 - 23 mg/dL   Creatinine, Ser 0.70 0.44 - 1.00 mg/dL   Calcium 9.8 8.9 - 10.3 mg/dL   Total Protein 8.6 (H) 6.5 - 8.1 g/dL   Albumin 4.6 3.5 - 5.0 g/dL   AST 25 15 - 41 U/L   ALT 23 0 - 44 U/L   Alkaline Phosphatase 65 38 - 126 U/L   Total Bilirubin 0.5 0.3 - 1.2 mg/dL   GFR, Estimated >60 >60 mL/min    Comment: (NOTE) Calculated using the CKD-EPI Creatinine Equation (2021)    Anion gap 13 5 - 15    Comment: Performed at [facility], [address], [place], [place] [address]  Resp Panel by RT-PCR (Flu A&B, Covid) Nasopharyngeal Swab     Status: None   Collection Time: 04/03/21 12:39 AM   Specimen: Nasopharyngeal Swab; Nasopharyngeal(NP) swabs in vial transport medium  Result Value Ref Range   SARS Coronavirus 2 by RT PCR NEGATIVE NEGATIVE    Comment: (NOTE) SARS-CoV-2 target nucleic acids are NOT DETECTED.  The SARS-CoV-2 RNA is generally detectable in upper respiratory specimens during the acute phase of infection. The lowest concentration of SARS-CoV-2 viral copies this assay can detect is 138 copies/mL. A negative result does not preclude SARS-Cov-2 infection and should not be used as the sole basis for treatment or other patient management decisions. A negative result may occur with  improper specimen collection/handling, submission of specimen other than nasopharyngeal  swab, presence of viral mutation(s) within the areas targeted by this assay, and inadequate number of viral copies(<138 copies/mL). A negative result must be combined with clinical observations, patient history, and epidemiological information. The expected result is Negative.  Fact Sheet for Patients:  EntrepreneurPulse.com.au  Fact Sheet for  Healthcare Providers:  IncredibleEmployment.be  This test is no t yet approved or cleared by the [place] FDA and  has been authorized for detection and/or diagnosis of SARS-CoV-2 by FDA under an Emergency Use Authorization (EUA). This EUA will remain  in effect (meaning this test can be used) for the duration of the COVID-19 declaration under Section 564(b)(1) of the Act, 21 U.S.C.section 360bbb-3(b)(1), unless the authorization is terminated  or revoked sooner.       Influenza A by PCR NEGATIVE NEGATIVE   Influenza B by PCR NEGATIVE NEGATIVE    Comment: (NOTE) The Xpert Xpress SARS-CoV-2/FLU/RSV plus assay is intended as an aid in the diagnosis of influenza from Nasopharyngeal swab specimens and should not be used as a sole basis for treatment. Nasal washings and aspirates are unacceptable for Xpert Xpress SARS-CoV-2/FLU/RSV testing.  Fact Sheet for Patients: EntrepreneurPulse.com.au  Fact Sheet for Healthcare Providers: IncredibleEmployment.be  This test is not yet approved or cleared by the [place] FDA and has been authorized for detection and/or diagnosis of SARS-CoV-2 by FDA under an Emergency Use Authorization (EUA). This EUA will remain in effect (meaning this test can be used) for the duration of the COVID-19 declaration under Section 564(b)(1) of the Act, 21 U.S.C. section 360bbb-3(b)(1), unless the authorization is terminated or revoked.  Performed at [facility], [address], [place], [place] [address]     Chemistries  Recent Labs  Lab 04/02/21 2203  NA 141  K 3.6  CL 102  CO2 26  GLUCOSE 140*  BUN 18  CREATININE 0.70  CALCIUM 9.8  AST 25  ALT 23  ALKPHOS 65  BILITOT 0.5    ------------------------------------------------------------------------------------------------------------------  ------------------------------------------------------------------------------------------------------------------ GFR: Estimated Creatinine Clearance: 51.2 mL/min (by C-G formula based on SCr of 0.7 mg/dL). Liver Function Tests: Recent Labs  Lab 04/02/21 2203  AST 25  ALT 23  ALKPHOS 65  BILITOT 0.5  PROT 8.6*  ALBUMIN 4.6   No results for input(s): LIPASE, AMYLASE in the last 168 hours. No results for input(s): AMMONIA in the last 168 hours. Coagulation Profile: Recent Labs  Lab 04/02/21 2203  INR 1.0   Cardiac Enzymes: No results for input(s): CKTOTAL, CKMB, CKMBINDEX, TROPONINI in the last 168 hours. BNP (last 3 results) No results for input(s): PROBNP in the last 8760 hours. HbA1C: No results for input(s): HGBA1C in the last 72 hours. CBG: No results for input(s): GLUCAP in the last 168 hours. Lipid Profile: No results for input(s): CHOL, HDL, LDLCALC, TRIG, CHOLHDL, LDLDIRECT in the last 72 hours. Thyroid Function Tests: No results for input(s): TSH, T4TOTAL, FREET4, T3FREE, THYROIDAB in the last 72 hours. Anemia Panel: No results for input(s): VITAMINB12, FOLATE, FERRITIN, TIBC, IRON, RETICCTPCT in the last 72 hours.  --------------------------------------------------------------------------------------------------------------- Urine analysis:    Component Value Date/Time   COLORURINE YELLOW 04/02/2021 2152   APPEARANCEUR CLOUDY (A) 04/02/2021 2152   LABSPEC >1.030 (H) 04/02/2021 2152   PHURINE 5.5 04/02/2021 2152   GLUCOSEU NEGATIVE 04/02/2021 2152   HGBUR SMALL (A) 04/02/2021 2152   BILIRUBINUR NEGATIVE 04/02/2021 2152   KETONESUR NEGATIVE 04/02/2021 2152   PROTEINUR TRACE (A) 04/02/2021 2152   NITRITE POSITIVE (A) 04/02/2021 2152   LEUKOCYTESUR SMALL (A)  04/02/2021 2152      Imaging Results:    CT HEAD WO CONTRAST  Result Date:  04/02/2021 CLINICAL DATA:  Difficulty swallowing for 4 days EXAM: CT HEAD WITHOUT CONTRAST TECHNIQUE: Contiguous axial images were obtained from the base of the skull through the vertex without intravenous contrast. COMPARISON:  None. FINDINGS: Brain: No acute infarct or hemorrhage. Hypodensities within the bilateral parietal periventricular white matter most consistent with chronic small vessel ischemic change. Lateral ventricles and remaining midline structures are unremarkable. No acute extra-axial fluid collections. No mass effect. Vascular: No hyperdense vessel or unexpected calcification. Skull: Normal. Negative for fracture or focal lesion. Sinuses/Orbits: No acute finding. Other: None. IMPRESSION: 1. No acute intracranial process. Electronically Signed   By: Randa Ngo M.D.   On: 04/02/2021 22:54       Assessment & Plan:    Active Problems:   Dysphagia   1. Dysphagia 1. Starting 3 days ago with acute worsening today 2. CT head shows no acute abnormality 3. Telemetry neuro recommends stroke work-up 4. MRI/MRA tomorrow if patient is able to tolerate it -reports severe anxiety with MRI 5. ST/OT/PT eval and treat 6. Echo 7. Holding p.o. medications/strict n.p.o. 8. Maintenance fluids 9. Continue to monitor 2. UTI 1. UA borderline 2. Leukocytosis at 11.3 3. Urinary symptoms 4. Allergy to cephalosporins 5. Start Cipro 3. Hypertension 1. Holding p.o. medications 2. As needed hydralazine 4. Hyperglycemia in setting of diabetes mellitus 1. Sliding scale coverage 2. Continue to monitor 5.    DVT Prophylaxis-   Heparin- SCDs   AM Labs Ordered, also please review Full Orders  Family Communication: No family at bedside Code Status: Full Admission status: ObservationTime spent in minutes : [place]

## 2021-04-03 NOTE — Evaluation (Signed)
Physical Therapy Evaluation Patient Details Name: Melinda Acevedo MRN: [id number] DOB: 03-May-1941 Today's Date: 04/03/2021   History of Present Illness  Trudie Cervantes  is a 80 y.o. female, with history of sleep apnea, hypertension, hyperlipidemia, diabetes mellitus type 2, coronary artery disease, and more presents to ED with a chief complaint of dysphagia.  Patient reports it started about 3 days ago.  At first she has had the sensation that she had to swallow constantly.  Today became acutely worse where she felt like she could not initiate a swallow.  She has more difficulty with liquids than solids.  She reports that she crush some of her medications and mix them with ice cream to take them this morning.  She reports some associated nausea for which she took Zofran at home and felt better.  Patient has also had dizziness for about 1 week and is unsure if it is related.  The dizziness is intermittent, and does not necessarily happen more upon standing then when at rest.  She thought the dizziness was due to her cardiologist recently increasing her blood pressure medications, but that happened the first week of March and she has not otherwise had any symptoms from it.  Patient does report that she had a severe headache 2 weeks ago.  It lasted for split-second and was gone.  She reports it was located in her left temple and was not associated with any change in vision or change in hearing.  Patient denies any pain in her neck with this dysphagia.  She does report that in September 2021 she had pain on both sides of her neck with any exertion.  She reports having a full work-up including ultrasound of her carotids that was negative for any acute problem.  Patient reports that at that time she also was referred to GI and endoscopy and noted no acute problems either.  Of note patient reports that in the 80s she had a severe choking episode on Coca-Cola, and she has had anxiety about swallowing since then.  She  reports that she tries to concentrate to make sure that she does not aspirate.  She denies any stressors that could be increasing her anxiety regarding this at this time.  On review of systems patient denies urinary frequency.  She reports that she gets chronic UTIs and has a "self start" antibiotic at home to take when she feels UTI symptoms developing.  She has not started his antibiotic.  She thinks that it Cipro, but on her med rec it appears that it might be Macrobid.  Patient does report that she is taking Cipro at home.  We discussed Rocephin and patient reports that she had an allergic reaction to Keflex and does not want to take Rocephin.     Clinical Impression  Patient functioning near baseline for functional mobility and gait and does not require assist for mobility today. Patient transitions to EOB without assist and demonstrates good sitting balance and tolerance. She transfers to standing and ambulates without AD but does use walls intermittently for support initially. Patient ambulates with slightly decreased cadence from baseline without loss of balance. Patient ends session seated in chair. Patient discharged to care of nursing for ambulation daily as tolerated for length of stay.      Follow Up Recommendations No PT follow up    Equipment Recommendations  None recommended by PT    Recommendations for Other Services       Precautions / Restrictions Precautions Precautions: Fall Restrictions  Weight Bearing Restrictions: No      Mobility  Bed Mobility Overal bed mobility: Independent                  Transfers Overall transfer level: Independent Equipment used: None                Ambulation/Gait Ambulation/Gait assistance: Modified independent (Device/Increase time) Gait Distance (Feet): 150 Feet Assistive device: None Gait Pattern/deviations: Decreased step length - right;Decreased step length - left;Step-through pattern;Decreased stride  length Gait velocity: decreased   General Gait Details: slighlty slow, intially reaching for walls for support, no loss of balance  Stairs            Wheelchair Mobility    Modified Rankin (Stroke Patients Only)       Balance Overall balance assessment: Needs assistance Sitting-balance support: No upper extremity supported;Feet supported Sitting balance-Leahy Scale: Normal Sitting balance - Comments: seated EOB   Standing balance support: No upper extremity supported;During functional activity Standing balance-Leahy Scale: Good Standing balance comment: good without AD                             Pertinent Vitals/Pain Pain Assessment: No/denies pain    Home Living Family/patient expects to be discharged to:: Private residence Living Arrangements: Spouse/significant other Available Help at Discharge: Family Type of Home: House Home Access: Stairs to enter Entrance Stairs-Rails: None Entrance Stairs-Number of Steps: 3 Home Layout: Two level;Able to live on main level with bedroom/bathroom Home Equipment: Gilford Rile - 2 wheels;Cane - single point;Grab bars - tub/shower      Prior Function Level of Independence: Independent         Comments: patient states independent with ADL, short distance community ambulator without AD     Hand Dominance        Extremity/Trunk Assessment   Upper Extremity Assessment Upper Extremity Assessment: Defer to OT evaluation    Lower Extremity Assessment Lower Extremity Assessment: Overall WFL for tasks assessed    Cervical / Trunk Assessment Cervical / Trunk Assessment: Normal  Communication   Communication: No difficulties  Cognition Arousal/Alertness: Awake/alert Behavior During Therapy: WFL for tasks assessed/performed Overall Cognitive Status: Within Functional Limits for tasks assessed                                        General Comments      Exercises     Assessment/Plan     PT Assessment Patent does not need any further PT services  PT Problem List         PT Treatment Interventions      PT Goals (Current goals can be found in the Care Plan section)  Acute Rehab PT Goals Patient Stated Goal: Return home PT Goal Formulation: With patient Time For Goal Achievement: 04/03/21 Potential to Achieve Goals: Good    Frequency     Barriers to discharge        Co-evaluation PT/OT/SLP Co-Evaluation/Treatment: Yes Reason for Co-Treatment: Complexity of the patient's impairments (multi-system involvement);To address functional/ADL transfers PT goals addressed during session: Mobility/safety with mobility;Balance;Strengthening/ROM         AM-PAC PT "6 Clicks" Mobility  Outcome Measure Help needed turning from your back to your side while in a flat bed without using bedrails?: None Help needed moving from lying on your back to sitting on the side  of a flat bed without using bedrails?: None Help needed moving to and from a bed to a chair (including a wheelchair)?: None Help needed standing up from a chair using your arms (e.g., wheelchair or bedside chair)?: None Help needed to walk in hospital room?: None Help needed climbing 3-5 steps with a railing? : A Little 6 Click Score: 23    End of Session   Activity Tolerance: Patient tolerated treatment well Patient left: in chair;with call bell/phone within reach Nurse Communication: Mobility status PT Visit Diagnosis: Other abnormalities of gait and mobility (R26.89);Other symptoms and signs involving the nervous system (R29.898)    Time: 4287-6811 PT Time Calculation (min) (ACUTE ONLY): 12 min   Charges:   PT Evaluation $PT Eval Low Complexity: 1 Low          9:17 AM, 04/03/21 Mearl Latin PT, DPT Physical Therapist at [facility]

## 2021-04-03 NOTE — Discharge Instructions (Signed)
Referral made for neurology follow up appointment.   Swallow Evaluation Recommendations   SLP Diet Recommendations: Dysphagia 3 (Mech soft) solids;Thin liquid   Liquid Administration via: Cup   Medication Administration: Crushed with puree   Supervision: Patient able to self feed   Compensations: Slow rate;Small sips/bites   Postural Changes: Remain semi-upright after after feeds/meals (Comment)   Oral Care Recommendations: Oral care BID       Dysphagia Eating Plan, Bite Size Food This eating plan is for people with moderate swallowing problems who have transitioned from pureed and minced foods. Bite size foods are soft and cut into small chunks so that they can be swallowed safely. On this eating plan, you may be instructed to drink liquids that are thickened. Work with your health care provider and your diet and nutrition specialist (dietitian) to make sure that you are following the eating plan safely and getting all the nutrients you need. What are tips for following this plan? General information  You may eat foods that are tender, soft, and moist.  Always test food texture before taking a bite. Poke food with a fork or spoon to make sure it is tender. The test sample should squash, break apart, or change shape, and it should not return to its original shape when the fork or spoon is removed.  Food should be easy to cut and chew. Avoid large pieces of food that require a lot of chewing.  Take small bites. Each bite should be smaller than your thumb nail (about 15 mm by 15 mm).  If you were on a pureed or minced food eating plan, you may eat any of the foods included in those diets.  Avoid foods that are very dry, hard, sticky, chewy, coarse, or crunchy.  If instructed by your health care provider, thicken liquids. Follow your health care provider's instructions for what products to use, how to do this, and to what thickness. ? Your health care provider may  recommend using a commercial thickener, rice cereal, or potato flakes. Ask your health care provider to recommend thickeners. ? Thickened liquids are usually a "pudding-like" consistency, or they may be as thick as honey or thick enough to eat with a spoon.   Cooking  To moisten foods, you may add liquids while you are blending, mashing, or grinding your foods to the right consistency. These liquids include gravies, sauces, vegetable or fruit juice, milk, half and half, or water.  Strain extra liquid from foods before eating.  Reheat foods slowly to prevent a tough crust from forming.  Prepare foods in advance. Meal planning  Eat a variety of foods to get all the nutrients you need.  Some foods may be tolerated better than others. Work with your dietitian to identify which foods are safest for you to eat.  Follow your meal plan as told by your dietitian. What foods can I eat? Grains Moist breads without nuts or seeds. Biscuits, muffins, pancakes, and waffles that are well-moistened with syrup, jelly, margarine, or butter. Cooked cereals. Moist bread stuffing. Moist rice. Well-moistened cold cereal with small chunks. Well-cooked pasta, noodles, rice, and bread dressing in small pieces and thick sauce. Soft dumplings or spaetzle in small pieces and butter or gravy. Fruits Canned or cooked fruits that are soft or moist and do not have skin or seeds. Fresh, soft bananas. Thickened fruit juices. Vegetables Soft, well-cooked vegetables in small pieces. Soft-cooked, mashed potatoes. Thickened vegetable juice. Meats and other proteins Tender, moist meats or poultry  in small pieces. Moist meatballs or meatloaf. Fish without bones. Eggs or egg substitutes in small pieces. Tofu. [place] and meat alternatives in small pieces. Well-cooked, tender beans, peas, baked beans, and other legumes. Dairy Thickened milk. Cream cheese. Yogurt. Cottage cheese. Sour cream. Small pieces of soft cheese. Fats and  oils Butter. Oils. Margarine. Mayonnaise. Gravy. Spreads. Sweets and desserts Soft, smooth, moist desserts. Pudding. Custard. Moist cakes. Jam. Jelly. Honey. Preserves. Ask your health care provider whether you can have frozen desserts. Seasonings and other foods All seasonings and sweeteners. All sauces with small chunks. Prepared tuna, egg, or chicken salad without raw fruits or vegetables. Moist casseroles with small, tender pieces of meat. Soups with tender meat. The items listed above may not be a complete list of foods and beverages you can eat. Contact a dietitian for more information. What foods must I avoid? Grains Coarse or dry cereals. Dry breads. Toast. Crackers. Tough, crusty breads, such as [place] bread and baguettes. Dry pancakes, waffles, and muffins. Sticky rice. Dry bread stuffing. Granola. Popcorn. Chips. Fruits Hard, crunchy, stringy, high-pulp, and juicy raw fruits such as apples, pineapple, papaya, and watermelon. Small, round fruits, such as grapes. Dried fruit and fruit leather. Vegetables All raw vegetables. Cooked corn. Rubbery or stiff cooked vegetables. Stringy vegetables, such as celery. Tough, crisp fried potatoes. Potato skins. Meats and other proteins Large pieces of meat. Dry, tough meats, such as bacon, sausage, and hot dogs. Chicken, [place], or fish with skin and bones. Crunchy peanut butter. Nuts. Seeds. Nut and seed butters. Dairy Yogurt with nuts, seeds, or large chunks. Large chunks of cheese. Sweets and desserts Dry cakes. Chewy or dry cookies. Any desserts with nuts, seeds, dry fruits, coconut, pineapple, or anything dry, sticky, or hard. Chewy caramel. Licorice. Taffy-type candies. Ask your health care provider whether you can have frozen desserts. Seasonings and other foods Soups with tough or large chunks of meats, poultry, or vegetables. Corn or clam chowder. Smoothies with large chunks of fruit. The items listed above may not be a complete list of  foods and beverages you should avoid. Contact a dietitian for more information. Summary  Bite size foods can be helpful for people with moderate swallowing problems.  On this dysphagia eating plan, you may eat foods that are soft, moist, and cut into pieces smaller than your thumb nail (about 15 mm by 15 mm).  You may be instructed to thicken liquids. Follow your health care provider's instructions about how to do this and to what consistency. This information is not intended to replace advice given to you by your health care provider. Make sure you discuss any questions you have with your health care provider. Document Revised: 08/25/2020 Document Reviewed: 08/25/2020 Elsevier Patient Education  2021 [employer].    IMPORTANT INFORMATION: PAY CLOSE ATTENTION   PHYSICIAN DISCHARGE INSTRUCTIONS  Follow with Primary care provider  Sherrilee Gilles, DO  and other consultants as instructed by your Hospitalist Physician  [place] IF SYMPTOMS COME BACK, WORSEN OR NEW PROBLEM DEVELOPS   Please note: You were cared for by a hospitalist during your hospital stay. Every effort will be made to forward records to your primary care provider.  You can request that your primary care provider send for your hospital records if they have not received them.  Once you are discharged, your primary care physician will handle any further medical issues. Please note that NO REFILLS for any discharge medications will be authorized once  you are discharged, as it is imperative that you return to your primary care physician (or establish a relationship with a primary care physician if you do not have one) for your post hospital discharge needs so that they can reassess your need for medications and monitor your lab values.  Please get a complete blood count and chemistry panel checked by your Primary MD at your next visit, and again as instructed by your Primary MD.  Get Medicines  reviewed and adjusted: Please take all your medications with you for your next visit with your Primary MD  Laboratory/radiological data: Please request your Primary MD to go over all hospital tests and procedure/radiological results at the follow up, please ask your primary care provider to get all Hospital records sent to his/her office.  In some cases, they will be blood work, cultures and biopsy results pending at the time of your discharge. Please request that your primary care provider follow up on these results.  If you are diabetic, please bring your blood sugar readings with you to your follow up appointment with primary care.    Please call and make your follow up appointments as soon as possible.    Also Note the following: If you experience worsening of your admission symptoms, develop shortness of breath, life threatening emergency, suicidal or homicidal thoughts you must seek medical attention immediately by calling 911 or calling your MD immediately  if symptoms less severe.  You must read complete instructions/literature along with all the possible adverse reactions/side effects for all the Medicines you take and that have been prescribed to you. Take any new Medicines after you have completely understood and accpet all the possible adverse reactions/side effects.   Do not drive when taking Pain medications or sleeping medications (Benzodiazepines)  Do not take more than prescribed Pain, Sleep and Anxiety Medications. It is not advisable to combine anxiety,sleep and pain medications without talking with your primary care practitioner  Special Instructions: If you have smoked or chewed Tobacco  in the last 2 yrs please stop smoking, stop any regular Alcohol  and or any Recreational drug use.  Wear Seat belts while driving.  Do not drive if taking any narcotic, mind altering or controlled substances or recreational drugs or alcohol.

## 2021-04-03 NOTE — Evaluation (Addendum)
Modified Barium Swallow Progress Note  Patient Details  Name: Melinda Acevedo MRN: [id number] Date of Birth: December 04, 1941  Today's Date: 04/03/2021  Modified Barium Swallow completed.  Full report located under Chart Review in the Imaging Section.  Brief recommendations include the following:  Clinical Impression  Pt presents with oral dysphagia seemingly negatively impacted by anxiety and fear of "choking". The pharyngeal stage of the swallow is grossly WFL. Oral stage of swallowing is characterized by lingual pumping, holding of bolus, piecemeal swallowing, decreased bolus cohesion, reduced (fear of) posterior propulsion of bolus and trace amounts of premature spillage to the level of the valleculae. Pt would only take very small amounts of liquid or solids and when posterior propulsion began, specifically with thin liquids, she would demonstrate an aggressive chin tuck & lean forward to ensure liquids did not fall posteriorly. Later Pt reported to SLP "I could feel it going back into my throat". There were trace amounts of premature spillage of thin liquids to the level of the valleculae, however the swallow was triggered shortly after with adequate pharyngeal squeeze and no pharyngeal residue. NO penetration or aspiration was visualized on this study; further note good hyolaryngeal excursion and adequate laryngeal vestibule closure. Note prominent UES. SLP provided education to Pt that no penetration or aspiration was visualized and that the behaviors including aggressive chin tuck, only taking tiny sips, holding bolus for prolonged periods are likely not facilitating safe swallowing. Recommend continue with D3/mech soft diet and thin liquids; continue to crush meds and administer in puree as Pt reports inability to swallow pills at this time (would not attempt barium tablet).  SLP will continue to follow acutely for oral dysphagia, education and support.   Swallow Evaluation Recommendations        SLP Diet Recommendations: Dysphagia 3 (Mech soft) solids;Thin liquid   Liquid Administration via: Cup   Medication Administration: Crushed with puree   Supervision: Patient able to self feed   Compensations: Slow rate;Small sips/bites   Postural Changes: Remain semi-upright after after feeds/meals (Comment)   Oral Care Recommendations: Oral care BID      Leopoldo Mazzie H. Roddie Mc, CCC-SLP Speech Language Pathologist   Wende Bushy 04/03/2021,3:17 PM

## 2021-04-03 NOTE — Plan of Care (Signed)
  Problem: Education: Goal: Knowledge of disease or condition will improve Outcome: Progressing   Problem: Education: Goal: Knowledge of General Education information will improve Description: Including pain rating scale, medication(s)/side effects and non-pharmacologic comfort measures Outcome: Progressing   Problem: Activity: Goal: Risk for activity intolerance will decrease Outcome: Progressing    Problem: Safety: Goal: Ability to remain free from injury will improve Outcome: Progressing

## 2021-04-03 NOTE — Progress Notes (Signed)
*  PRELIMINARY RESULTS* Echocardiogram 2D Echocardiogram has been performed.  [name] 04/03/2021, 4:17 PM

## 2021-04-03 NOTE — Progress Notes (Signed)
SLP Cancellation Note  Patient Details Name: Melinda Acevedo MRN: [id number] DOB: 31-Mar-1941   Cancelled treatment:       Reason Eval/Treat Not Completed: SLP screened, no needs identified, will sign off. SLP provided brief cognitive screen revealing Pt's speech/cognition are at baseline. Further note MRI reveals no presence of acute infarct. There are no further needs for speech/language/cognition. ST will continue to follow for dysphagia.   Thank you, Devontae Casasola H. Roddie Mc, CCC-SLP Speech Language Pathologist    Wende Bushy 04/03/2021, 3:59 PM

## 2021-04-03 NOTE — Progress Notes (Signed)
Pt transported to MRI 

## 2021-04-03 NOTE — Consult Note (Signed)
[facility] TeleNeurology Consult Services  Stat Consult  Date of Service:   04/02/2021 21:54:07  Diagnosis:     .  I63.89 - Cerebrovascular accident (CVA) due to other mechanism ([place])  Impression: Melinda Acevedo is a 80 year old female who is evaluated for her swallowing problems and balance problems. The findings are concerning for a posterior fossa stroke. Her head CT is normal. I reviewed the findings with the ER physician and the plan will be to proceed with a brain MRI and to complete a stroke evaluation if needed.  CT HEAD: Showed No Acute Hemorrhage or Acute Core Infarct  Our recommendations are outlined below.  Diagnostic Studies: Recommend MRI brain without contrast  Laboratory Studies: Recommend Lipid panel Hemoglobin A1c  Nursing Recommendations: Telemetry, IV Fluids, avoid dextrose containing fluids, Maintain euglycemia Neuro checks q4 hrs x 24 hrs and then per shift Head of bed 30 degrees  Consultations: Recommend Speech therapy if failed dysphagia screen Physical therapy/Occupational therapy  Disposition: Neurology will follow   Metrics: [facility] Notification Time: 04/02/2021 21:52:11 Stamp Time: 04/02/2021 21:54:07 Callback Response Time: 04/02/2021 22:00:38   ----------------------------------------------------------------------------------------------------  Chief Complaint: Inability to swallow.  History of Present Illness: Patient is a 80 year old Female.  Melinda Acevedo is a 80 year old female with HTN, HLD, DM and CAD who presented to the ER with a 2 week history of inability to swallow. She chokes and gags and at times it feels her throat is closing off. She also has had some dizziness that she describes as imbalance. She has no problems with speech and no visual changes or ptosis   Past Medical History:     . Hypertension     . Hyperlipidemia     . Coronary Artery Disease     . There is NO history of Diabetes  [place]  Anticoagulant use:  No  Antiplatelet use: Yes ASA   Examination: BP(150/64), Pulse(81), Blood Glucose(140) 1A: Level of Consciousness - Alert; keenly responsive + 0 1B: Ask Month and Age - Both Questions Right + 0 1C: Blink Eyes & Squeeze Hands - Performs Both Tasks + 0 2: Test Horizontal Extraocular Movements - Normal + 0 3: Test Visual Fields - No Visual Loss + 0 4: Test Facial Palsy (Use Grimace if Obtunded) - Normal symmetry + 0 5A: Test Left Arm Motor Drift - No Drift for 10 Seconds + 0 5B: Test Right Arm Motor Drift - No Drift for 10 Seconds + 0 6A: Test Left Leg Motor Drift - No Drift for 5 Seconds + 0 6B: Test Right Leg Motor Drift - No Drift for 5 Seconds + 0 7: Test Limb Ataxia (FNF/Heel-Shin) - No Ataxia + 0 8: Test Sensation - Normal; No sensory loss + 0 9: Test Language/Aphasia - Normal; No aphasia + 0 10: Test Dysarthria - Normal + 0 11: Test Extinction/Inattention - No abnormality + 0  NIHSS Score: 0     Patient / Family was informed the Neurology Consult would occur via TeleHealth consult by way of interactive audio and video telecommunications and consented to receiving care in this manner.  Patient is being evaluated for possible acute neurologic impairment and high probability of imminent or life - threatening deterioration.I spent total of 29 minutes providing care to this patient, including time for face to face visit via telemedicine, review of medical records, imaging studies and discussion of findings with providers, the patient and / or family.   Dr Birdena Jubilee   [facility] [phone]  Case [id number]

## 2021-04-05 LAB — URINE CULTURE: Culture: >=100000 — AB

## 2021-04-06 ENCOUNTER — Other Ambulatory Visit: Payer: Self-pay | Admitting: Family Medicine

## 2021-04-06 ENCOUNTER — Encounter: Payer: Self-pay | Admitting: Family Medicine

## 2021-04-06 MED ORDER — NITROFURANTOIN MONOHYD MACRO 100 MG PO CAPS: 100.0000 mg | ORAL_CAPSULE | Freq: Two times a day (BID) | ORAL | 0 refills | Status: AC | PRN

## 2021-04-07 ENCOUNTER — Ambulatory Visit (INDEPENDENT_AMBULATORY_CARE_PROVIDER_SITE_OTHER): Payer: Medicare Other | Admitting: Internal Medicine

## 2021-04-07 NOTE — Progress Notes (Signed)
04/07/21 called patient with results and Rx nitrofurantoin called in. Pt says she has some at home already.  Pt still having trouble swallowing.  Pt advised to return to hospital for PEG placement if continues to struggle with swallowing and can't get fluids down or take medication or keep down full liquid diet.  Pt verbalized understanding.  Murvin Natal MD How to contact the [facility] Attending or Consulting provider [place] or covering provider during after hours [place], for this patient?  Check the care team in [facility] and look for a) attending/consulting [facility] provider listed and b) the [facility] team listed Log into [email or web address] and use [place] universal password to access. If you do not have the password, please contact the hospital operator. Locate the [facility] provider you are looking for under [facility] and page to a number that you can be directly reached. If you still have difficulty reaching the provider, please page the [facility] (Director on Call) for the Hospitalists listed on amion for assistance.

## 2021-04-08 ENCOUNTER — Telehealth (INDEPENDENT_AMBULATORY_CARE_PROVIDER_SITE_OTHER): Payer: Self-pay | Admitting: Gastroenterology

## 2021-04-08 NOTE — Telephone Encounter (Signed)
Patient left voice mail message stating she went to the hospital over the weekend - when discharged the hospitalitis advised her to call here and have a referral sent to get a peg tubed placed - please advise - ph# [phone]

## 2021-04-08 NOTE — Telephone Encounter (Signed)
Thanks Mitzie. Ann, can you please refer the patient to general surgery for gastrostomy tube placement?  Thanks,  Maylon Peppers, MD Gastroenterology and Hepatology [facility]

## 2021-04-08 NOTE — Telephone Encounter (Signed)
Referral placed for [facility], Patient request

## 2021-04-09 ENCOUNTER — Other Ambulatory Visit: Payer: Self-pay

## 2021-04-09 ENCOUNTER — Ambulatory Visit (INDEPENDENT_AMBULATORY_CARE_PROVIDER_SITE_OTHER): Payer: Medicare Other | Admitting: General Surgery

## 2021-04-09 ENCOUNTER — Encounter: Payer: Self-pay | Admitting: General Surgery

## 2021-04-09 VITALS — BP 135/69 | HR 79 | Temp 98.0°F | Resp 16 | Ht 60.0 in | Wt 151.0 lb

## 2021-04-09 DIAGNOSIS — I6529 Occlusion and stenosis of unspecified carotid artery: Secondary | ICD-10-CM | POA: Diagnosis not present

## 2021-04-09 DIAGNOSIS — R1311 Dysphagia, oral phase: Secondary | ICD-10-CM

## 2021-04-09 NOTE — Patient Instructions (Signed)
PEG Tube Home Guide A percutaneous endoscopic gastrostomy (PEG) tube is used to deliver food, medicine, and fluids directly into the stomach. The tube has a clamp, a cap, and two anchors (bolsters). One bolster keeps the tube from coming out of the stomach. The other bolster holds the tube against the abdomen. You will be taught how to use and adjust your PEG tube before you leave the hospital. You will also be taught how to care for the opening (stoma) in your abdomen. Make sure that you understand:  How to care for your PEG tube.  How to care for your stoma.  How to give yourself feedings and medicines.  When to call your health care provider for help. Supplies needed:  Soapy water.  Clean, plain water.  Clean washcloth.  Bandage (dressing). This is optional.  Syringe. How to care for a PEG tube Check your PEG tube every day. Make sure:  It is not too tight. The bolster should rest gently over the stoma.  It is in the correct position. There is a Mclean Moya on the tube that shows when it is in the correct position. Adjust the tube if you need to. Cleaning your stoma Clean your stoma every day. Follow these steps: 1. Wash your hands with soap and water for at least 20 seconds. If soap and water are not available, use hand sanitizer. 2. Check the skin around the stoma for redness, rash, swelling, drainage, or extra tissue growth. If you notice any of these, call your health care provider. 3. Wash the stoma and the skin around it using a clean, soft washcloth. Clean using a circular motion, and wipe away from the stoma opening, not toward it. ? Use warm, soapy water, and only use cleansers recommended by your health care provider. ? Rinse the stoma area with plain water. ? Pat the stoma area dry. 4. Place a dressing over the stoma if your health care provider told you to do that.   Giving yourself a feeding Your health care provider will give you instructions about:  How much  nutrition and fluid you will need for each feeding.  How often to have a feeding.  Whether to take medicine in the tube by itself or with a feeding. To give yourself a feeding, follow these steps: 1. Lay out all of the equipment that you will need. 2. Make sure that the nutritional formula is at room temperature. 3. Wash your hands with soap and water for at least 20 seconds. If soap and water are not available, use hand sanitizer. 4. Position yourself so that you are upright. You will need to stay upright throughout the feeding and for at least 30 minutes after the feeding. 5. Make sure the syringe plunger is pushed in. Place the tip of the syringe in clean water, and slowly pull the plunger to bring (draw up) the water into the syringe. 6. Remove the clamp and the cap from the PEG tube. 7. Push the water out of the syringe to clean (flush) the tube. 8. If the tube is clear, draw up the formula into the syringe. Make sure to use the right amount for each feeding and add water if necessary. 9. Slowly push the formula from the syringe through the tube. 10. After the feeding, flush the tube with water. 11. Put the clamp and the cap on the tube. 12. Stay sitting up or standing up straight for at least 30 minutes.   Giving yourself medicine To give  yourself medicine, follow these steps: 1. Lay out all of the equipment that you will need. 2. If your medicine is in tablet form, crush the tablet and dissolve it in water. 3. Wash your hands with soap and water for at least 20 seconds. If soap and water are not available, use hand sanitizer. 4. Position yourself so that you are upright. You will need to stay upright while you give yourself medicine and for at least 30 minutes afterward. 5. Make sure the syringe plunger is pushed in. Place the tip of the syringe in clean water, and slowly pull the plunger to bring (draw up) the water into the syringe. 6. Remove the clamp and the cap from the PEG  tube. 7. Push the water out of the syringe to clean (flush) the tube. 8. If the tube is clear, draw up the medicine into the syringe. 9. Slowly push the medicine from the syringe through the tube. 10. Flush the tube with water. 11. Put the clamp and the cap on the tube. 12. Stay sitting up or standing up straight for at least 30 minutes. Do not take sustained release (SR) medicines through your tube. If you are unsure if your medicine is an SR medicine, ask your health care provider or pharmacist.   Contact a health care provider if you have:  Soreness, redness, or irritation around your stoma.  Abdominal pain or bloating during or after your feedings.  Nausea, constipation, or diarrhea that will not go away.  A fever.  Problems with your PEG tube. Get help right away if:  Your tube is blocked.  Your tube falls out.  You have pain around your stoma.  You are bleeding from your stoma.  Your tube is leaking.  You choke or you have trouble breathing during or after a feeding. Summary  A percutaneous endoscopic gastrostomy (PEG) tube is used to deliver food and fluids directly into the stomach.  You will be taught how to use and adjust your PEG tube. You will also be taught how to care for the stoma in your abdomen.  Your health care provider will give you instructions on how to give yourself nutritional formula and medicines through your PEG tube.  Contact your health care provider if you have a fever or soreness, redness, or irritation around your stoma.  Get help right away if your tube leaks, is blocked, or falls out. Get help right away if you have pain or bleeding around your stoma. This information is not intended to replace advice given to you by your health care provider. Make sure you discuss any questions you have with your health care provider. Document Revised: 04/18/2020 Document Reviewed: 04/18/2020 Elsevier Patient Education  [place].

## 2021-04-10 ENCOUNTER — Encounter (HOSPITAL_COMMUNITY): Payer: Self-pay

## 2021-04-10 ENCOUNTER — Other Ambulatory Visit: Payer: Self-pay

## 2021-04-10 ENCOUNTER — Emergency Department (HOSPITAL_COMMUNITY)
Admission: EM | Admit: 2021-04-10 | Discharge: 2021-04-10 | Disposition: A | Discharge status: Home / Self Care | Payer: Medicare Other | Attending: Emergency Medicine | Admitting: Emergency Medicine

## 2021-04-10 DIAGNOSIS — E119 Type 2 diabetes mellitus without complications: Secondary | ICD-10-CM | POA: Insufficient documentation

## 2021-04-10 DIAGNOSIS — Z87891 Personal history of nicotine dependence: Secondary | ICD-10-CM | POA: Diagnosis not present

## 2021-04-10 DIAGNOSIS — Z79899 Other long term (current) drug therapy: Secondary | ICD-10-CM | POA: Insufficient documentation

## 2021-04-10 DIAGNOSIS — Z7982 Long term (current) use of aspirin: Secondary | ICD-10-CM | POA: Insufficient documentation

## 2021-04-10 DIAGNOSIS — I10 Essential (primary) hypertension: Secondary | ICD-10-CM | POA: Diagnosis not present

## 2021-04-10 DIAGNOSIS — R1310 Dysphagia, unspecified: Secondary | ICD-10-CM | POA: Diagnosis present

## 2021-04-10 DIAGNOSIS — E039 Hypothyroidism, unspecified: Secondary | ICD-10-CM | POA: Insufficient documentation

## 2021-04-10 DIAGNOSIS — I2510 Atherosclerotic heart disease of native coronary artery without angina pectoris: Secondary | ICD-10-CM | POA: Diagnosis not present

## 2021-04-10 LAB — BASIC METABOLIC PANEL
Anion gap: 11 (ref 5–15)
BUN: 19 mg/dL (ref 8–23)
CO2: 24 mmol/L (ref 22–32)
Calcium: 8.6 mg/dL — ABNORMAL LOW (ref 8.9–10.3)
Chloride: 107 mmol/L (ref 98–111)
Creatinine, Ser: 0.56 mg/dL (ref 0.44–1.00)
GFR, Estimated: >60 mL/min (ref >60)
Glucose, Bld: 83 mg/dL (ref 70–99)
Potassium: 3.3 mmol/L — ABNORMAL LOW (ref 3.5–5.1)
Sodium: 142 mmol/L (ref 135–145)

## 2021-04-10 LAB — CBC WITH DIFFERENTIAL/PLATELET
Abs Immature Granulocytes: 0.03 10*3/uL (ref 0.00–0.07)
Basophils Absolute: 0.1 10*3/uL (ref 0.0–0.1)
Basophils Relative: 1 %
Eosinophils Absolute: 0.1 10*3/uL (ref 0.0–0.5)
Eosinophils Relative: 1 %
HCT: 41.8 % (ref 36.0–46.0)
Hemoglobin: 13.2 g/dL (ref 12.0–15.0)
Immature Granulocytes: 0 %
Lymphocytes Relative: 25 %
Lymphs Abs: 2.6 10*3/uL (ref 0.7–4.0)
MCH: 30.8 pg (ref 26.0–34.0)
MCHC: 31.6 g/dL (ref 30.0–36.0)
MCV: 97.7 fL (ref 80.0–100.0)
Monocytes Absolute: 0.8 10*3/uL (ref 0.1–1.0)
Monocytes Relative: 8 %
Neutro Abs: 7.1 10*3/uL (ref 1.7–7.7)
Neutrophils Relative %: 65 %
Platelets: 319 10*3/uL (ref 150–400)
RBC: 4.28 MIL/uL (ref 3.87–5.11)
RDW: 14.2 % (ref 11.5–15.5)
WBC: 10.7 10*3/uL — ABNORMAL HIGH (ref 4.0–10.5)
nRBC: 0 % (ref 0.0–0.2)

## 2021-04-10 LAB — URINALYSIS, ROUTINE W REFLEX MICROSCOPIC
Bacteria, UA: NONE SEEN
Bilirubin Urine: NEGATIVE
Glucose, UA: NEGATIVE mg/dL
Hgb urine dipstick: NEGATIVE
Ketones, ur: 80 mg/dL — AB
Nitrite: NEGATIVE
Protein, ur: NEGATIVE mg/dL
Specific Gravity, Urine: 1.02 (ref 1.005–1.030)
pH: 5 (ref 5.0–8.0)

## 2021-04-10 MED ORDER — POTASSIUM CHLORIDE 20 MEQ PO PACK
40.0000 meq | PACK | Freq: Two times a day (BID) | ORAL | Status: DC
  Administered 2021-04-10: 40 meq via ORAL
  Filled 2021-04-10: qty 2

## 2021-04-10 MED ORDER — POTASSIUM CHLORIDE IN NACL 20-0.9 MEQ/L-% IV SOLN
Freq: Once | INTRAVENOUS | Status: AC
  Filled 2021-04-10: qty 1000

## 2021-04-10 MED ORDER — POTASSIUM CHLORIDE CRYS ER 20 MEQ PO TBCR
40.0000 meq | EXTENDED_RELEASE_TABLET | Freq: Once | ORAL | Status: DC
  Filled 2021-04-10: qty 2

## 2021-04-10 MED ORDER — SODIUM CHLORIDE 0.9 % IV BOLUS
1000.0000 mL | Freq: Once | INTRAVENOUS | Status: AC
  Administered 2021-04-10: 1000 mL via INTRAVENOUS

## 2021-04-10 NOTE — Discharge Instructions (Addendum)
Call your primary care doctor or specialist as discussed in the next 2-3 days.   Return immediately back to the ER if:  Your symptoms worsen within the next 12-24 hours. You develop new symptoms such as new fevers, persistent vomiting, new pain, shortness of breath, or new weakness or numbness, or if you have any other concerns.  

## 2021-04-10 NOTE — ED Notes (Signed)
Pt having difficulty tolerating PO intake of potassium, Dr. Oris Drone made aware.

## 2021-04-10 NOTE — ED Notes (Signed)
Pt placed on cardiac monitoring, medicated per MAR, pt sitting on side of bed, bed locked and low, call bell within reach. Will continue to monitor.

## 2021-04-10 NOTE — ED Provider Notes (Signed)
[facility] EMERGENCY DEPARTMENT Provider Note   CSN: [id number] Arrival date & time: 04/10/21  1408     History Chief Complaint  Patient presents with  . Weakness    Melinda Acevedo is a 80 y.o. female.  Patient presents to ER chief complaint of difficulty swallowing.  Symptoms been ongoing for over a week now.  She was recently admitted about a week ago for similar symptoms had a work-up that included MRI of the brain and endoscopy which was unremarkable per patient.  She states that she is able to swallow subconsciously such as clearing her saliva or if she has an ice cube melt in her mouth she is able to swallow liquid.  However when she takes a cup and tries to drink water, she feels like she is unable to initiate the swallowing motion.  She otherwise denies any fevers or cough.  No vomiting or diarrhea.  No headache no neck pain no chest pain or abdominal pain.  She states that she feels dehydrated and is asking for IV fluids.        Past Medical History:  Diagnosis Date  . Anxiety   . CAD (coronary artery disease)   . Carotid artery occlusion   . Diabetes mellitus without complication ([place])   . Glaucoma   . History of hiatal hernia   . Hyperlipidemia   . Hypertension   . Irregular heart beats   . Seasonal allergies   . Sleep apnea     Patient Active Problem List   Diagnosis Date Noted  . Dysphagia 04/03/2021  . Anxiety 01/06/2021  . Constipation, unspecified 01/06/2021  . Diverticulitis 01/06/2021  . Fatigue 01/06/2021  . GERD (gastroesophageal reflux disease) 01/06/2021  . Hyperlipidemia 01/06/2021  . Hypertension 01/06/2021  . Hypothyroidism 01/06/2021  . Impaired fasting glucose 01/06/2021  . Osteoporosis 01/06/2021  . Vitamin D deficiency 01/06/2021  . Neck pain 01/01/2021  . History of colonic polyps 01/01/2021  . Obstructive sleep apnea 08/26/2017    Past Surgical History:  Procedure Laterality Date  . ABDOMINAL HYSTERECTOMY    . CATARACT  EXTRACTION Bilateral   . CHOLECYSTECTOMY    . COLONOSCOPY  2017   Dr.Williams in [place] , [place]  . COLONOSCOPY WITH PROPOFOL N/A 02/06/2021   Procedure: COLONOSCOPY WITH PROPOFOL;  Surgeon: Harvel Quale, MD;  Location: [facility] SU[facility];  Service: Gastroenterology;  Laterality: N/A;  10:30  . ESOPHAGOGASTRODUODENOSCOPY (EGD) WITH PROPOFOL N/A 02/06/2021   Procedure: ESOPHAGOGASTRODUODENOSCOPY (EGD) WITH PROPOFOL;  Surgeon: Harvel Quale, MD;  Location: [facility];  Service: Gastroenterology;  Laterality: N/A;  . heart catherization  [id number], 2010  . RECTOCELE REPAIR    . TONSILLECTOMY    . TUBAL LIGATION  1986  . UPPER GASTROINTESTINAL ENDOSCOPY  2015   Dr.Williams in [place], [place]  . VAGINAL HYSTERECTOMY  1991     OB History   No obstetric history on file.     Family History  Problem Relation Age of Onset  . COPD Mother   . Heart disease Mother   . Heart disease Father   . Heart attack Father   . Hypertension Sister   . Mitral valve prolapse Brother     Social History   Tobacco Use  . Smoking status: Former Smoker    Years: 2.00    Types: Cigarettes    Quit date: 1964    Years since quitting: 58.3  . Smokeless tobacco: Never Used  Vaping Use  . Vaping Use: Never used  Substance Use Topics  . Alcohol use: No  . Drug use: No    Home Medications Prior to Admission medications   Medication Sig Start Date End Date Taking? Authorizing Provider  amLODipine (NORVASC) 5 MG tablet Take 5 mg by mouth daily.    [provider]  aspirin 81 MG tablet Take 81 mg by mouth daily.    [provider]  benazepril (LOTENSIN) 20 MG tablet Take 0.5 tablets (10 mg total) by mouth 2 (two) times daily. 04/03/21   Johnson, Clanford L, MD  BLACK ELDERBERRY PO Take 1 each by mouth daily.    [provider]  Cholecalciferol (VITAMIN D) 125 MCG (5000 UT) CAPS Take 5,000 Units by mouth daily.     [provider]  Coenzyme Q10 (COQ10)  100 MG CAPS Take 200 mg by mouth daily.    [provider]  loratadine (CLARITIN) 10 MG tablet Take 10 mg by mouth daily.    [provider]  Multiple Vitamins-Minerals (MULTIVITAMIN ADULT PO) Take 1 tablet by mouth daily.    [provider]  nitrofurantoin, macrocrystal-monohydrate, (MACROBID) 100 MG capsule Take 1 capsule (100 mg total) by mouth 2 (two) times daily as needed for up to 5 days (UTI). 04/06/21 04/11/21  Johnson, Clanford L, MD  nitroGLYCERIN (NITROSTAT) 0.4 MG SL tablet Place 0.4 mg under the tongue every 5 (five) minutes as needed for chest pain.    [provider]  ondansetron (ZOFRAN) 4 MG tablet Take 4 mg by mouth every 8 (eight) hours as needed for vomiting or nausea. 01/19/21   [provider]  RABEprazole (ACIPHEX) 20 MG tablet Take 20 mg by mouth daily.    [provider]  rosuvastatin (CRESTOR) 5 MG tablet Take 5 mg by mouth daily.    [provider]  Travoprost, BAK Free, (TRAVATAN) 0.004 % SOLN ophthalmic solution Place 1 drop into both eyes at bedtime.    [provider]    Allergies    Contrast media [iodinated diagnostic agents], Alendronate, Cinoxacin, Diltiazem, Keflex [cephalexin], Naproxen, Nifedipine, Norfloxacin, Nsaids, Other, Pravastatin sodium, Trimethoprim, and Valdecoxib  Review of Systems   Review of Systems  Constitutional: Negative for fever.  HENT: Negative for ear pain.   Eyes: Negative for pain.  Respiratory: Negative for cough.   Cardiovascular: Negative for chest pain.  Gastrointestinal: Negative for abdominal pain.  Genitourinary: Negative for flank pain.  Musculoskeletal: Negative for back pain.  Skin: Negative for rash.  Neurological: Negative for headaches.    Physical Exam Updated Vital Signs BP (!) 141/57 (BP Location: Right Arm)   Pulse 76   Temp (!) 97.5 F (36.4 C) (Oral)   Resp 15   Ht 5' (1.524 m)   Wt 68.5 kg   SpO2 95%   BMI 29.49 kg/m   Physical  Exam Constitutional:      General: She is not in acute distress.    Appearance: Normal appearance.  HENT:     Head: Normocephalic.     Nose: Nose normal.     Mouth/Throat:     Mouth: Mucous membranes are moist.  Eyes:     Extraocular Movements: Extraocular movements intact.  Cardiovascular:     Rate and Rhythm: Normal rate.  Pulmonary:     Effort: Pulmonary effort is normal.  Musculoskeletal:        General: Normal range of motion.     Cervical back: Normal range of motion.  Neurological:     General: No focal  deficit present.     Mental Status: She is alert. Mental status is at baseline.     Comments: Cranial nerves II to XII intact.  Strength 5/5 all extremities.  Muscles of the face and the muscles of the mouth all appear intact bilaterally.     ED Results / Procedures / Treatments   Labs (all labs ordered are listed, but only abnormal results are displayed) Labs Reviewed  CBC WITH DIFFERENTIAL/PLATELET - Abnormal; Notable for the following components:      Result Value   WBC 10.7 (*)    All other components within normal limits  URINALYSIS, ROUTINE W REFLEX MICROSCOPIC - Abnormal; Notable for the following components:   APPearance HAZY (*)    Ketones, ur 80 (*)    Leukocytes,Ua TRACE (*)    All other components within normal limits  BASIC METABOLIC PANEL - Abnormal; Notable for the following components:   Potassium 3.3 (*)    Calcium 8.6 (*)    All other components within normal limits  URINE CULTURE    EKG None  Radiology No results found.  Procedures Procedures   Medications Ordered in ED Medications  potassium chloride (KLOR-CON) packet 40 mEq (40 mEq Oral Given 04/10/21 2038)  sodium chloride 0.9 % bolus 1,000 mL (0 mLs Intravenous Stopped 04/10/21 1900)  0.9 % NaCl with KCl 20 mEq/ L  infusion ( Intravenous New Bag/Given 04/10/21 2037)    ED Course  I have reviewed the triage vital signs and the nursing notes.  Pertinent labs & imaging results  that were available during my care of the patient were reviewed by me and considered in my medical decision making (see chart for details).    MDM Rules/Calculators/A&P                          Patient appears in no acute distress normal vital signs and normal neuro exam.  Labs are sent and IV fluids ordered.  Potassium mildly low and given repletion here in the ER.  Patient feeling much better after IV fluid hydration.  Discharged home in stable condition.  Advised follow-up with her doctors in 2 or 3 days.  Advised immediate return for worsening symptoms or any additional concerns.   Final Clinical Impression(s) / ED Diagnoses Final diagnoses:  Dysphagia, unspecified type    Rx / DC Orders ED Discharge Orders    None       Luna Fuse, MD 04/10/21 2138

## 2021-04-10 NOTE — ED Triage Notes (Signed)
Pt presents to ED with complaints of generalized weakness since 04/02/21. Pt states she has had difficulty swallowing since then and unable to eat or drink a lot since that date.

## 2021-04-10 NOTE — Progress Notes (Signed)
[name]; [id number]; [birth year]   HPI Patient is a 80 year old white female who presented today for consideration of a PEG.  Patient was diagnosed recently with oral phase dysphagia.  She has had a work-up by neurology which for the most part has been unremarkable.  It was recommended to her to adjust her diet and how she masticates.  She is also gone to [facility] for evaluation but was sent home with follow-up with her primary care and neurologist.  I do not have those records.  Patient is scared that she will become dehydrated and malnourished.  Patient states she has lost 8 pounds over the past week.  She tries to eat ice chips.  She denies any nausea or vomiting. Past Medical History:  Diagnosis Date  . Anxiety   . CAD (coronary artery disease)   . Carotid artery occlusion   . Diabetes mellitus without complication ([place])   . Glaucoma   . History of hiatal hernia   . Hyperlipidemia   . Hypertension   . Irregular heart beats   . Seasonal allergies   . Sleep apnea     Past Surgical History:  Procedure Laterality Date  . ABDOMINAL HYSTERECTOMY    . CATARACT EXTRACTION Bilateral   . CHOLECYSTECTOMY    . COLONOSCOPY  2017   Dr.Williams in [place] , [place]  . COLONOSCOPY WITH PROPOFOL N/A 02/06/2021   Procedure: COLONOSCOPY WITH PROPOFOL;  Surgeon: Harvel Quale, MD;  [facility];  Service: Gastroenterology;  Laterality: N/A;  10:30  . ESOPHAGOGASTRODUODENOSCOPY (EGD) WITH PROPOFOL N/A 02/06/2021   Procedure: ESOPHAGOGASTRODUODENOSCOPY (EGD) WITH PROPOFOL;  Surgeon: Harvel Quale, MD;  [facility];  Service: Gastroenterology;  Laterality: N/A;  . heart catherization  [id number], 2010  . RECTOCELE REPAIR    . TONSILLECTOMY    . TUBAL LIGATION  1986  . UPPER GASTROINTESTINAL ENDOSCOPY  2015   Dr.Williams in [place], [place]  . VAGINAL HYSTERECTOMY  1991    Family History  Problem Relation Age of Onset  . COPD Mother   . Heart  disease Mother   . Heart disease Father   . Heart attack Father   . Hypertension Sister   . Mitral valve prolapse Brother     Current Outpatient Medications on File Prior to Visit  Medication Sig Dispense Refill  . amLODipine (NORVASC) 5 MG tablet Take 5 mg by mouth daily.    Marland Kitchen aspirin 81 MG tablet Take 81 mg by mouth daily.    . benazepril (LOTENSIN) 20 MG tablet Take 0.5 tablets (10 mg total) by mouth 2 (two) times daily.    Marland Kitchen BLACK ELDERBERRY PO Take 1 each by mouth daily.    . Cholecalciferol (VITAMIN D) 125 MCG (5000 UT) CAPS Take 5,000 Units by mouth daily.     . Coenzyme Q10 (COQ10) 100 MG CAPS Take 200 mg by mouth daily.    Marland Kitchen loratadine (CLARITIN) 10 MG tablet Take 10 mg by mouth daily.    . Multiple Vitamins-Minerals (MULTIVITAMIN ADULT PO) Take 1 tablet by mouth daily.    . nitrofurantoin, macrocrystal-monohydrate, (MACROBID) 100 MG capsule Take 1 capsule (100 mg total) by mouth 2 (two) times daily as needed for up to 5 days (UTI). 10 capsule 0  . nitroGLYCERIN (NITROSTAT) 0.4 MG SL tablet Place 0.4 mg under the tongue every 5 (five) minutes as needed for chest pain.    Marland Kitchen ondansetron (ZOFRAN) 4 MG tablet Take 4 mg by mouth every 8 (  eight) hours as needed for vomiting or nausea.    . RABEprazole (ACIPHEX) 20 MG tablet Take 20 mg by mouth daily.    . rosuvastatin (CRESTOR) 5 MG tablet Take 5 mg by mouth daily.    . Travoprost, BAK Free, (TRAVATAN) 0.004 % SOLN ophthalmic solution Place 1 drop into both eyes at bedtime.     No current facility-administered medications on file prior to visit.    Allergies  Allergen Reactions  . Contrast Media [Iodinated Diagnostic Agents] Shortness Of Breath  . Alendronate   . Cinoxacin Hives  . Diltiazem   . Keflex [Cephalexin] Hives  . Naproxen Other (See Comments)    Stomach upset  . Nifedipine   . Norfloxacin   . Nsaids   . Other   . Pravastatin Sodium   . Trimethoprim   . Valdecoxib     Social History   Substance and Sexual  Activity  Alcohol Use No    Social History   Tobacco Use  Smoking Status Former Smoker  . Years: 2.00  . Types: Cigarettes  . Quit date: 1964  . Years since quitting: 58.3  Smokeless Tobacco Never Used    Review of Systems  Constitutional: Positive for malaise/fatigue.  HENT: Positive for sinus pain.   Eyes: Negative.   Respiratory: Negative.   Cardiovascular: Negative.   Gastrointestinal: Positive for heartburn.  Genitourinary: Positive for frequency and urgency.  Musculoskeletal: Positive for back pain.  Skin: Negative.   Neurological: Negative.   Endo/Heme/Allergies: Negative.   Psychiatric/Behavioral: The patient is nervous/anxious.     Objective   Vitals:   04/09/21 1124  BP: 135/69  Pulse: 79  Resp: 16  Temp: 98 F (36.7 C)  SpO2: 94%    Physical Exam Vitals reviewed.  Constitutional:      Appearance: Normal appearance. She is not ill-appearing.  HENT:     Head: Normocephalic and atraumatic.  Cardiovascular:     Rate and Rhythm: Normal rate and regular rhythm.     Heart sounds: Normal heart sounds. No murmur heard. No friction rub. No gallop.   Pulmonary:     Effort: Pulmonary effort is normal. No respiratory distress.     Breath sounds: Normal breath sounds. No stridor. No wheezing, rhonchi or rales.  Abdominal:     General: Bowel sounds are normal. There is no distension.     Palpations: Abdomen is soft. There is no mass.     Tenderness: There is no abdominal tenderness. There is no guarding or rebound.     Hernia: No hernia is present.  Skin:    General: Skin is warm and dry.  Neurological:     Mental Status: She is alert and oriented to person, place, and time.   Previous admission notes reviewed.  Swallowing study report reviewed.  Assessment  Dysphagia of oral phase, unknown etiology  Plan   I told the patient that I cannot place a gastrostomy tube until we have that recommendation from nutrition and/or speech pathology.  She is  extremely anxious about the fact that she may become dehydrated and malnourished.  I will attempt to get her into see a dietitian or occupational therapist for further evaluation and treatment.

## 2021-04-10 NOTE — ED Notes (Signed)
Got pt a new pillow

## 2021-04-12 LAB — URINE CULTURE

## 2021-04-14 ENCOUNTER — Encounter: Payer: Self-pay | Admitting: Nutrition

## 2021-04-14 ENCOUNTER — Encounter: Payer: Medicare Other | Attending: General Surgery | Admitting: Nutrition

## 2021-04-14 ENCOUNTER — Telehealth: Payer: Self-pay | Admitting: Nutrition

## 2021-04-14 ENCOUNTER — Other Ambulatory Visit: Payer: Self-pay

## 2021-04-14 VITALS — Ht 60.0 in | Wt 151.0 lb

## 2021-04-14 DIAGNOSIS — I10 Essential (primary) hypertension: Secondary | ICD-10-CM | POA: Insufficient documentation

## 2021-04-14 DIAGNOSIS — R1310 Dysphagia, unspecified: Secondary | ICD-10-CM

## 2021-04-14 DIAGNOSIS — F419 Anxiety disorder, unspecified: Secondary | ICD-10-CM

## 2021-04-14 DIAGNOSIS — E782 Mixed hyperlipidemia: Secondary | ICD-10-CM | POA: Insufficient documentation

## 2021-04-14 DIAGNOSIS — R634 Abnormal weight loss: Secondary | ICD-10-CM

## 2021-04-14 NOTE — Patient Instructions (Signed)
Goals  Get some Pedialyte and try to take sips or spoonfuls every 15 minutes. Review handout on pureed and mashed foods for intake Add liquids, broth or gravies to food to help moisten for better intake Talk to PCP about referral to counseling for anxiety with history of choking and aspiration in the past Follow up with referral to speech therapist. Dr. Arnoldo Morale office will call you this afternoon to discuss feeding tube placement. Try Boost, Ensure or Glucerna 2-3 cans per day if tolerated to by mouth for nutritional needs.

## 2021-04-14 NOTE — Telephone Encounter (Signed)
TC to patient to follow up on our conversation and suggestions from visit today. She just got home and picked up some Pedialyte freeze pops and regular Pedialyte and some glucerna. She was able to eat 4-5 bites of chicken this afternoon but it takes her 10 minutes to chew 1 piece of meat before she feels like she can initiate a swallow. Gets very fatigue trying to chew foods before she can initiate a swallow. Has some glucerna and will try to take sips of that to get in some calories, protein and vitamins. Advised to consume as much of the glucerna as she can tolerate- a few bottles per day if possible. She verbalized understanding. She notes she took 1/2 pill of her ativan this afternoon and that has helped her anxiety over swallowing a little. Still feels weak and fatigued.

## 2021-04-14 NOTE — H&P (Signed)
Melinda Acevedo; [id number]; May 26, 1941   HPI Patient is a 80 year old white female who presented today for consideration of a PEG.  Patient was diagnosed recently with oral phase dysphagia.  She has had a work-up by neurology which for the most part has been unremarkable.  It was recommended to her to adjust her diet and how she masticates.  She is also gone to [facility] for evaluation but was sent home with follow-up with her primary care and neurologist.  I do not have those records.  Patient is scared that she will become dehydrated and malnourished.  Patient states she has lost 8 pounds over the past week.  She tries to eat ice chips.  She denies any nausea or vomiting. Past Medical History:  Diagnosis Date  . Anxiety   . CAD (coronary artery disease)   . Carotid artery occlusion   . Diabetes mellitus without complication ([place])   . Glaucoma   . History of hiatal hernia   . Hyperlipidemia   . Hypertension   . Irregular heart beats   . Seasonal allergies   . Sleep apnea     Past Surgical History:  Procedure Laterality Date  . ABDOMINAL HYSTERECTOMY    . CATARACT EXTRACTION Bilateral   . CHOLECYSTECTOMY    . COLONOSCOPY  2017   Dr.Williams in [place] , [place]  . COLONOSCOPY WITH PROPOFOL N/A 02/06/2021   Procedure: COLONOSCOPY WITH PROPOFOL;  Surgeon: Harvel Quale, MD;  [facility];  Service: Gastroenterology;  Laterality: N/A;  10:30  . ESOPHAGOGASTRODUODENOSCOPY (EGD) WITH PROPOFOL N/A 02/06/2021   Procedure: ESOPHAGOGASTRODUODENOSCOPY (EGD) WITH PROPOFOL;  Surgeon: Harvel Quale, MD;  [facility];  Service: Gastroenterology;  Laterality: N/A;  . heart catherization  [id number], 2010  . RECTOCELE REPAIR    . TONSILLECTOMY    . TUBAL LIGATION  1986  . UPPER GASTROINTESTINAL ENDOSCOPY  2015   Dr.Williams in [place], [place]  . VAGINAL HYSTERECTOMY  1991    Family History  Problem Relation Age of Onset  . COPD Mother   . Heart  disease Mother   . Heart disease Father   . Heart attack Father   . Hypertension Sister   . Mitral valve prolapse Brother     Current Outpatient Medications on File Prior to Visit  Medication Sig Dispense Refill  . amLODipine (NORVASC) 5 MG tablet Take 5 mg by mouth daily.    Marland Kitchen aspirin 81 MG tablet Take 81 mg by mouth daily.    . benazepril (LOTENSIN) 20 MG tablet Take 0.5 tablets (10 mg total) by mouth 2 (two) times daily.    Marland Kitchen BLACK ELDERBERRY PO Take 1 each by mouth daily.    . Cholecalciferol (VITAMIN D) 125 MCG (5000 UT) CAPS Take 5,000 Units by mouth daily.     . Coenzyme Q10 (COQ10) 100 MG CAPS Take 200 mg by mouth daily.    Marland Kitchen loratadine (CLARITIN) 10 MG tablet Take 10 mg by mouth daily.    . Multiple Vitamins-Minerals (MULTIVITAMIN ADULT PO) Take 1 tablet by mouth daily.    . nitrofurantoin, macrocrystal-monohydrate, (MACROBID) 100 MG capsule Take 1 capsule (100 mg total) by mouth 2 (two) times daily as needed for up to 5 days (UTI). 10 capsule 0  . nitroGLYCERIN (NITROSTAT) 0.4 MG SL tablet Place 0.4 mg under the tongue every 5 (five) minutes as needed for chest pain.    Marland Kitchen ondansetron (ZOFRAN) 4 MG tablet Take 4 mg by mouth every 8 (  eight) hours as needed for vomiting or nausea.    . RABEprazole (ACIPHEX) 20 MG tablet Take 20 mg by mouth daily.    . rosuvastatin (CRESTOR) 5 MG tablet Take 5 mg by mouth daily.    . Travoprost, BAK Free, (TRAVATAN) 0.004 % SOLN ophthalmic solution Place 1 drop into both eyes at bedtime.     No current facility-administered medications on file prior to visit.    Allergies  Allergen Reactions  . Contrast Media [Iodinated Diagnostic Agents] Shortness Of Breath  . Alendronate   . Cinoxacin Hives  . Diltiazem   . Keflex [Cephalexin] Hives  . Naproxen Other (See Comments)    Stomach upset  . Nifedipine   . Norfloxacin   . Nsaids   . Other   . Pravastatin Sodium   . Trimethoprim   . Valdecoxib     Social History   Substance and Sexual  Activity  Alcohol Use No    Social History   Tobacco Use  Smoking Status Former Smoker  . Years: 2.00  . Types: Cigarettes  . Quit date: 1964  . Years since quitting: 58.3  Smokeless Tobacco Never Used    Review of Systems  Constitutional: Positive for malaise/fatigue.  HENT: Positive for sinus pain.   Eyes: Negative.   Respiratory: Negative.   Cardiovascular: Negative.   Gastrointestinal: Positive for heartburn.  Genitourinary: Positive for frequency and urgency.  Musculoskeletal: Positive for back pain.  Skin: Negative.   Neurological: Negative.   Endo/Heme/Allergies: Negative.   Psychiatric/Behavioral: The patient is nervous/anxious.     Objective   Vitals:   04/09/21 1124  BP: 135/69  Pulse: 79  Resp: 16  Temp: 98 F (36.7 C)  SpO2: 94%    Physical Exam Vitals reviewed.  Constitutional:      Appearance: Normal appearance. She is not ill-appearing.  HENT:     Head: Normocephalic and atraumatic.  Cardiovascular:     Rate and Rhythm: Normal rate and regular rhythm.     Heart sounds: Normal heart sounds. No murmur heard. No friction rub. No gallop.   Pulmonary:     Effort: Pulmonary effort is normal. No respiratory distress.     Breath sounds: Normal breath sounds. No stridor. No wheezing, rhonchi or rales.  Abdominal:     General: Bowel sounds are normal. There is no distension.     Palpations: Abdomen is soft. There is no mass.     Tenderness: There is no abdominal tenderness. There is no guarding or rebound.     Hernia: No hernia is present.  Skin:    General: Skin is warm and dry.  Neurological:     Mental Status: She is alert and oriented to person, place, and time.   Previous admission notes reviewed.  Swallowing study report reviewed.  Assessment  Dysphagia of oral phase, unknown etiology  Plan   I told the patient that I cannot place a gastrostomy tube until we have that recommendation from nutrition and/or speech pathology.  She is  extremely anxious about the fact that she may become dehydrated and malnourished.  I will attempt to get her into see a dietitian or occupational therapist for further evaluation and treatment. Addendum: Patient has been seen by dietary who recommends PEG placement.  Patient is scheduled for PEG on 04/17/2021.  The risks and benefits of the procedure were fully explained to the patient, who gave informed consent.

## 2021-04-14 NOTE — Progress Notes (Signed)
Medical Nutrition Therapy  Appointment Start time:  0830 Appointment End time:  1000 Primary concerns today: Dysphagia Referral diagnosis: R13.10 Preferred learning style:  no preference indicated Learning readiness: change in progress   NUTRITION ASSESSMENT  She is here with her husband. She says she feels very weak and can barely walk. She is walking with a cane and doesn't have good balance due to being so weak. She has a cup of ice chips that she sucks on. That is the only thing she can swallow she reports at this time. Hasn't eaten any solid food much due to swallowing issues and only eating ice chips. Sometimes can swallow and sometimes can't. She notes her mouth is very dry.  She has a history of dysphagia in the past. The swallowing issues have escalated in the last 2-3 weeks. She has been evaluated by Neurology at [facility] and in [place]. Been evaluated at [facility] twice in ER with dysphagia. No physical findings reported to support the dysphagia. Was evaluated by Speech Therapy and was recommended a NDD3 diet. However, she feels like she can't swallow real foods. She notes she can't make herself voluntary swallow foods.   She does admit to having a lot of anxiety related to her past of aspiration of coke and some other food items in years past. Would like to work with a therapist to help her with this anxiety. She takes 1/2 ativan most days in am to help her with anxiety.  Anthropometrics  .  Ht: 60" Wt today 151 lbs. Previous wt: 164 lbs in February 2022. Wt loss of 13 lbs;  9% weight loss in a month. IBW 45.4 kg   151% iBW ABW 68.6 kg BMI 29.7  TEE: 1715 Kcal/d (25 kcal/kg) Pro: 55 g Protein (.8 g/kg) CHO: 126 g Fat 72 g Fluids: 1700-2000 mls per day.(25/30 mls/kg)  PEG feeding recommendations to meet 75% of her meals and to try an see if she can eat 25% of her nutritional needs with soft or pureed foods.  Bolus feedings: Give 100 mls of Glucerna every 4 hours via syringe for  the first 24 hrs Give 50 mls of water before and after each feeding for first 24 hours  After tolerating previous feedings, increase feedings of Glucerna  to 215 mls 4 times per day. Flush with 50 mls of water before and after each feeding. May sip on water or beverages between feedings for additional fluids as needed. May try to eat solids of pureed or mashed foods as tolerated between meals. Will have to adjust Tube feedings based on po intake and dysphagia.  Goal of 215 mls 6 times per day with water flush of 100 mls 6 times per day  Will provide 75% of her needs: 1290 kcal 1032 mls of free water + 600 mls from water flush = 1632 cc/d Pro: 52 g  CHO  123 g Fat 69 g If she can not eat 25% of her needs, will need to increase feedings to meet more of her nutritional needs.  [facility] Hx: Dysphagia Medications: see chart Labs: A1C 6.5%.  CMP Latest Ref Rng & Units 04/10/2021 04/03/2021 04/02/2021  Glucose 70 - 99 mg/dL 83 121(H) 140(H)  BUN 8 - 23 mg/dL 19 17 18   Creatinine 0.44 - 1.00 mg/dL 0.56 0.64 0.70  Sodium 135 - 145 mmol/L 142 142 141  Potassium 3.5 - 5.1 mmol/L 3.3(L) 3.4(L) 3.6  Chloride 98 - 111 mmol/L 107 102 102  CO2 22 -  32 mmol/L 24 27 26   Calcium 8.9 - 10.3 mg/dL 8.6(L) 9.6 9.8  Total Protein 6.5 - 8.1 g/dL - 8.1 8.6(H)  Total Bilirubin 0.3 - 1.2 mg/dL - 0.6 0.5  Alkaline Phos 38 - 126 U/L - 61 65  AST 15 - 41 U/L - 25 25  ALT 0 - 44 U/L - 20 23   . Notable Signs/Symptoms: weak, dysphagia, fear of getting strangled and aspirating.   Lifestyle & Dietary Hx Lives with her husband, Has issues with dysphagia.  Estimated daily fluid intake: 12 oz Supplements: none Sleep: varies Stress / self-care: stressed over swallowing Current average weekly physical activity: ADL  24-Hr Dietary Recall Ice chips.  Estimated Energy Needs Calories: 1715 kcal Carbohydrate: 170g Protein: 55 gg Fat: 57g   NUTRITION DIAGNOSIS  [place]-1.1 Swallowing difficulty As  related to dypshagia.  As evidenced by unable to swallow solid foods. Can't voluntariily swallow foods or liquids.. History of aspiration on sodas and foods in the past.    NUTRITION INTERVENTION  Nutrition education (E-1) on the following topics:  . Nutritional needs, dyspahgia, swallowing issues, anxiety issues related to current issues and benefits of counseling for anxiety.  Handouts Provided Include   Pureed and minced foods to try tolerate.    Learning Style & Readiness for Change Teaching method utilized: Visual & Auditory  Demonstrated degree of understanding via: Teach Back  Barriers to learning/adherence to lifestyle change: none Goals Established by Pt Goals  Get some Pedialyte and try to take sips or spoonfuls every 15 minutes. Review handout on pureed and mashed foods for intake Add liquids, broth or gravies to food to help moisten for better intake Talk to PCP about referral to counseling for anxiety with history of choking and aspiration in the past Follow up with referral to speech therapist. Dr. Arnoldo Morale office will call you this afternoon to discuss feeding tube placement. Try Boost, Ensure or Glucerna 2-3 cans per day if tolerated to by mouth for nutritional needs.    MONITORING & EVALUATION Dietary intake, weekly physical activity, and PEG Feedigns in 2 weeks.Marland Kitchen  Next Steps  Patient is to try to eat small amounts of moistened foods throughout the day. Drink pedialyte or oral supplements until feeding tube placed..  Make appointment to therapist and speech therapist for reevaluation.

## 2021-04-15 ENCOUNTER — Encounter (HOSPITAL_COMMUNITY)
Admission: RE | Admit: 2021-04-15 | Discharge: 2021-04-15 | Disposition: A | Discharge status: Home / Self Care | Payer: Medicare Other | Source: Ambulatory Visit | Attending: General Surgery | Admitting: General Surgery

## 2021-04-16 ENCOUNTER — Other Ambulatory Visit (HOSPITAL_COMMUNITY)
Admission: RE | Admit: 2021-04-16 | Discharge: 2021-04-16 | Disposition: A | Discharge status: Home / Self Care | Payer: Medicare Other | Source: Ambulatory Visit | Attending: General Surgery | Admitting: General Surgery

## 2021-04-16 ENCOUNTER — Other Ambulatory Visit: Payer: Self-pay

## 2021-04-16 DIAGNOSIS — Z01812 Encounter for preprocedural laboratory examination: Secondary | ICD-10-CM | POA: Diagnosis present

## 2021-04-16 DIAGNOSIS — Z20822 Contact with and (suspected) exposure to covid-19: Secondary | ICD-10-CM | POA: Insufficient documentation

## 2021-04-16 LAB — SARS CORONAVIRUS 2 (TAT 6-24 HRS): SARS Coronavirus 2: NEGATIVE

## 2021-04-17 ENCOUNTER — Ambulatory Visit (HOSPITAL_COMMUNITY)
Admission: RE | Admit: 2021-04-17 | Discharge: 2021-04-17 | Disposition: A | Discharge status: Home / Self Care | Payer: Medicare Other | Attending: General Surgery | Admitting: General Surgery

## 2021-04-17 ENCOUNTER — Ambulatory Visit (HOSPITAL_COMMUNITY): Payer: Medicare Other | Admitting: Anesthesiology

## 2021-04-17 ENCOUNTER — Encounter (HOSPITAL_COMMUNITY)
Admission: RE | Disposition: A | Discharge status: Home / Self Care | Payer: Self-pay | Source: Home / Self Care | Attending: General Surgery

## 2021-04-17 DIAGNOSIS — Z886 Allergy status to analgesic agent status: Secondary | ICD-10-CM | POA: Insufficient documentation

## 2021-04-17 DIAGNOSIS — Z87891 Personal history of nicotine dependence: Secondary | ICD-10-CM | POA: Diagnosis not present

## 2021-04-17 DIAGNOSIS — Z79899 Other long term (current) drug therapy: Secondary | ICD-10-CM | POA: Diagnosis not present

## 2021-04-17 DIAGNOSIS — E1139 Type 2 diabetes mellitus with other diabetic ophthalmic complication: Secondary | ICD-10-CM | POA: Diagnosis not present

## 2021-04-17 DIAGNOSIS — R1310 Dysphagia, unspecified: Secondary | ICD-10-CM | POA: Insufficient documentation

## 2021-04-17 DIAGNOSIS — Z881 Allergy status to other antibiotic agents status: Secondary | ICD-10-CM | POA: Diagnosis not present

## 2021-04-17 DIAGNOSIS — Z825 Family history of asthma and other chronic lower respiratory diseases: Secondary | ICD-10-CM | POA: Insufficient documentation

## 2021-04-17 DIAGNOSIS — Z91041 Radiographic dye allergy status: Secondary | ICD-10-CM | POA: Diagnosis not present

## 2021-04-17 DIAGNOSIS — Z7982 Long term (current) use of aspirin: Secondary | ICD-10-CM | POA: Insufficient documentation

## 2021-04-17 DIAGNOSIS — Z888 Allergy status to other drugs, medicaments and biological substances status: Secondary | ICD-10-CM | POA: Insufficient documentation

## 2021-04-17 DIAGNOSIS — H42 Glaucoma in diseases classified elsewhere: Secondary | ICD-10-CM | POA: Insufficient documentation

## 2021-04-17 DIAGNOSIS — I10 Essential (primary) hypertension: Secondary | ICD-10-CM | POA: Diagnosis not present

## 2021-04-17 DIAGNOSIS — Z8249 Family history of ischemic heart disease and other diseases of the circulatory system: Secondary | ICD-10-CM | POA: Insufficient documentation

## 2021-04-17 HISTORY — PX: ESOPHAGOGASTRODUODENOSCOPY (EGD) WITH PROPOFOL: SHX5813

## 2021-04-17 HISTORY — PX: PEG PLACEMENT: SHX5437

## 2021-04-17 LAB — GLUCOSE, CAPILLARY
Glucose-Capillary: 115 mg/dL — ABNORMAL HIGH (ref 70–99)
Glucose-Capillary: 111 mg/dL — ABNORMAL HIGH (ref 70–99)

## 2021-04-17 SURGERY — ESOPHAGOGASTRODUODENOSCOPY (EGD) WITH PROPOFOL
Anesthesia: General

## 2021-04-17 MED ORDER — MIDAZOLAM HCL 2 MG/2ML IJ SOLN
INTRAMUSCULAR | Status: DC | PRN
  Administered 2021-04-17: 1 mg via INTRAVENOUS

## 2021-04-17 MED ORDER — MIDAZOLAM HCL 2 MG/2ML IJ SOLN
INTRAMUSCULAR | Status: AC
  Filled 2021-04-17: qty 2

## 2021-04-17 MED ORDER — FENTANYL CITRATE (PF) 100 MCG/2ML IJ SOLN
INTRAMUSCULAR | Status: AC
  Filled 2021-04-17: qty 2

## 2021-04-17 MED ORDER — LIDOCAINE HCL 1 % IJ SOLN
INTRAMUSCULAR | Status: DC | PRN
  Administered 2021-04-17: 5 mL

## 2021-04-17 MED ORDER — HYDROCODONE-ACETAMINOPHEN 7.5-325 MG/15ML PO SOLN: 10.0000 mL | Freq: Four times a day (QID) | ORAL | 0 refills | Status: DC | PRN

## 2021-04-17 MED ORDER — KETAMINE HCL 50 MG/ML IJ SOLN
INTRAMUSCULAR | Status: DC | PRN
  Administered 2021-04-17: 20 mg via INTRAMUSCULAR

## 2021-04-17 MED ORDER — FENTANYL CITRATE (PF) 100 MCG/2ML IJ SOLN
INTRAMUSCULAR | Status: DC | PRN
  Administered 2021-04-17: 50 ug via INTRAVENOUS

## 2021-04-17 MED ORDER — KETAMINE HCL 50 MG/5ML IJ SOSY
PREFILLED_SYRINGE | INTRAMUSCULAR | Status: AC
  Filled 2021-04-17: qty 5

## 2021-04-17 MED ORDER — CHLORHEXIDINE GLUCONATE CLOTH 2 % EX PADS: 6.0000 | MEDICATED_PAD | Freq: Once | CUTANEOUS | Status: DC

## 2021-04-17 MED ORDER — CLINDAMYCIN PHOSPHATE 900 MG/50ML IV SOLN: 900.0000 mg | INTRAVENOUS | Status: DC

## 2021-04-17 MED ORDER — CLINDAMYCIN PHOSPHATE 900 MG/50ML IV SOLN
INTRAVENOUS | Status: AC
  Filled 2021-04-17: qty 50

## 2021-04-17 MED ORDER — PROPOFOL 500 MG/50ML IV EMUL
INTRAVENOUS | Status: DC | PRN
  Administered 2021-04-17: 150 ug/kg/min via INTRAVENOUS

## 2021-04-17 MED ORDER — LIDOCAINE HCL (PF) 2 % IJ SOLN
INTRAMUSCULAR | Status: AC
  Filled 2021-04-17: qty 5

## 2021-04-17 MED ORDER — ORAL CARE MOUTH RINSE: 15.0000 mL | Freq: Once | OROMUCOSAL | Status: DC

## 2021-04-17 MED ORDER — BACITRACIN ZINC 500 UNIT/GM EX OINT
TOPICAL_OINTMENT | CUTANEOUS | Status: AC
  Filled 2021-04-17: qty 1.8

## 2021-04-17 MED ORDER — LIDOCAINE HCL (CARDIAC) PF 100 MG/5ML IV SOSY
PREFILLED_SYRINGE | INTRAVENOUS | Status: DC | PRN
  Administered 2021-04-17: 60 mg via INTRAVENOUS

## 2021-04-17 MED ORDER — WATER FOR IRRIGATION, STERILE IR SOLN
Status: DC | PRN
  Administered 2021-04-17: 1000 mL

## 2021-04-17 MED ORDER — LACTATED RINGERS IV SOLN: INTRAVENOUS | Status: DC

## 2021-04-17 MED ORDER — LIDOCAINE HCL (PF) 1 % IJ SOLN
INTRAMUSCULAR | Status: AC
  Filled 2021-04-17: qty 30

## 2021-04-17 MED ORDER — TRIPLE ANTIBIOTIC EX OINT
TOPICAL_OINTMENT | CUTANEOUS | Status: DC | PRN
  Administered 2021-04-17: 1 mL/h via SURGICAL_CAVITY

## 2021-04-17 MED ORDER — PROPOFOL 10 MG/ML IV BOLUS
INTRAVENOUS | Status: AC
  Filled 2021-04-17: qty 20

## 2021-04-17 MED ORDER — CHLORHEXIDINE GLUCONATE 0.12 % MT SOLN: 15.0000 mL | Freq: Once | OROMUCOSAL | Status: DC

## 2021-04-17 MED FILL — Neomycin-Bacitracin-Polymyxin Oint: CUTANEOUS | Qty: 1 | Status: AC

## 2021-04-17 SURGICAL SUPPLY — 6 items
CLOTH BEACON ORANGE TIMEOUT ST (SAFETY) ×2 IMPLANT
GLOVE SURG SS PI 7.5 STRL IVOR (GLOVE) ×2 IMPLANT
GLOVE SURG UNDER POLY LF SZ6.5 (GLOVE) ×2 IMPLANT
KIT PEG SAFETY 20FR (KITS) ×2 IMPLANT
MANIFOLD NEPTUNE II (INSTRUMENTS) ×2 IMPLANT
TAPE HYPAFIX 4 X10 (GAUZE/BANDAGES/DRESSINGS) ×2 IMPLANT

## 2021-04-17 NOTE — Transfer of Care (Signed)
Immediate Anesthesia Transfer of Care Note  Patient: Melinda Acevedo  Procedure(s) Performed: ESOPHAGOGASTRODUODENOSCOPY (EGD) WITH PROPOFOL (N/A ) PERCUTANEOUS ENDOSCOPIC GASTROSTOMY (PEG) PLACEMENT (N/A )  Patient Location: PACU  Anesthesia Type:General  Level of Consciousness: awake, drowsy and patient cooperative  Airway & Oxygen Therapy: Patient Spontanous Breathing and Patient connected to nasal cannula oxygen  Post-op Assessment: Report given to RN, Post -op Vital signs reviewed and stable and Patient moving all extremities X 4  Post vital signs: Reviewed and stable  Last Vitals:  Vitals Value Taken Time  BP 123/54 04/17/21 1057  Temp    Pulse 83 04/17/21 1100  Resp 17 04/17/21 1100  SpO2 95 % 04/17/21 1100  Vitals shown include unvalidated device data.  Last Pain:  Vitals:   04/17/21 1022  TempSrc: Oral  PainSc: 0-No pain      Patients Stated Pain Goal: 5 (79/39/03 0092)  Complications: No complications documented.

## 2021-04-17 NOTE — Anesthesia Preprocedure Evaluation (Signed)
Anesthesia Evaluation  Patient identified by MRN, date of birth, ID band Patient awake    Reviewed: Allergy & Precautions, NPO status , Patient's Chart, lab work & pertinent test results  Airway Mallampati: II  TM Distance: >3 FB Neck ROM: Full    Dental  (+) Dental Advisory Given, Teeth Intact   Pulmonary sleep apnea , former smoker,    Pulmonary exam normal breath sounds clear to auscultation       Cardiovascular hypertension, Pt. on medications + CAD and + Cardiac Stents  Normal cardiovascular exam Rhythm:Regular Rate:Normal     Neuro/Psych Anxiety    GI/Hepatic hiatal hernia, GERD  ,  Endo/Other  diabetes, Well Controlled, Type 2Hypothyroidism   Renal/GU      Musculoskeletal negative musculoskeletal ROS (+)   Abdominal   Peds  Hematology   Anesthesia Other Findings   Reproductive/Obstetrics negative OB ROS                             Anesthesia Physical Anesthesia Plan  ASA: III  Anesthesia Plan: General   Post-op Pain Management:    Induction: Intravenous  PONV Risk Score and Plan: TIVA  Airway Management Planned: Nasal Cannula and Natural Airway  Additional Equipment:   Intra-op Plan:   Post-operative Plan:   Informed Consent: I have reviewed the patients History and Physical, chart, labs and discussed the procedure including the risks, benefits and alternatives for the proposed anesthesia with the patient or authorized representative who has indicated his/her understanding and acceptance.     Dental advisory given  Plan Discussed with: CRNA and Surgeon  Anesthesia Plan Comments:         Anesthesia Quick Evaluation  

## 2021-04-17 NOTE — Clinical Social Work Note (Signed)
TOC contacted as pt was in need of [facility] services at d/c. Pts referral was sent [facility]. They will provvide education to the family. CSW also reached out to Dayton Scrape with [facility], she will look over pts records and reach out to the family to see about interest in supplies for the new tube feeds. CSW faxed all needs documents to [facility] and [facility]. TOC signing off.

## 2021-04-17 NOTE — Discharge Instructions (Signed)
PEG Tube Home Guide A percutaneous endoscopic gastrostomy (PEG) tube is used to deliver food, medicine, and fluids directly into the stomach. The tube has a clamp, a cap, and two anchors (bolsters). One bolster keeps the tube from coming out of the stomach. The other bolster holds the tube against the abdomen. You will be taught how to use and adjust your PEG tube before you leave the hospital. You will also be taught how to care for the opening (stoma) in your abdomen. Make sure that you understand:  How to care for your PEG tube.  How to care for your stoma.  How to give yourself feedings and medicines.  When to call your health care provider for help. Supplies needed:  Soapy water.  Clean, plain water.  Clean washcloth.  Bandage (dressing). This is optional.  Syringe. How to care for a PEG tube Check your PEG tube every day. Make sure:  It is not too tight. The bolster should rest gently over the stoma.  It is in the correct position. There is a mark on the tube that shows when it is in the correct position. Adjust the tube if you need to. Cleaning your stoma Clean your stoma every day. Follow these steps: 1. Wash your hands with soap and water for at least 20 seconds. If soap and water are not available, use hand sanitizer. 2. Check the skin around the stoma for redness, rash, swelling, drainage, or extra tissue growth. If you notice any of these, call your health care provider. 3. Wash the stoma and the skin around it using a clean, soft washcloth. Clean using a circular motion, and wipe away from the stoma opening, not toward it. ? Use warm, soapy water, and only use cleansers recommended by your health care provider. ? Rinse the stoma area with plain water. ? Pat the stoma area dry. 4. Place a dressing over the stoma if your health care provider told you to do that.   Giving yourself a feeding Your health care provider will give you instructions about:  How much  nutrition and fluid you will need for each feeding.  How often to have a feeding.  Whether to take medicine in the tube by itself or with a feeding. To give yourself a feeding, follow these steps: 1. Lay out all of the equipment that you will need. 2. Make sure that the nutritional formula is at room temperature. 3. Wash your hands with soap and water for at least 20 seconds. If soap and water are not available, use hand sanitizer. 4. Position yourself so that you are upright. You will need to stay upright throughout the feeding and for at least 30 minutes after the feeding. 5. Make sure the syringe plunger is pushed in. Place the tip of the syringe in clean water, and slowly pull the plunger to bring (draw up) the water into the syringe. 6. Remove the clamp and the cap from the PEG tube. 7. Push the water out of the syringe to clean (flush) the tube. 8. If the tube is clear, draw up the formula into the syringe. Make sure to use the right amount for each feeding and add water if necessary. 9. Slowly push the formula from the syringe through the tube. 10. After the feeding, flush the tube with water. 11. Put the clamp and the cap on the tube. 12. Stay sitting up or standing up straight for at least 30 minutes.   Giving yourself medicine To give  yourself medicine, follow these steps: 1. Lay out all of the equipment that you will need. 2. If your medicine is in tablet form, crush the tablet and dissolve it in water. 3. Wash your hands with soap and water for at least 20 seconds. If soap and water are not available, use hand sanitizer. 4. Position yourself so that you are upright. You will need to stay upright while you give yourself medicine and for at least 30 minutes afterward. 5. Make sure the syringe plunger is pushed in. Place the tip of the syringe in clean water, and slowly pull the plunger to bring (draw up) the water into the syringe. 6. Remove the clamp and the cap from the PEG  tube. 7. Push the water out of the syringe to clean (flush) the tube. 8. If the tube is clear, draw up the medicine into the syringe. 9. Slowly push the medicine from the syringe through the tube. 10. Flush the tube with water. 11. Put the clamp and the cap on the tube. 12. Stay sitting up or standing up straight for at least 30 minutes. Do not take sustained release (SR) medicines through your tube. If you are unsure if your medicine is an SR medicine, ask your health care provider or pharmacist.   Contact a health care provider if you have:  Soreness, redness, or irritation around your stoma.  Abdominal pain or bloating during or after your feedings.  Nausea, constipation, or diarrhea that will not go away.  A fever.  Problems with your PEG tube. Get help right away if:  Your tube is blocked.  Your tube falls out.  You have pain around your stoma. PATIENT INSTRUCTIONS POST-ANESTHESIA  IMMEDIATELY FOLLOWING SURGERY:  Do not drive or operate machinery for the first twenty four hours after surgery.  Do not make any important decisions for twenty four hours after surgery or while taking narcotic pain medications or sedatives.  If you develop intractable nausea and vomiting or a severe headache please notify your doctor immediately.  FOLLOW-UP:  Please make an appointment with your surgeon as instructed. You do not need to follow up with anesthesia unless specifically instructed to do so.  WOUND CARE INSTRUCTIONS (if applicable):  Keep a dry clean dressing on the anesthesia/puncture wound site if there is drainage.  Once the wound has quit draining you may leave it open to air.  Generally you should leave the bandage intact for twenty four hours unless there is drainage.  If the epidural site drains for more than 36-48 hours please call the anesthesia department.  QUESTIONS?:  Please feel free to call your physician or the hospital operator if you have any questions, and they will be  happy to assist you.       You are bleeding from your stoma.  Your tube is leaking.  You choke or you have trouble breathing during or after a feeding. Summary  A percutaneous endoscopic gastrostomy (PEG) tube is used to deliver food and fluids directly into the stomach.  You will be taught how to use and adjust your PEG tube. You will also be taught how to care for the stoma in your abdomen.  Your health care provider will give you instructions on how to give yourself nutritional formula and medicines through your PEG tube.  Contact your health care provider if you have a fever or soreness, redness, or irritation around your stoma.  Get help right away if your tube leaks, is blocked, or falls  out. Get help right away if you have pain or bleeding around your stoma. This information is not intended to replace advice given to you by your health care provider. Make sure you discuss any questions you have with your health care provider. Document Revised: 04/18/2020 Document Reviewed: 04/18/2020 Elsevier Patient Education  [place].

## 2021-04-17 NOTE — Anesthesia Postprocedure Evaluation (Signed)
Anesthesia Post Note  Patient: Laporshia Hogen  Procedure(s) Performed: ESOPHAGOGASTRODUODENOSCOPY (EGD) WITH PROPOFOL (N/A ) PERCUTANEOUS ENDOSCOPIC GASTROSTOMY (PEG) PLACEMENT (N/A )  Patient location during evaluation: PACU Anesthesia Type: General Level of consciousness: awake and alert and oriented Pain management: pain level controlled Vital Signs Assessment: post-procedure vital signs reviewed and stable Respiratory status: spontaneous breathing and respiratory function stable Cardiovascular status: blood pressure returned to baseline and stable Postop Assessment: no apparent nausea or vomiting Anesthetic complications: no   No complications documented.   Last Vitals:  Vitals:   04/17/21 1115 04/17/21 1130  BP: (!) 142/62 (!) 142/62  Pulse: 73 71  Resp: 15 14  Temp:    SpO2: 99% 96%    Last Pain:  Vitals:   04/17/21 1045  TempSrc:   PainSc: Asleep                 Jeanann Balinski C Marcy Bogosian

## 2021-04-17 NOTE — Op Note (Signed)
Patient:  Melinda Acevedo  DOB:  [birth year]  MRN:  [id number]   Preop Diagnosis:  Dysphagia   Postop Diagnosis:  Same  Procedure: EGD with PEG  Surgeon: Aviva Signs, MD  Anes: MAC  Indications: Patient is a 80 year old white female with dysphagia who now presents for EGD with PEG.  The risks and benefits of the procedure including bleeding, infection, and organ injury were fully explained to the patient, who gave informed consent.  Procedure note: The patient was placed in the supine position.  After monitored anesthesia care was given, a timeout was performed.  Surgical site confirmation was performed.  The endoscope was advanced down to the second portion of the duodenum without difficulty.  The first and second portions of the duodenum were within normal limits.  The pylorus was noted to be widely patent.  The stomach was within normal limits.  No gross ulcerations were seen.  No significant hiatal hernia was noted.  The abdominal wall was then prepped with Betadine.  A site in the epigastric region was palpated and was clearly seen in the antrum of the stomach.  1% Xylocaine was used for local anesthesia.  An incision was made and a needle catheter was advanced into the stomach under direct visualization without difficulty.  A guidewire was then advanced and grasped with a snare.  The endoscope was then retracted.  A 20 French gastrostomy tube was then attached to the wire and using the Ponsky pull technique, the gastrostomy tube was placed.  At the skin level, the bolster was placed at the 3 cm [place].  The endoscope was advanced back down to the stomach and the bulb was noted to be in appropriate position.  All air was then evacuated from the stomach prior to removal of the endoscope.  Triple antibiotic ointment was placed around the exit site and a dry sterile dressing was applied.  Patient tolerated the procedure well was transferred back to PACU in stable condition.  Complications:  None  EBL: Minimal  Specimen: None

## 2021-04-17 NOTE — Interval H&P Note (Signed)
History and Physical Interval Note:  04/17/2021 10:12 AM  Mercy Riding  has presented today for surgery, with the diagnosis of Dysphagia.  The various methods of treatment have been discussed with the patient and family. After consideration of risks, benefits and other options for treatment, the patient has consented to  Procedure(s): ESOPHAGOGASTRODUODENOSCOPY (EGD) WITH PROPOFOL (N/A) PERCUTANEOUS ENDOSCOPIC GASTROSTOMY (PEG) PLACEMENT (N/A) as a surgical intervention.  The patient's history has been reviewed, patient examined, no change in status, stable for surgery.  I have reviewed the patient's chart and labs.  Questions were answered to the patient's satisfaction.     Aviva Signs

## 2021-04-21 ENCOUNTER — Telehealth: Payer: Self-pay | Admitting: Nutrition

## 2021-04-21 ENCOUNTER — Encounter (HOSPITAL_COMMUNITY): Payer: Self-pay | Admitting: General Surgery

## 2021-04-21 NOTE — Telephone Encounter (Signed)
Follow up phone call to see how patient was tolerated her PEG placement and feedings. She notes she is doing fairly well. Her 2 daughters who are nurses have assisted along with [place]. She notes she is taking 125 mls 7 times per day with 30 mls of water flush before and after each feeding.  She is taking 2-3 tsp of broth and some ice chips PO but not really any solid foods. She notes her swallowing automatic reflex isn't working still.  Denies any coughing or choking on sips of liquids or ice chips.  She notes she hasnt 'had a BM in 5 days. Advised to talk to [facility] nurse and request medication assistance for that. Suggested she could try miralax or prune juice if interested, but needed to check with Dr. Arnoldo Morale or [facility] nurse for recommendations. She verbalized understanding.  Encouraged her to contact [facility] nurse if any issues with feedings. She verbalized understanding. Will follow up with her next week at her appointment in the office.

## 2021-04-23 ENCOUNTER — Other Ambulatory Visit: Payer: Self-pay

## 2021-04-23 ENCOUNTER — Emergency Department (HOSPITAL_COMMUNITY)
Admission: EM | Admit: 2021-04-23 | Discharge: 2021-04-23 | Disposition: A | Discharge status: Home / Self Care | Payer: Medicare Other | Attending: Emergency Medicine | Admitting: Emergency Medicine

## 2021-04-23 DIAGNOSIS — E119 Type 2 diabetes mellitus without complications: Secondary | ICD-10-CM | POA: Insufficient documentation

## 2021-04-23 DIAGNOSIS — Z79899 Other long term (current) drug therapy: Secondary | ICD-10-CM | POA: Diagnosis not present

## 2021-04-23 DIAGNOSIS — L539 Erythematous condition, unspecified: Secondary | ICD-10-CM | POA: Diagnosis present

## 2021-04-23 DIAGNOSIS — Z87891 Personal history of nicotine dependence: Secondary | ICD-10-CM | POA: Insufficient documentation

## 2021-04-23 DIAGNOSIS — E039 Hypothyroidism, unspecified: Secondary | ICD-10-CM | POA: Diagnosis not present

## 2021-04-23 DIAGNOSIS — Z7982 Long term (current) use of aspirin: Secondary | ICD-10-CM | POA: Diagnosis not present

## 2021-04-23 DIAGNOSIS — I2510 Atherosclerotic heart disease of native coronary artery without angina pectoris: Secondary | ICD-10-CM | POA: Insufficient documentation

## 2021-04-23 DIAGNOSIS — I10 Essential (primary) hypertension: Secondary | ICD-10-CM | POA: Insufficient documentation

## 2021-04-23 DIAGNOSIS — L309 Dermatitis, unspecified: Secondary | ICD-10-CM

## 2021-04-23 DIAGNOSIS — L710 Perioral dermatitis: Secondary | ICD-10-CM | POA: Diagnosis not present

## 2021-04-23 LAB — COMPREHENSIVE METABOLIC PANEL
ALT: 48 U/L — ABNORMAL HIGH (ref 0–44)
AST: 34 U/L (ref 15–41)
Albumin: 3.3 g/dL — ABNORMAL LOW (ref 3.5–5.0)
Alkaline Phosphatase: 67 U/L (ref 38–126)
Anion gap: 10 (ref 5–15)
BUN: 13 mg/dL (ref 8–23)
CO2: 30 mmol/L (ref 22–32)
Calcium: 9.2 mg/dL (ref 8.9–10.3)
Chloride: 94 mmol/L — ABNORMAL LOW (ref 98–111)
Creatinine, Ser: 0.54 mg/dL (ref 0.44–1.00)
GFR, Estimated: >60 mL/min (ref >60)
Glucose, Bld: 168 mg/dL — ABNORMAL HIGH (ref 70–99)
Potassium: 3.4 mmol/L — ABNORMAL LOW (ref 3.5–5.1)
Sodium: 134 mmol/L — ABNORMAL LOW (ref 135–145)
Total Bilirubin: 0.5 mg/dL (ref 0.3–1.2)
Total Protein: 7.6 g/dL (ref 6.5–8.1)

## 2021-04-23 LAB — CBC WITH DIFFERENTIAL/PLATELET
Abs Immature Granulocytes: 0.09 10*3/uL — ABNORMAL HIGH (ref 0.00–0.07)
Basophils Absolute: 0.1 10*3/uL (ref 0.0–0.1)
Basophils Relative: 0 %
Eosinophils Absolute: 0.2 10*3/uL (ref 0.0–0.5)
Eosinophils Relative: 2 %
HCT: 38 % (ref 36.0–46.0)
Hemoglobin: 12.4 g/dL (ref 12.0–15.0)
Immature Granulocytes: 1 %
Lymphocytes Relative: 11 %
Lymphs Abs: 1.8 10*3/uL (ref 0.7–4.0)
MCH: 30.7 pg (ref 26.0–34.0)
MCHC: 32.6 g/dL (ref 30.0–36.0)
MCV: 94.1 fL (ref 80.0–100.0)
Monocytes Absolute: 1.9 10*3/uL — ABNORMAL HIGH (ref 0.1–1.0)
Monocytes Relative: 12 %
Neutro Abs: 11.8 10*3/uL — ABNORMAL HIGH (ref 1.7–7.7)
Neutrophils Relative %: 74 %
Platelets: 334 10*3/uL (ref 150–400)
RBC: 4.04 MIL/uL (ref 3.87–5.11)
RDW: 13.7 % (ref 11.5–15.5)
WBC: 15.9 10*3/uL — ABNORMAL HIGH (ref 4.0–10.5)
nRBC: 0 % (ref 0.0–0.2)

## 2021-04-23 LAB — LACTIC ACID, PLASMA
Lactic Acid, Venous: 1 mmol/L (ref 0.5–1.9)
Lactic Acid, Venous: 1.2 mmol/L (ref 0.5–1.9)

## 2021-04-23 MED ORDER — SODIUM CHLORIDE 0.9 % IV SOLN
1.5000 g | Freq: Once | INTRAVENOUS | Status: AC
  Administered 2021-04-23: 1.5 g via INTRAVENOUS
  Filled 2021-04-23: qty 4

## 2021-04-23 MED ORDER — LACTATED RINGERS IV BOLUS
1000.0000 mL | Freq: Once | INTRAVENOUS | Status: AC
  Administered 2021-04-23: 1000 mL via INTRAVENOUS

## 2021-04-23 MED ORDER — AMOXICILLIN-POT CLAVULANATE 400-57 MG/5ML PO SUSR: 875.0000 mg | Freq: Two times a day (BID) | ORAL | 0 refills | Status: AC

## 2021-04-23 NOTE — ED Provider Notes (Signed)
[facility] EMERGENCY DEPARTMENT Provider Note   CSN: [id number] Arrival date & time: 04/23/21  0122     History No chief complaint on file.   Melinda Acevedo is a 80 y.o. female.  Had a PEG tube placed last week for dysphagia.  Was doing okay within the last 24 hours or so she had worsening erythema around the site with a mild irritation and burning.  She states that there is some abnormal looking drainage on the area as well.  Her daughters give her the feeds and they seem to be going in okay.  She has no severe pain with this.  The daughter reports no significant pressure to get the feeds in.  Patient temperature today of 99.9 this with the redness around the tube site worried her and brought her here for further evaluation.  No other associated symptoms.        Past Medical History:  Diagnosis Date  . Anxiety   . CAD (coronary artery disease)   . Carotid artery occlusion   . Diabetes mellitus without complication ([place])   . Glaucoma   . History of hiatal hernia   . Hyperlipidemia   . Hypertension   . Irregular heart beats   . Seasonal allergies   . Sleep apnea     Patient Active Problem List   Diagnosis Date Noted  . Dysphagia 04/03/2021  . Anxiety 01/06/2021  . Constipation, unspecified 01/06/2021  . Diverticulitis 01/06/2021  . Fatigue 01/06/2021  . GERD (gastroesophageal reflux disease) 01/06/2021  . Hyperlipidemia 01/06/2021  . Hypertension 01/06/2021  . Hypothyroidism 01/06/2021  . Impaired fasting glucose 01/06/2021  . Osteoporosis 01/06/2021  . Vitamin D deficiency 01/06/2021  . Neck pain 01/01/2021  . History of colonic polyps 01/01/2021  . Obstructive sleep apnea 08/26/2017    Past Surgical History:  Procedure Laterality Date  . ABDOMINAL HYSTERECTOMY    . CATARACT EXTRACTION Bilateral   . CHOLECYSTECTOMY    . COLONOSCOPY  2017   Dr.Williams in [place] , [place]  . COLONOSCOPY WITH PROPOFOL N/A 02/06/2021   Procedure: COLONOSCOPY WITH PROPOFOL;   Surgeon: Harvel Quale, MD;  Location: [facility] ENDO SUITE;  Service: Gastroenterology;  Laterality: N/A;  10:30  . ESOPHAGOGASTRODUODENOSCOPY (EGD) WITH PROPOFOL N/A 02/06/2021   Procedure: ESOPHAGOGASTRODUODENOSCOPY (EGD) WITH PROPOFOL;  Surgeon: Harvel Quale, MD;  Location: [facility] ENDO SUITE;  Service: Gastroenterology;  Laterality: N/A;  . ESOPHAGOGASTRODUODENOSCOPY (EGD) WITH PROPOFOL N/A 04/17/2021   Procedure: ESOPHAGOGASTRODUODENOSCOPY (EGD) WITH PROPOFOL;  Surgeon: Aviva Signs, MD;  Location: [facility] ORS;  Service: General;  Laterality: N/A;  . heart catherization  [id number], 2010  . PEG PLACEMENT N/A 04/17/2021   Procedure: PERCUTANEOUS ENDOSCOPIC GASTROSTOMY (PEG) PLACEMENT;  Surgeon: Aviva Signs, MD;  Location: [facility] ORS;  Service: General;  Laterality: N/A;  . RECTOCELE REPAIR    . TONSILLECTOMY    . TUBAL LIGATION  1986  . UPPER GASTROINTESTINAL ENDOSCOPY  2015   Dr.Williams in [place], [place]  . VAGINAL HYSTERECTOMY  1991     OB History   No obstetric history on file.     Family History  Problem Relation Age of Onset  . COPD Mother   . Heart disease Mother   . Heart disease Father   . Heart attack Father   . Hypertension Sister   . Mitral valve prolapse Brother     Social History   Tobacco Use  . Smoking status: Former Smoker    Years: 2.00    Types: Cigarettes  Quit date: 1964    Years since quitting: 58.3  . Smokeless tobacco: Never Used  Vaping Use  . Vaping Use: Never used  Substance Use Topics  . Alcohol use: No  . Drug use: No    Home Medications Prior to Admission medications   Medication Sig Start Date End Date Taking? Authorizing Provider  amoxicillin-clavulanate (AUGMENTIN) 400-57 MG/5ML suspension Take 10.9 mLs (875 mg total) by mouth 2 (two) times daily for 10 days. 04/23/21 05/03/21 Yes Rotunda Worden, Corene Cornea, MD  amLODipine (NORVASC) 5 MG tablet Take 5 mg by mouth daily.    [provider]  Apple Cider Vinegar 500 MG TABS Take 500  mg by mouth daily.    [provider]  aspirin 81 MG tablet Take 81 mg by mouth daily.    [provider]  benazepril (LOTENSIN) 20 MG tablet Take 0.5 tablets (10 mg total) by mouth 2 (two) times daily. 04/03/21   Johnson, Clanford L, MD  Cholecalciferol (VITAMIN D) 125 MCG (5000 UT) CAPS Take 5,000 Units by mouth daily.     [provider]  Coenzyme Q10 (COQ10) 200 MG CAPS Take 200 mg by mouth daily.    [provider]  HYDROcodone-acetaminophen (HYCET) 7.5-325 mg/15 ml solution Take 10 mLs by mouth every 6 (six) hours as needed for moderate pain. 04/17/21 04/17/22  Aviva Signs, MD  loratadine (CLARITIN) 10 MG tablet Take 10 mg by mouth daily.    [provider]  LORazepam (ATIVAN) 0.5 MG tablet Take 0.5 mg by mouth 3 (three) times daily as needed for anxiety.    [provider]  Misc Natural Products (ELDERBERRY ZINC/VIT C/IMMUNE MT) Take 1 tablet by mouth daily.    [provider]  Multiple Vitamins-Minerals (MULTIVITAMIN ADULT PO) Take 2 tablets by mouth daily.    [provider]  nitroGLYCERIN (NITROSTAT) 0.4 MG SL tablet Place 0.4 mg under the tongue every 5 (five) minutes as needed for chest pain.    [provider]  ondansetron (ZOFRAN) 4 MG tablet Take 4 mg by mouth every 8 (eight) hours as needed for vomiting or nausea. 01/19/21   [provider]  RABEprazole (ACIPHEX) 20 MG tablet Take 20 mg by mouth daily.    [provider]  rosuvastatin (CRESTOR) 10 MG tablet Take 10 mg by mouth daily.    [provider]  Travoprost, BAK Free, (TRAVATAN) 0.004 % SOLN ophthalmic solution Place 1 drop into both eyes at bedtime.    [provider]    Allergies    Contrast media [iodinated diagnostic agents], Alendronate, Cinoxacin, Diltiazem, Keflex [cephalexin], Naproxen, Nifedipine, Norfloxacin, Pravastatin sodium, Trimethoprim, and Valdecoxib  Review of Systems   Review of Systems   All other systems reviewed and are negative.   Physical Exam Updated Vital Signs BP (!) 127/59   Pulse 72   Temp 99.1 F (37.3 C) (Oral)   Resp 15   Ht 5' (1.524 m)   Wt 65.8 kg   SpO2 94%   BMI 28.32 kg/m   Physical Exam Vitals and nursing note reviewed.  Constitutional:      Appearance: She is well-developed.  HENT:     Head: Normocephalic and atraumatic.     Nose: No congestion or rhinorrhea.     Mouth/Throat:     Mouth: Mucous membranes are moist.     Pharynx: Oropharynx is clear.  Eyes:     Pupils: Pupils are equal, round, and reactive to light.  Cardiovascular:  Rate and Rhythm: Normal rate and regular rhythm.  Pulmonary:     Effort: No respiratory distress.     Breath sounds: No stridor.  Abdominal:     General: There is no distension.  Musculoskeletal:        General: No swelling or tenderness. Normal range of motion.     Cervical back: Normal range of motion.  Skin:    General: Skin is warm and dry.     Comments: See picture below  Neurological:     General: No focal deficit present.     Mental Status: She is alert.        ED Results / Procedures / Treatments   Labs (all labs ordered are listed, but only abnormal results are displayed) Labs Reviewed  CBC WITH DIFFERENTIAL/PLATELET - Abnormal; Notable for the following components:      Result Value   WBC 15.9 (*)    Neutro Abs 11.8 (*)    Monocytes Absolute 1.9 (*)    Abs Immature Granulocytes 0.09 (*)    All other components within normal limits  COMPREHENSIVE METABOLIC PANEL - Abnormal; Notable for the following components:   Sodium 134 (*)    Potassium 3.4 (*)    Chloride 94 (*)    Glucose, Bld 168 (*)    Albumin 3.3 (*)    ALT 48 (*)    All other components within normal limits  LACTIC ACID, PLASMA  LACTIC ACID, PLASMA    EKG None  Radiology No results found.  Procedures Procedures   Medications Ordered in ED Medications  ampicillin-sulbactam (UNASYN) 1.5 g in sodium  chloride 0.9 % 100 mL IVPB (0 g Intravenous Stopped 04/23/21 0354)  lactated ringers bolus 1,000 mL (0 mLs Intravenous Stopped 04/23/21 0431)    ED Course  I have reviewed the triage vital signs and the nursing notes.  Pertinent labs & imaging results that were available during my care of the patient were reviewed by me and considered in my medical decision making (see chart for details).    MDM Rules/Calculators/A&P                          Likely peristomal dermatitis however her white count is higher than her baseline by about 50%.  We will going give antibiotics pending follow-up with surgery.  Low suspicion for abscess.  She does have some discharge mixed in with the blood in the area however feel he is more consistent with her tube feeds rather than purulent material.  Final Clinical Impression(s) / ED Diagnoses Final diagnoses:  Peristomal dermatitis    Rx / DC Orders ED Discharge Orders         Ordered    amoxicillin-clavulanate (AUGMENTIN) 400-57 MG/5ML suspension  2 times daily        04/23/21 0411           Chastidy Ranker, Corene Cornea, MD 04/24/21 2025

## 2021-04-23 NOTE — ED Triage Notes (Signed)
PEG tube was placed Friday and started draining mucus-like fluid yesterday.  Followed up with home health nurse and she wasn't concerned. But tonight pt started spiking a fever (99.9), so pt decided to come get checked out.

## 2021-04-27 ENCOUNTER — Telehealth: Payer: Self-pay | Admitting: Nutrition

## 2021-04-27 NOTE — Telephone Encounter (Signed)
TC patient regarding recent labs that were done in the ER from her last visit for injection around PEG site. She notes she has been giving 60 mls of water before and after feedings. With her lower sodium level, I advised to reduce her water flushes to 30 mls before and after each 200 ml bolus of Glucerna 6 times per day. She notes she is still not able to swallow well at all. Hasn't been drinking much water or fluids otherwise. But admits to urinating often. Has an appointment with speech therapist in 2 weeks. She verbalized understanding. WIll see Dr. Arnoldo Morale and myself tomorrow.  CMP Latest Ref Rng & Units 04/23/2021 04/10/2021 04/03/2021  Glucose 70 - 99 mg/dL 168(H) 83 121(H)  BUN 8 - 23 mg/dL 13 19 17   Creatinine 0.44 - 1.00 mg/dL 0.54 0.56 0.64  Sodium 135 - 145 mmol/L 134(L) 142 142  Potassium 3.5 - 5.1 mmol/L 3.4(L) 3.3(L) 3.4(L)  Chloride 98 - 111 mmol/L 94(L) 107 102  CO2 22 - 32 mmol/L 30 24 27   Calcium 8.9 - 10.3 mg/dL 9.2 8.6(L) 9.6  Total Protein 6.5 - 8.1 g/dL 7.6 - 8.1  Total Bilirubin 0.3 - 1.2 mg/dL 0.5 - 0.6  Alkaline Phos 38 - 126 U/L 67 - 61  AST 15 - 41 U/L 34 - 25  ALT 0 - 44 U/L 48(H) - 20

## 2021-04-28 ENCOUNTER — Other Ambulatory Visit: Payer: Self-pay

## 2021-04-28 ENCOUNTER — Encounter: Payer: Self-pay | Admitting: Nutrition

## 2021-04-28 ENCOUNTER — Encounter: Payer: Medicare Other | Attending: Family Medicine | Admitting: Nutrition

## 2021-04-28 ENCOUNTER — Encounter: Payer: Self-pay | Admitting: General Surgery

## 2021-04-28 ENCOUNTER — Ambulatory Visit (INDEPENDENT_AMBULATORY_CARE_PROVIDER_SITE_OTHER): Payer: Medicare Other | Admitting: General Surgery

## 2021-04-28 VITALS — Ht 60.0 in | Wt 146.8 lb

## 2021-04-28 VITALS — BP 111/68 | HR 81 | Temp 98.5°F | Resp 16 | Ht 60.0 in | Wt 147.0 lb

## 2021-04-28 DIAGNOSIS — I10 Essential (primary) hypertension: Secondary | ICD-10-CM | POA: Diagnosis present

## 2021-04-28 DIAGNOSIS — Z09 Encounter for follow-up examination after completed treatment for conditions other than malignant neoplasm: Secondary | ICD-10-CM

## 2021-04-28 DIAGNOSIS — E782 Mixed hyperlipidemia: Secondary | ICD-10-CM | POA: Insufficient documentation

## 2021-04-28 DIAGNOSIS — E119 Type 2 diabetes mellitus without complications: Secondary | ICD-10-CM | POA: Insufficient documentation

## 2021-04-28 DIAGNOSIS — R1310 Dysphagia, unspecified: Secondary | ICD-10-CM | POA: Insufficient documentation

## 2021-04-28 DIAGNOSIS — R634 Abnormal weight loss: Secondary | ICD-10-CM | POA: Diagnosis present

## 2021-04-28 NOTE — Progress Notes (Signed)
Subjective:     Melinda Acevedo  Patient had an episode of irritation in redness around the gastrostomy tube site.  She was seen in the emergency room.  She was started on Augmentin.  Since that time, the drainage has decreased and she now just follows up for wound check.  She has had no problems with the tube feeds themselves. Objective:    BP 111/68   Pulse 81   Temp 98.5 F (36.9 C) (Other (Comment))   Resp 16   Ht 5' (1.524 m)   Wt 147 lb (66.7 kg)   SpO2 93%   BMI 28.71 kg/m   General:  alert, cooperative and no distress  PEG tube site with significantly less erythema.  Minimal drainage noted.  This is improved as compared to pictures on epic from the ER visit.  Bolster at 3-1/2 cm Thula Stewart.     Assessment:    Abdominal wall cellulitis secondary to gastrostomy tube placement.  Significantly improved.    Plan:   Finish antibiotic course.  Keep exit site clean and dry with soap and water.  May apply Neosporin ointment as needed.  Follow-up with me as needed.

## 2021-04-28 NOTE — Progress Notes (Signed)
Medical Nutrition Therapy  Appointment Start time:  1017 Appointment End time:  1550 Primary concerns today: Dysphagia Referral diagnosis: R13.10 Preferred learning style:  no preference indicated Learning readiness: change in progress   NUTRITION ASSESSMENT  She is here with her husband. She notes she has some gassiness and frequent bowls but not loose. No significant PO Intake. Doesn't desire to eat right now because of the mental stress and physical demand to cook something. She feels weak often.  Tolerating Glucerna 200 mls 6 times per day with water flushes. Her PEG site is healing well now and she is just about to finish her doses of Augmentin this Saturday. She notes her taste buds have a bad taste and doesn't make anything she tries PO to take good.  Still struggles with initiation to swallow. Does better with foods of mashed potato consistency. Appt with SLP on the 18th for evaluation.  Considering to see a Horticulturist, commercial for second opinions. N No choking feelings or strangulation.Just no reflex to swallow when food is in her mouth. Has an abdominal binder she uses to protect PEG tubing etc  Gained almost 2 lbs since 04/23/21. BP slightly lower at 111/68 at the surgeon's office. Tolerating bolus feedings well. She feels she can tolerate a little more volume for calorie increase at feedings.   Anthropometrics  .  Ht: 60" Wt today 151 lbs. Previous wt: 164 lbs in February 2022. Wt loss of 13 lbs;  9% weight loss in a month. IBW 45.4 kg   151% iBW ABW 68.6 kg BMI 29.7  TEE: 1715 Kcal/d (25 kcal/kg) Pro: 55 g Protein (.8 g/kg) CHO: 126 g Fat 72 g Fluids: 1700-2000 mls per day.(25/30 mls/kg)  Since she isn't eating anything PO of significance, will need to increase PEG feedings to meet closer to her nutrient needs 100%.   Recommend to increase feedings to 250 mls of GLucerna 6 times per day with 60 mils of water flush before and after each bolus x 1 week. If tolerated,  the following week, increase feedings to 330 mls of Glucerna  5 times per day instead of 6 times. Flush with 80 mls before and after each feeding.  330 mls of Glucerna 5 times per day with 160 mls of water flushes 5 times per day will provide: 1650 kcal 2120 mls of free water 78.7 g PRO 185 g CHO 59 g Fat  [facility] Hx: Dysphagia Medications: see chart Labs: A1C 6.5%.  CMP Latest Ref Rng & Units 04/23/2021 04/10/2021 04/03/2021  Glucose 70 - 99 mg/dL 168(H) 83 121(H)  BUN 8 - 23 mg/dL 13 19 17   Creatinine 0.44 - 1.00 mg/dL 0.54 0.56 0.64  Sodium 135 - 145 mmol/L 134(L) 142 142  Potassium 3.5 - 5.1 mmol/L 3.4(L) 3.3(L) 3.4(L)  Chloride 98 - 111 mmol/L 94(L) 107 102  CO2 22 - 32 mmol/L 30 24 27   Calcium 8.9 - 10.3 mg/dL 9.2 8.6(L) 9.6  Total Protein 6.5 - 8.1 g/dL 7.6 - 8.1  Total Bilirubin 0.3 - 1.2 mg/dL 0.5 - 0.6  Alkaline Phos 38 - 126 U/L 67 - 61  AST 15 - 41 U/L 34 - 25  ALT 0 - 44 U/L 48(H) - 20   . Notable Signs/Symptoms: weak, dysphagia, fear of getting strangled and aspirating.   Lifestyle & Dietary Hx Lives with her husband, Has issues with dysphagia.  Estimated daily fluid intake: 12 oz Supplements: none Sleep: varies Stress / self-care: stressed over swallowing Current average  weekly physical activity: ADL  24-Hr Dietary Recall No significant PO intake of solid foods due to problems of initiation of swallowing.  Estimated Energy Needs Calories: 1715 kcal Carbohydrate: 170g Protein: 55 gg Fat: 57g   NUTRITION DIAGNOSIS  [place]-1.1 Swallowing difficulty As related to dypshagia.  As evidenced by unable to swallow solid foods. Can't voluntariily swallow foods or liquids.. History of aspiration on sodas and foods in the past.    NUTRITION INTERVENTION  Nutrition education (E-1) on the following topics:  . Nutritional needs, dyspahgia, swallowing issues, anxiety issues related to current issues and benefits of counseling for anxiety.    Recommendations/Goals Increase TF for 240 mls 6 times per day x 1 week. If Tolerated, increase to : 330 mls of Glucerna 5 times per day with 160 mls of water flushes 5 times per day will provide: 1650 kcal 2120 mls of free water 78.7 g PRO 185 g CHO 59 g Fat  Weigh self weekly. Check BP daily. Notify MD or Dr. Arnoldo Morale if any problems with BP or TF.   Handouts Provided Include   Pureed and minced foods to try tolerate.  Learning Style & Readiness for Change Teaching method utilized: Visual & Auditory  Demonstrated degree of understanding via: Teach Back  Barriers to learning/adherence to lifestyle change: none   Goals Established by Pt  330 mls of Glucerna 5 times per day with 160 mls of water flushes 5 times per day will provide: 1650 kcal/d 2120 mls of free water 78.7 g PRO 185 g CHO 59 g Fat   MONITORING & EVALUATION Dietary intake, weekly physical activity, and PEG Feedings in 2-3 weeks.Marland Kitchen  Next Steps  Keep appointment with speech therapist for reevaluation. Ace Gins is suppose to call pt. Today to discuss the shipment of Glucerna and track where it went and to provide a supply to her ASAP.

## 2021-04-28 NOTE — Patient Instructions (Addendum)
  Goals Increase TF for 240 mls 6 times per day x 1 week. If Tolerated, increase to : 330 mls of Glucerna 5 times per day with 160 mls of water flushes 5 times per day will provide: 1650 kcal/d 2120 mls of free water 78.7 g PRO 185 g CHO 59 g Fat  Weigh self weekly. Check BP daily. Notify MD or Dr. Arnoldo Morale if any problems with BP or TF.

## 2021-04-29 ENCOUNTER — Telehealth: Payer: Self-pay | Admitting: Nutrition

## 2021-04-29 ENCOUNTER — Telehealth: Payer: Self-pay | Admitting: Family Medicine

## 2021-04-29 NOTE — Telephone Encounter (Signed)
TC to Dr. Arnoldo Morale office and spoke with [place].  She will send prescription for increasing TF  to 330 mls of Glucerna to be given via PEG tube 5 times per day with 80 mils of water flush before and after each feeding to [facility] in [place], [place]. Fax number [phone]

## 2021-04-29 NOTE — Telephone Encounter (Signed)
Received call from Tamsen Roers stating that [facility] in [place] needs a new order to increase patient's Glucerna from 230 mls to 330 mls 5 x a day with water flushes of 80 mls before and after feeding. (sig read back to Ms. Crumpton for correctness)  If we can get the order to them then they can get the increase amount out to her on Monday.  Order faxed to [facility] at [phone] and received confirmation.

## 2021-04-29 NOTE — Telephone Encounter (Signed)
TC to [facility] enteral feeding department. I was informed they were going to contact the [facility] in [place] to see about what happened about  the patient's Glucerna delivered. Patient has not received it yet. It was not delivered to her house as reported. She has been buying Glucerna herself right now for her bolus feedings since she can't take anything PO due to swallowing issues.  Patient notes she got a call from [facility]  From a 919 area code and they said they were going to have to send another shipment out and it would arrive Monday.  She notes she is tolerating 237 mls of Glucerna 6 times per day with water flushes. Complains of being hungry.  Will get Dr. Arnoldo Morale order faxed to [facility] in [place]:   to increase Glucerna to 330 mls 5 times per day with water flushes via PEG feedings. Flush with 60 mls before and after each bolus feeding.  Waiting on a call back from [facility] in [place] to confirm the above.

## 2021-04-30 ENCOUNTER — Ambulatory Visit: Payer: Medicare Other | Admitting: Nutrition

## 2021-05-04 ENCOUNTER — Telehealth: Payer: Self-pay | Admitting: Cardiology

## 2021-05-04 NOTE — Telephone Encounter (Signed)
Today BP 128/70 8:00 am & 136/73 @10 :30 am Reports when BP was 105/69 on Saturday, she did not have any symptoms fatigue, dizziness, sleepiness. Reports feeling fine. Medications reviewed Advised that one episode of a lower BP without symptoms doesn't need to have BP meds held. Advised would need more BP readings to determine if BP medications needed to be adjusted Advised to stay well hydrated and contact our office if her BP is consistently low. Verbalized understanding of plan.

## 2021-05-04 NOTE — Telephone Encounter (Signed)
New message    Patient concerned her blood pressure keeps dropping - she hasnt been taking her medication  Saturday 105/69  Sunday 134/69  140/69   What does she need to do ?

## 2021-05-05 NOTE — Telephone Encounter (Signed)
Agree with your assessment. Bp 105/69 would be considered low normal and acceptable range for her. She can keep [place] updated on her bp trends   Melinda Abts MD

## 2021-05-07 ENCOUNTER — Other Ambulatory Visit: Payer: Self-pay

## 2021-05-07 ENCOUNTER — Encounter: Payer: Self-pay | Admitting: General Surgery

## 2021-05-07 ENCOUNTER — Ambulatory Visit (INDEPENDENT_AMBULATORY_CARE_PROVIDER_SITE_OTHER): Payer: Medicare Other | Admitting: General Surgery

## 2021-05-07 VITALS — BP 149/64 | HR 80 | Temp 98.6°F | Resp 16 | Ht 60.0 in | Wt 145.0 lb

## 2021-05-07 DIAGNOSIS — K9423 Gastrostomy malfunction: Secondary | ICD-10-CM

## 2021-05-07 MED ORDER — PREDNISONE 50 MG PO TABS: 50.0000 mg | ORAL_TABLET | Freq: Three times a day (TID) | ORAL | 0 refills | Status: DC | PRN

## 2021-05-07 NOTE — Patient Instructions (Signed)
Take one tablet of prednisone at 11pm tonight, 5am tomorrow, and 11am tomorrow. Take a Benadryl tablet (50mg ) tomorrow at 11am. Go to [facility] by 11:30am and register for Interventional Radiology.

## 2021-05-07 NOTE — Progress Notes (Signed)
Subjective:     Melinda Acevedo  Patient presents with difficulty with her PEG tube.  She states she pulled on it yesterday and since then has had problems flushing it.  The flush comes out of the PEG exit site.  It does not flush easily. Objective:    BP (!) 149/64   Pulse 80   Temp 98.6 F (37 C) (Other (Comment))   Resp 16   Ht 5' (1.524 m)   Wt 145 lb (65.8 kg)   SpO2 94%   BMI 28.32 kg/m   General:  alert, cooperative and no distress  PEG site clean and dry.  I was unable to flush the PEG tube.     Assessment:    PEG tube malfunction, question displacement    Plan:   I have contacted interventional radiology.  Due to a contrast allergy, she needs to be pretreated with prednisone and they will replace the gastrostomy tube tomorrow at noon.  Prednisone and Benadryl have been prescribed and has been explained to the patient.

## 2021-05-08 ENCOUNTER — Ambulatory Visit (HOSPITAL_COMMUNITY)
Admission: RE | Admit: 2021-05-08 | Discharge: 2021-05-08 | Disposition: A | Discharge status: Home / Self Care | Payer: Medicare Other | Source: Ambulatory Visit | Attending: General Surgery | Admitting: General Surgery

## 2021-05-08 ENCOUNTER — Other Ambulatory Visit: Payer: Self-pay | Admitting: General Surgery

## 2021-05-08 ENCOUNTER — Encounter: Payer: Self-pay | Admitting: General Surgery

## 2021-05-08 ENCOUNTER — Other Ambulatory Visit: Payer: Self-pay

## 2021-05-08 DIAGNOSIS — K9423 Gastrostomy malfunction: Secondary | ICD-10-CM | POA: Diagnosis not present

## 2021-05-08 MED ORDER — IOHEXOL 300 MG/ML  SOLN
50.0000 mL | Freq: Once | INTRAMUSCULAR | Status: AC | PRN
  Administered 2021-05-08: 20 mL

## 2021-05-11 ENCOUNTER — Other Ambulatory Visit (HOSPITAL_COMMUNITY): Payer: Self-pay | Admitting: Student

## 2021-05-11 DIAGNOSIS — Z431 Encounter for attention to gastrostomy: Secondary | ICD-10-CM

## 2021-05-12 ENCOUNTER — Inpatient Hospital Stay (HOSPITAL_COMMUNITY)
Admission: EM | Admit: 2021-05-12 | Discharge: 2021-05-14 | DRG: 395 | Disposition: A | Discharge status: Home / Self Care | Payer: Medicare Other | Attending: General Surgery | Admitting: General Surgery

## 2021-05-12 ENCOUNTER — Encounter (HOSPITAL_COMMUNITY): Payer: Self-pay

## 2021-05-12 ENCOUNTER — Other Ambulatory Visit: Payer: Self-pay

## 2021-05-12 DIAGNOSIS — Z7982 Long term (current) use of aspirin: Secondary | ICD-10-CM

## 2021-05-12 DIAGNOSIS — T85528A Displacement of other gastrointestinal prosthetic devices, implants and grafts, initial encounter: Secondary | ICD-10-CM

## 2021-05-12 DIAGNOSIS — K9423 Gastrostomy malfunction: Secondary | ICD-10-CM | POA: Diagnosis present

## 2021-05-12 DIAGNOSIS — F419 Anxiety disorder, unspecified: Secondary | ICD-10-CM | POA: Diagnosis not present

## 2021-05-12 DIAGNOSIS — I10 Essential (primary) hypertension: Secondary | ICD-10-CM | POA: Diagnosis present

## 2021-05-12 DIAGNOSIS — I2510 Atherosclerotic heart disease of native coronary artery without angina pectoris: Secondary | ICD-10-CM | POA: Diagnosis present

## 2021-05-12 DIAGNOSIS — Z79899 Other long term (current) drug therapy: Secondary | ICD-10-CM

## 2021-05-12 DIAGNOSIS — Z888 Allergy status to other drugs, medicaments and biological substances status: Secondary | ICD-10-CM | POA: Diagnosis not present

## 2021-05-12 DIAGNOSIS — Z91041 Radiographic dye allergy status: Secondary | ICD-10-CM

## 2021-05-12 DIAGNOSIS — H409 Unspecified glaucoma: Secondary | ICD-10-CM | POA: Diagnosis not present

## 2021-05-12 DIAGNOSIS — R1310 Dysphagia, unspecified: Secondary | ICD-10-CM | POA: Diagnosis present

## 2021-05-12 DIAGNOSIS — Z886 Allergy status to analgesic agent status: Secondary | ICD-10-CM

## 2021-05-12 DIAGNOSIS — Y848 Other medical procedures as the cause of abnormal reaction of the patient, or of later complication, without mention of misadventure at the time of the procedure: Secondary | ICD-10-CM | POA: Diagnosis present

## 2021-05-12 DIAGNOSIS — J302 Other seasonal allergic rhinitis: Secondary | ICD-10-CM | POA: Diagnosis present

## 2021-05-12 DIAGNOSIS — Z87891 Personal history of nicotine dependence: Secondary | ICD-10-CM | POA: Diagnosis not present

## 2021-05-12 DIAGNOSIS — E119 Type 2 diabetes mellitus without complications: Secondary | ICD-10-CM | POA: Diagnosis present

## 2021-05-12 DIAGNOSIS — G4730 Sleep apnea, unspecified: Secondary | ICD-10-CM | POA: Diagnosis present

## 2021-05-12 DIAGNOSIS — Z8249 Family history of ischemic heart disease and other diseases of the circulatory system: Secondary | ICD-10-CM

## 2021-05-12 DIAGNOSIS — Z90710 Acquired absence of both cervix and uterus: Secondary | ICD-10-CM | POA: Diagnosis not present

## 2021-05-12 DIAGNOSIS — Z20822 Contact with and (suspected) exposure to covid-19: Secondary | ICD-10-CM | POA: Diagnosis not present

## 2021-05-12 DIAGNOSIS — E785 Hyperlipidemia, unspecified: Secondary | ICD-10-CM | POA: Diagnosis present

## 2021-05-12 DIAGNOSIS — Z881 Allergy status to other antibiotic agents status: Secondary | ICD-10-CM

## 2021-05-12 LAB — RESP PANEL BY RT-PCR (FLU A&B, COVID) ARPGX2
Influenza A by PCR: NEGATIVE
Influenza B by PCR: NEGATIVE
SARS Coronavirus 2 by RT PCR: NEGATIVE

## 2021-05-12 MED ORDER — LATANOPROST 0.005 % OP SOLN
1.0000 [drp] | Freq: Every day | OPHTHALMIC | Status: DC
  Administered 2021-05-12 – 2021-05-13 (×2): 1 [drp] via OPHTHALMIC
  Filled 2021-05-12: qty 2.5

## 2021-05-12 MED ORDER — ONDANSETRON HCL 4 MG/2ML IJ SOLN: 4.0000 mg | Freq: Four times a day (QID) | INTRAMUSCULAR | Status: DC | PRN

## 2021-05-12 MED ORDER — PANTOPRAZOLE SODIUM 40 MG IV SOLR
40.0000 mg | Freq: Every day | INTRAVENOUS | Status: DC
  Administered 2021-05-12 – 2021-05-13 (×2): 40 mg via INTRAVENOUS
  Filled 2021-05-12 (×2): qty 40

## 2021-05-12 MED ORDER — HYDROMORPHONE HCL 1 MG/ML IJ SOLN: 1.0000 mg | INTRAMUSCULAR | Status: DC | PRN

## 2021-05-12 MED ORDER — NITROGLYCERIN 0.4 MG SL SUBL: 0.4000 mg | SUBLINGUAL_TABLET | SUBLINGUAL | Status: DC | PRN

## 2021-05-12 MED ORDER — LACTATED RINGERS IV SOLN
INTRAVENOUS | Status: DC
  Administered 2021-05-14: 1000 mL via INTRAVENOUS

## 2021-05-12 MED ORDER — VANCOMYCIN HCL IN DEXTROSE 1-5 GM/200ML-% IV SOLN: 1000.0000 mg | INTRAVENOUS | Status: DC

## 2021-05-12 MED ORDER — ONDANSETRON 4 MG PO TBDP: 4.0000 mg | ORAL_TABLET | Freq: Four times a day (QID) | ORAL | Status: DC | PRN

## 2021-05-12 MED ORDER — ACETAMINOPHEN 650 MG RE SUPP
650.0000 mg | Freq: Four times a day (QID) | RECTAL | Status: DC | PRN
Start: 2021-05-12 — End: 2021-05-14

## 2021-05-12 MED ORDER — ACETAMINOPHEN 325 MG PO TABS: 650.0000 mg | ORAL_TABLET | Freq: Four times a day (QID) | ORAL | Status: DC | PRN

## 2021-05-12 MED ORDER — LORAZEPAM 2 MG/ML IJ SOLN: 1.0000 mg | INTRAMUSCULAR | Status: DC | PRN

## 2021-05-12 MED ORDER — AMLODIPINE BESYLATE 5 MG PO TABS
5.0000 mg | ORAL_TABLET | Freq: Every day | ORAL | Status: DC
  Administered 2021-05-12: 5 mg via ORAL
  Filled 2021-05-12: qty 1

## 2021-05-12 MED ORDER — BENAZEPRIL HCL 10 MG PO TABS
10.0000 mg | ORAL_TABLET | Freq: Two times a day (BID) | ORAL | Status: DC
  Administered 2021-05-12: 10 mg via ORAL
  Filled 2021-05-12 (×2): qty 1

## 2021-05-12 NOTE — ED Notes (Signed)
ED TO INPATIENT HANDOFF REPORT  ED Nurse Name and Phone #: [phone]  S Name/Age/Gender Melinda Acevedo 80 y.o. female Room/Bed: APA01/APA01  Code Status   Code Status: Full Code  Home/SNF/Other Home Patient oriented to: self, place, time and situation Is this baseline? Yes   Triage Complete: Triage complete  Chief Complaint Dislodged gastrostomy tube [M54.650P]  Triage Note Pt presents to ED via [place] as transfer from [facility] in [place]. Pt presented to [facility] ER for pain and bleeding around peg tube site which was placed approx 3 weeks ago by Dr Arnoldo Morale for difficulty swallowing. Per radiology report, tube and balloon outside of stomach within the anterior abdominal wall.     Allergies Allergies  Allergen Reactions  . Alendronate     Made bones hurt  . Cinoxacin Hives  . Contrast Media [Iodinated Diagnostic Agents] Diarrhea    Only kidney ID dyes  . Diltiazem     "made her feel like she was in a cloud"  . Keflex [Cephalexin] Hives  . Naproxen Other (See Comments)    Stomach upset  . Nifedipine     Does not recall  . Norfloxacin Hives  . Pravastatin Sodium     Stomach cramps  . Trimethoprim Hives  . Valdecoxib     Lower belly pain    Level of Care/Admitting Diagnosis ED Disposition    ED Disposition Condition [facility]: [facility] [[id number]]  Level of Care: Med-Surg [16]  Covid Evaluation: Asymptomatic Screening Protocol (No Symptoms)  Diagnosis: Dislodged gastrostomy tube [127517]  Admitting Physician: Aviva Signs [phone]  Attending Physician: Aviva Signs [phone]       B Medical/Surgery History Past Medical History:  Diagnosis Date  . Anxiety   . CAD (coronary artery disease)   . Carotid artery occlusion   . Diabetes mellitus without complication ([place])   . Glaucoma   . History of hiatal hernia   . Hyperlipidemia   . Hypertension   . Irregular heart beats   . Seasonal allergies   . Sleep apnea    Past  Surgical History:  Procedure Laterality Date  . ABDOMINAL HYSTERECTOMY    . CATARACT EXTRACTION Bilateral   . CHOLECYSTECTOMY    . COLONOSCOPY  2017   Dr.Williams in [place] , [place]  . COLONOSCOPY WITH PROPOFOL N/A 02/06/2021   Procedure: COLONOSCOPY WITH PROPOFOL;  Surgeon: Harvel Quale, MD;  Location: [facility] ENDO SU[facility];  Service: Gastroenterology;  Laterality: N/A;  10:30  . ESOPHAGOGASTRODUODENOSCOPY (EGD) WITH PROPOFOL N/A 02/06/2021   Procedure: ESOPHAGOGASTRODUODENOSCOPY (EGD) WITH PROPOFOL;  Surgeon: Harvel Quale, MD;  Location: [facility] ENDO SUITE;  Service: Gastroenterology;  Laterality: N/A;  . ESOPHAGOGASTRODUODENOSCOPY (EGD) WITH PROPOFOL N/A 04/17/2021   Procedure: ESOPHAGOGASTRODUODENOSCOPY (EGD) WITH PROPOFOL;  Surgeon: Aviva Signs, MD;  Location: [facility] ORS;  Service: General;  Laterality: N/A;  . heart catherization  [id number], 2010  . PEG PLACEMENT N/A 04/17/2021   Procedure: PERCUTANEOUS ENDOSCOPIC GASTROSTOMY (PEG) PLACEMENT;  Surgeon: Aviva Signs, MD;  Location: [facility] ORS;  Service: General;  Laterality: N/A;  . RECTOCELE REPAIR    . TONSILLECTOMY    . TUBAL LIGATION  1986  . UPPER GASTROINTESTINAL ENDOSCOPY  2015   Dr.Williams in [place], [place]     A IV Location/Drains/Wounds Patient Lines/Drains/Airways Status    Active Line/Drains/Airways    Name Placement date Placement time Site Days   Peripheral IV 05/12/21 20 G Right Forearm 05/12/21  0942  Forearm  less than 1   Gastrostomy/Enterostomy PEG-jejunostomy 20 Fr. LUQ 04/17/21  1044  LUQ  25   Incision (Closed) 04/17/21 Abdomen 04/17/21  1040  -- 25          Intake/Output Last 24 hours No intake or output data in the 24 hours ending 05/12/21 1238  Labs/Imaging Results for orders placed or performed during the hospital encounter of 05/12/21 (from the past 48 hour(s))  Resp Panel by RT-PCR (Flu A&B, Covid) Nasopharyngeal Swab     Status: None   Collection Time:  05/12/21 11:35 AM   Specimen: Nasopharyngeal Swab; Nasopharyngeal(NP) swabs in vial transport medium  Result Value Ref Range   SARS Coronavirus 2 by RT PCR NEGATIVE NEGATIVE    Comment: (NOTE) SARS-CoV-2 target nucleic acids are NOT DETECTED.  The SARS-CoV-2 RNA is generally detectable in upper respiratory specimens during the acute phase of infection. The lowest concentration of SARS-CoV-2 viral copies this assay can detect is 138 copies/mL. A negative result does not preclude SARS-Cov-2 infection and should not be used as the sole basis for treatment or other patient management decisions. A negative result may occur with  improper specimen collection/handling, submission of specimen other than nasopharyngeal swab, presence of viral mutation(s) within the areas targeted by this assay, and inadequate number of viral copies(<138 copies/mL). A negative result must be combined with clinical observations, patient history, and epidemiological information. The expected result is Negative.  Fact Sheet for Patients:  EntrepreneurPulse.com.au  Fact Sheet for Healthcare Providers:  IncredibleEmployment.be  This test is no t yet approved or cleared by the [place] FDA and  has been authorized for detection and/or diagnosis of SARS-CoV-2 by FDA under an Emergency Use Authorization (EUA). This EUA will remain  in effect (meaning this test can be used) for the duration of the COVID-19 declaration under Section 564(b)(1) of the Act, 21 U.S.C.section 360bbb-3(b)(1), unless the authorization is terminated  or revoked sooner.       Influenza A by PCR NEGATIVE NEGATIVE   Influenza B by PCR NEGATIVE NEGATIVE    Comment: (NOTE) The Xpert Xpress SARS-CoV-2/FLU/RSV plus assay is intended as an aid in the diagnosis of influenza from Nasopharyngeal swab specimens and should not be used as a sole basis for treatment. Nasal washings and aspirates are  unacceptable for Xpert Xpress SARS-CoV-2/FLU/RSV testing.  Fact Sheet for Patients: EntrepreneurPulse.com.au  Fact Sheet for Healthcare Providers: IncredibleEmployment.be  This test is not yet approved or cleared by the [place] FDA and has been authorized for detection and/or diagnosis of SARS-CoV-2 by FDA under an Emergency Use Authorization (EUA). This EUA will remain in effect (meaning this test can be used) for the duration of the COVID-19 declaration under Section 564(b)(1) of the Act, 21 U.S.C. section 360bbb-3(b)(1), unless the authorization is terminated or revoked.  Performed at [facility], [address], [place], [place] [address]    No results found.  Pending Labs Unresulted Labs (From admission, onward)         None      Vitals/Pain Today's Vitals   05/12/21 0930 05/12/21 0932 05/12/21 0933  BP: (!) 156/70    Pulse: 84    Resp:   18  Temp:   97.9 F (36.6 C)  TempSrc:   Oral  SpO2: 95%    Weight:  66.2 kg   Height:  5' (1.524 m)   PainSc:  2      Isolation Precautions Airborne and Contact precautions  Medications Medications  amLODipine (  NORVASC) tablet 5 mg (5 mg Oral Given 05/12/21 1113)  benazepril (LOTENSIN) tablet 10 mg (10 mg Oral Given 05/12/21 1113)  nitroGLYCERIN (NITROSTAT) SL tablet 0.4 mg (has no administration in time range)  latanoprost (XALATAN) 0.005 % ophthalmic solution 1 drop (has no administration in time range)  lactated ringers infusion ( Intravenous New Bag/Given 05/12/21 1117)  acetaminophen (TYLENOL) tablet 650 mg (has no administration in time range)    Or  acetaminophen (TYLENOL) suppository 650 mg (has no administration in time range)  HYDROmorphone (DILAUDID) injection 1 mg (has no administration in time range)  ondansetron (ZOFRAN-ODT) disintegrating tablet 4 mg (has no administration in time range)    Or  ondansetron (ZOFRAN) injection 4 mg (has no administration in time  range)  pantoprazole (PROTONIX) injection 40 mg (has no administration in time range)  LORazepam (ATIVAN) injection 1 mg (has no administration in time range)  vancomycin (VANCOCIN) IVPB 1000 mg/200 mL premix (has no administration in time range)    Mobility walks with device Low fall risk   Focused Assessments    R Recommendations: See Admitting Provider Note  Report given to:   Additional Notes:

## 2021-05-12 NOTE — ED Triage Notes (Signed)
Pt presents to ED via [facility] as transfer from [facility] in [place]. Pt presented to [facility] ER for pain and bleeding around peg tube site which was placed approx 3 weeks ago by Dr Arnoldo Morale for difficulty swallowing. Per radiology report, tube and balloon outside of stomach within the anterior abdominal wall.

## 2021-05-12 NOTE — ED Provider Notes (Signed)
[facility] EMERGENCY DEPARTMENT Provider Note   CSN: [id number] Arrival date & time: 05/12/21  4401     History Chief Complaint  Patient presents with  . Peg Tube Displaced    Melinda Acevedo is a 80 y.o. female.  Patient transferred from [facility] emergency department to be seen by Dr. Arnoldo Morale.  Dr. Arnoldo Morale approximately 3-1/2 weeks ago placed a PEG G-tube.  They did a CT scan there that showed that the balloon was outside of the stomach and was in the anterior abdominal wall.  Dr. Arnoldo Morale knows patient is here he will be coming to see her.  No active bleeding at this point in time but clearly based on the CT scan the PEG tube would not be usable.  Patient in no distress at this point in time.        Past Medical History:  Diagnosis Date  . Anxiety   . CAD (coronary artery disease)   . Carotid artery occlusion   . Diabetes mellitus without complication ([place])   . Glaucoma   . History of hiatal hernia   . Hyperlipidemia   . Hypertension   . Irregular heart beats   . Seasonal allergies   . Sleep apnea     Patient Active Problem List   Diagnosis Date Noted  . Dysphagia 04/03/2021  . Anxiety 01/06/2021  . Constipation, unspecified 01/06/2021  . Diverticulitis 01/06/2021  . Fatigue 01/06/2021  . GERD (gastroesophageal reflux disease) 01/06/2021  . Hyperlipidemia 01/06/2021  . Hypertension 01/06/2021  . Hypothyroidism 01/06/2021  . Impaired fasting glucose 01/06/2021  . Osteoporosis 01/06/2021  . Vitamin D deficiency 01/06/2021  . Neck pain 01/01/2021  . History of colonic polyps 01/01/2021  . Obstructive sleep apnea 08/26/2017    Past Surgical History:  Procedure Laterality Date  . ABDOMINAL HYSTERECTOMY    . CATARACT EXTRACTION Bilateral   . CHOLECYSTECTOMY    . COLONOSCOPY  2017   Dr.Williams in [place] , [place]  . COLONOSCOPY WITH PROPOFOL N/A 02/06/2021   Procedure: COLONOSCOPY WITH PROPOFOL;  Surgeon: Harvel Quale, MD;  Location: [facility] ENDO SUITE;   Service: Gastroenterology;  Laterality: N/A;  10:30  . ESOPHAGOGASTRODUODENOSCOPY (EGD) WITH PROPOFOL N/A 02/06/2021   Procedure: ESOPHAGOGASTRODUODENOSCOPY (EGD) WITH PROPOFOL;  Surgeon: Harvel Quale, MD;  Location: [facility] ENDO SUITE;  Service: Gastroenterology;  Laterality: N/A;  . ESOPHAGOGASTRODUODENOSCOPY (EGD) WITH PROPOFOL N/A 04/17/2021   Procedure: ESOPHAGOGASTRODUODENOSCOPY (EGD) WITH PROPOFOL;  Surgeon: Aviva Signs, MD;  Location: [facility] ORS;  Service: General;  Laterality: N/A;  . heart catherization  [id number], 2010  . PEG PLACEMENT N/A 04/17/2021   Procedure: PERCUTANEOUS ENDOSCOPIC GASTROSTOMY (PEG) PLACEMENT;  Surgeon: Aviva Signs, MD;  Location: [facility] ORS;  Service: General;  Laterality: N/A;  . RECTOCELE REPAIR    . TONSILLECTOMY    . TUBAL LIGATION  1986  . UPPER GASTROINTESTINAL ENDOSCOPY  2015   Dr.Williams in [place], [place]  . VAGINAL HYSTERECTOMY  1991     OB History   No obstetric history on file.     Family History  Problem Relation Age of Onset  . COPD Mother   . Heart disease Mother   . Heart disease Father   . Heart attack Father   . Hypertension Sister   . Mitral valve prolapse Brother     Social History   Tobacco Use  . Smoking status: Former Smoker    Years: 2.00    Types: Cigarettes    Quit date: 1964    Years since quitting:  58.4  . Smokeless tobacco: Never Used  Vaping Use  . Vaping Use: Never used  Substance Use Topics  . Alcohol use: No  . Drug use: No    Home Medications Prior to Admission medications   Medication Sig Start Date End Date Taking? Authorizing Provider  amLODipine (NORVASC) 5 MG tablet Take 5 mg by mouth daily.    [provider]  Apple Cider Vinegar 500 MG TABS Take 500 mg by mouth daily.    [provider]  aspirin 81 MG tablet Take 81 mg by mouth daily.    [provider]  benazepril (LOTENSIN) 10 MG tablet Take 10 mg by mouth 2 (two) times daily.    [provider]   Cholecalciferol (VITAMIN D) 125 MCG (5000 UT) CAPS Take 5,000 Units by mouth daily.     [provider]  Coenzyme Q10 (COQ10) 200 MG CAPS Take 200 mg by mouth daily.    [provider]  HYDROcodone-acetaminophen (HYCET) 7.5-325 mg/15 ml solution Take 10 mLs by mouth every 6 (six) hours as needed for moderate pain. 04/17/21 04/17/22  Aviva Signs, MD  loratadine (CLARITIN) 10 MG tablet Take 10 mg by mouth daily.    [provider]  LORazepam (ATIVAN) 0.5 MG tablet Take 0.5 mg by mouth 3 (three) times daily as needed for anxiety.    [provider]  Misc Natural Products (ELDERBERRY ZINC/VIT C/IMMUNE MT) Take 1 tablet by mouth daily.    [provider]  Multiple Vitamins-Minerals (MULTIVITAMIN ADULT PO) Take 2 tablets by mouth daily.    [provider]  nitroGLYCERIN (NITROSTAT) 0.4 MG SL tablet Place 0.4 mg under the tongue every 5 (five) minutes as needed for chest pain.    [provider]  ondansetron (ZOFRAN) 4 MG tablet Take 4 mg by mouth every 8 (eight) hours as needed for vomiting or nausea. 01/19/21   [provider]  predniSONE (DELTASONE) 50 MG tablet Take 1 tablet (50 mg total) by mouth 3 (three) times daily as needed. Take one tablet by mouth at 11pm today, 5am tomorrow, and at 11am tomorrow. 05/07/21   Aviva Signs, MD  RABEprazole (ACIPHEX) 20 MG tablet Take 20 mg by mouth daily.    [provider]  rosuvastatin (CRESTOR) 10 MG tablet Take 10 mg by mouth daily.    [provider]  Travoprost, BAK Free, (TRAVATAN) 0.004 % SOLN ophthalmic solution Place 1 drop into both eyes at bedtime.    [provider]    Allergies    Contrast media [iodinated diagnostic agents], Alendronate, Cinoxacin, Diltiazem, Keflex [cephalexin], Naproxen, Nifedipine, Norfloxacin, Pravastatin sodium, Trimethoprim, and Valdecoxib  Review of Systems   Review of Systems  Constitutional: Negative for chills and fever.   HENT: Negative for rhinorrhea and sore throat.   Eyes: Negative for visual disturbance.  Respiratory: Negative for cough and shortness of breath.   Cardiovascular: Negative for chest pain and leg swelling.  Gastrointestinal: Positive for abdominal pain. Negative for diarrhea, nausea and vomiting.  Genitourinary: Negative for dysuria.  Musculoskeletal: Negative for back pain and neck pain.  Skin: Negative for rash.  Neurological: Negative for dizziness, light-headedness and headaches.  Hematological: Does not bruise/bleed easily.  Psychiatric/Behavioral: Negative for confusion.    Physical Exam Updated Vital Signs BP (!) 156/70   Pulse 84   Temp 97.9 F (36.6 C) (Oral)   Resp 18   Ht 1.524 m (5')   Wt 66.2 kg   SpO2 95%  BMI 28.51 kg/m   Physical Exam Vitals and nursing note reviewed.  Constitutional:      General: She is not in acute distress.    Appearance: Normal appearance. She is well-developed.  HENT:     Head: Normocephalic and atraumatic.  Eyes:     Extraocular Movements: Extraocular movements intact.     Conjunctiva/sclera: Conjunctivae normal.     Pupils: Pupils are equal, round, and reactive to light.  Cardiovascular:     Rate and Rhythm: Normal rate and regular rhythm.     Heart sounds: No murmur heard.   Pulmonary:     Effort: Pulmonary effort is normal. No respiratory distress.     Breath sounds: Normal breath sounds.  Abdominal:     Palpations: Abdomen is soft.     Tenderness: There is abdominal tenderness.     Comments: Some mild tenderness around the PEG tube.  There is also some mucousy type discharge.  No erythema.  Musculoskeletal:     Cervical back: Normal range of motion and neck supple.  Skin:    General: Skin is warm and dry.  Neurological:     General: No focal deficit present.     Mental Status: She is alert and oriented to person, place, and time.     ED Results / Procedures / Treatments   Labs (all labs ordered are listed, but  only abnormal results are displayed) Labs Reviewed - No data to display  EKG None  Radiology No results found.  Procedures Procedures   Medications Ordered in ED Medications - No data to display  ED Course  I have reviewed the triage vital signs and the nursing notes.  Pertinent labs & imaging results that were available during my care of the patient were reviewed by me and considered in my medical decision making (see chart for details).    MDM Rules/Calculators/A&P                          Dr. Arnoldo Morale will be in to see patient for the dislodged PEG tube.  Is actually the balloon is in the abdominal wall.  Patient in no acute distress.  Patient has IV in place.   Final Clinical Impression(s) / ED Diagnoses Final diagnoses:  Dislodged gastrostomy tube    Rx / DC Orders ED Discharge Orders    None       Fredia Sorrow, MD 05/12/21 [phone]

## 2021-05-12 NOTE — H&P (Signed)
Melinda Acevedo is an 79 y.o. female.   Chief Complaint: Dislodgment of PEG tube HPI: Patient is a 80 year old white female status post EGD with PEG on 04/17/2021 who came to my office last week with difficulty flushing the PEG tube.  The patient underwent fluoroscopic evaluation on 05/08/2021 by interventional radiology which confirmed that the tube was in the appropriate position.  Yesterday, the patient could not flush the gastrostomy tube.  She went to [facility] emergency room and a CT scan was performed which revealed the bulk to be in the subcutaneous tissue.  She was transferred to the emergency room here for further evaluation and treatment.  Past Medical History:  Diagnosis Date  . Anxiety   . CAD (coronary artery disease)   . Carotid artery occlusion   . Diabetes mellitus without complication ([place])   . Glaucoma   . History of hiatal hernia   . Hyperlipidemia   . Hypertension   . Irregular heart beats   . Seasonal allergies   . Sleep apnea     Past Surgical History:  Procedure Laterality Date  . ABDOMINAL HYSTERECTOMY    . CATARACT EXTRACTION Bilateral   . CHOLECYSTECTOMY    . COLONOSCOPY  2017   Dr.Williams in [place] , [place]  . COLONOSCOPY WITH PROPOFOL N/A 02/06/2021   Procedure: COLONOSCOPY WITH PROPOFOL;  Surgeon: Harvel Quale, MD;  Location: [facility] END[facility];  Service: Gastroenterology;  Laterality: N/A;  10:30  . ESOPHAGOGASTRODUODENOSCOPY (EGD) WITH PROPOFOL N/A 02/06/2021   Procedure: ESOPHAGOGASTRODUODENOSCOPY (EGD) WITH PROPOFOL;  Surgeon: Harvel Quale, MD;  Location: [facility] END[facility];  Service: Gastroenterology;  Laterality: N/A;  . ESOPHAGOGASTRODUODENOSCOPY (EGD) WITH PROPOFOL N/A 04/17/2021   Procedure: ESOPHAGOGASTRODUODENOSCOPY (EGD) WITH PROPOFOL;  Surgeon: Aviva Signs, MD;  Location: [facility] ORS;  Service: General;  Laterality: N/A;  . heart catherization  [id number], 2010  . PEG PLACEMENT N/A 04/17/2021   Procedure: PERCUTANEOUS ENDOSCOPIC  GASTROSTOMY (PEG) PLACEMENT;  Surgeon: Aviva Signs, MD;  Location: [facility] ORS;  Service: General;  Laterality: N/A;  . RECTOCELE REPAIR    . TONSILLECTOMY    . TUBAL LIGATION  1986  . UPPER GASTROINTESTINAL ENDOSCOPY  2015   Dr.Williams in [place],   . VAGINAL HYSTERECTOMY  1991    Family History  Problem Relation Age of Onset  . COPD Mother   . Heart disease Mother   . Heart disease Father   . Heart attack Father   . Hypertension Sister   . Mitral valve prolapse Brother    Social History:  reports that she quit smoking about 58 years ago. Her smoking use included cigarettes. She quit after 2.00 years of use. She has never used smokeless tobacco. She reports that she does not drink alcohol and does not use drugs.  Allergies:  Allergies  Allergen Reactions  . Contrast Media [Iodinated Diagnostic Agents] Shortness Of Breath  . Alendronate     Made bones hurt  . Cinoxacin Hives  . Diltiazem     "made her feel like she was in a cloud"  . Keflex [Cephalexin] Hives  . Naproxen Other (See Comments)    Stomach upset  . Nifedipine     Does not recall  . Norfloxacin Hives  . Pravastatin Sodium     Stomach cramps  . Trimethoprim Hives  . Valdecoxib     Lower belly pain    (Not in a hospital admission)   No results found for this or any previous visit (from the past 48 hour(s)). No  results found.  Review of Systems  Constitutional: Positive for appetite change.  HENT: Negative.   Eyes: Negative.   Respiratory: Negative.   Gastrointestinal: Positive for abdominal pain.  Endocrine: Negative.   Genitourinary: Negative.   Musculoskeletal: Negative.   Skin: Negative.   Allergic/Immunologic: Negative.   Neurological: Negative.   Hematological: Negative.   Psychiatric/Behavioral: Negative.     Blood pressure (!) 156/70, pulse 84, temperature 97.9 F (36.6 C), temperature source Oral, resp. rate 18, height 5' (1.524 m), weight 66.2 kg, SpO2 95 %. Physical Exam Vitals  reviewed.  Constitutional:      Appearance: Normal appearance. She is not ill-appearing.  HENT:     Head: Normocephalic and atraumatic.  Cardiovascular:     Rate and Rhythm: Normal rate and regular rhythm.     Heart sounds: Normal heart sounds. No murmur heard. No friction rub. No gallop.   Pulmonary:     Effort: Pulmonary effort is normal. No respiratory distress.     Breath sounds: Normal breath sounds. No stridor. No wheezing, rhonchi or rales.  Abdominal:     General: There is no distension.     Palpations: Abdomen is soft. There is no mass.     Tenderness: There is no abdominal tenderness. There is no guarding.     Hernia: No hernia is present.     Comments: Gastrostomy tube in the left upper quadrant with bulb palpated in the subcutaneous tissue.  The bolster is at the 2 cm Yashua Bracco.  No rigidity is noted.  Skin:    General: Skin is warm and dry.  Neurological:     Mental Status: She is alert and oriented to person, place, and time.     CT report from [facility] reviewed Assessment/Plan Impression: Dislodgment of gastrostomy tube Plan: Patient will be admitted to the hospital for IV hydration and pretreatment with steroids for subsequent reevaluation and replacement of the gastrostomy tube by interventional radiology.  We will start Ancef.  The treatment plan has been explained to the patient who understands and agrees.  Aviva Signs, MD 05/12/2021, 10:34 AM

## 2021-05-12 NOTE — ED Notes (Signed)
Attempted report x 1, NA

## 2021-05-13 ENCOUNTER — Ambulatory Visit (HOSPITAL_COMMUNITY)
Admit: 2021-05-13 | Discharge: 2021-05-13 | Disposition: A | Discharge status: Home / Self Care | Payer: Medicare Other | Attending: General Surgery | Admitting: General Surgery

## 2021-05-13 DIAGNOSIS — Z8249 Family history of ischemic heart disease and other diseases of the circulatory system: Secondary | ICD-10-CM | POA: Diagnosis not present

## 2021-05-13 DIAGNOSIS — Z91041 Radiographic dye allergy status: Secondary | ICD-10-CM | POA: Diagnosis not present

## 2021-05-13 DIAGNOSIS — Z7982 Long term (current) use of aspirin: Secondary | ICD-10-CM | POA: Diagnosis not present

## 2021-05-13 DIAGNOSIS — Z881 Allergy status to other antibiotic agents status: Secondary | ICD-10-CM | POA: Diagnosis not present

## 2021-05-13 DIAGNOSIS — Z20822 Contact with and (suspected) exposure to covid-19: Secondary | ICD-10-CM | POA: Diagnosis present

## 2021-05-13 DIAGNOSIS — T85528A Displacement of other gastrointestinal prosthetic devices, implants and grafts, initial encounter: Secondary | ICD-10-CM | POA: Diagnosis not present

## 2021-05-13 DIAGNOSIS — Z886 Allergy status to analgesic agent status: Secondary | ICD-10-CM | POA: Diagnosis not present

## 2021-05-13 DIAGNOSIS — F419 Anxiety disorder, unspecified: Secondary | ICD-10-CM | POA: Diagnosis present

## 2021-05-13 DIAGNOSIS — R1310 Dysphagia, unspecified: Secondary | ICD-10-CM | POA: Diagnosis present

## 2021-05-13 DIAGNOSIS — Z87891 Personal history of nicotine dependence: Secondary | ICD-10-CM | POA: Diagnosis not present

## 2021-05-13 DIAGNOSIS — I2510 Atherosclerotic heart disease of native coronary artery without angina pectoris: Secondary | ICD-10-CM | POA: Diagnosis present

## 2021-05-13 DIAGNOSIS — E119 Type 2 diabetes mellitus without complications: Secondary | ICD-10-CM | POA: Diagnosis present

## 2021-05-13 DIAGNOSIS — K9423 Gastrostomy malfunction: Secondary | ICD-10-CM | POA: Diagnosis present

## 2021-05-13 DIAGNOSIS — Z888 Allergy status to other drugs, medicaments and biological substances status: Secondary | ICD-10-CM | POA: Diagnosis not present

## 2021-05-13 DIAGNOSIS — G4730 Sleep apnea, unspecified: Secondary | ICD-10-CM | POA: Diagnosis present

## 2021-05-13 DIAGNOSIS — Z90710 Acquired absence of both cervix and uterus: Secondary | ICD-10-CM | POA: Diagnosis not present

## 2021-05-13 DIAGNOSIS — E785 Hyperlipidemia, unspecified: Secondary | ICD-10-CM | POA: Diagnosis present

## 2021-05-13 DIAGNOSIS — H409 Unspecified glaucoma: Secondary | ICD-10-CM | POA: Diagnosis present

## 2021-05-13 DIAGNOSIS — J302 Other seasonal allergic rhinitis: Secondary | ICD-10-CM | POA: Diagnosis present

## 2021-05-13 DIAGNOSIS — Z79899 Other long term (current) drug therapy: Secondary | ICD-10-CM | POA: Diagnosis not present

## 2021-05-13 DIAGNOSIS — I10 Essential (primary) hypertension: Secondary | ICD-10-CM | POA: Diagnosis present

## 2021-05-13 DIAGNOSIS — Y848 Other medical procedures as the cause of abnormal reaction of the patient, or of later complication, without mention of misadventure at the time of the procedure: Secondary | ICD-10-CM | POA: Diagnosis present

## 2021-05-13 HISTORY — PX: IR GASTROSTOMY TUBE REMOVAL: IMG5492

## 2021-05-13 LAB — CBC
HCT: 39.9 % (ref 36.0–46.0)
Hemoglobin: 12.4 g/dL (ref 12.0–15.0)
MCH: 30.8 pg (ref 26.0–34.0)
MCHC: 31.1 g/dL (ref 30.0–36.0)
MCV: 99 fL (ref 80.0–100.0)
Platelets: 282 10*3/uL (ref 150–400)
RBC: 4.03 MIL/uL (ref 3.87–5.11)
RDW: 14 % (ref 11.5–15.5)
WBC: 11.1 10*3/uL — ABNORMAL HIGH (ref 4.0–10.5)
nRBC: 0 % (ref 0.0–0.2)

## 2021-05-13 LAB — BASIC METABOLIC PANEL
Anion gap: 9 (ref 5–15)
BUN: 8 mg/dL (ref 8–23)
CO2: 29 mmol/L (ref 22–32)
Calcium: 8.9 mg/dL (ref 8.9–10.3)
Chloride: 102 mmol/L (ref 98–111)
Creatinine, Ser: 0.54 mg/dL (ref 0.44–1.00)
GFR, Estimated: >60 mL/min (ref >60)
Glucose, Bld: 88 mg/dL (ref 70–99)
Potassium: 3.7 mmol/L (ref 3.5–5.1)
Sodium: 140 mmol/L (ref 135–145)

## 2021-05-13 MED ORDER — VANCOMYCIN HCL IN DEXTROSE 1-5 GM/200ML-% IV SOLN
1000.0000 mg | INTRAVENOUS | Status: AC
  Administered 2021-05-14: 1000 mg via INTRAVENOUS
  Filled 2021-05-13: qty 200

## 2021-05-13 MED ORDER — DIPHENHYDRAMINE HCL 50 MG/ML IJ SOLN
25.0000 mg | Freq: Once | INTRAMUSCULAR | Status: AC
  Administered 2021-05-13: 25 mg via INTRAVENOUS
  Filled 2021-05-13: qty 1

## 2021-05-13 MED ORDER — LIDOCAINE VISCOUS HCL 2 % MT SOLN
OROMUCOSAL | Status: AC
  Administered 2021-05-13: 10 mL
  Filled 2021-05-13: qty 15

## 2021-05-13 MED ORDER — IOHEXOL 300 MG/ML  SOLN
10.0000 mL | Freq: Once | INTRAMUSCULAR | Status: AC | PRN
  Administered 2021-05-13: 5 mL

## 2021-05-13 NOTE — Procedures (Signed)
Interventional Radiology Procedure:   Indications:  Gastrostomy tube malfunction, located in subcutaneous tissues based on outside CT.  Procedure: Gastrostomy tube injection and attempted replacement  Findings: Gastrostomy tube within subcutaneous tissues and not communicating with stomach.  Old tube was completely removed.  Unable to cannulate the stomach from old tract.  Could not replace the gastrostomy tube.   Complications: None     EBL: less than 10 mo  Plan: Discussed with Dr. Arnoldo Morale and plan for new PEG with Dr. Arnoldo Morale at [facility].    Melinda Curless R. Anselm Pancoast, MD  Pager: [phone]

## 2021-05-13 NOTE — Progress Notes (Signed)
  Subjective: No complaints of pain.  Objective: Vital signs in last 24 hours: Temp:  [97.2 F (36.2 C)-98.4 F (36.9 C)] 98.4 F (36.9 C) (05/25 1127) Pulse Rate:  [64-75] 75 (05/25 1127) Resp:  [16-19] 18 (05/25 1127) BP: (120-149)/(50-65) 149/65 (05/25 1127) SpO2:  [93 %-99 %] 97 % (05/25 1127) Last BM Date: 05/12/21  Intake/Output from previous day: 05/24 0701 - 05/25 0700 In: 358 [I.V.:358] Out: -  Intake/Output this shift: No intake/output data recorded.  General appearance: alert, cooperative and no distress GI: Soft, PEG tube removed.  Lab Results:  No results for input(s): WBC, HGB, HCT, PLT in the last 72 hours. BMET No results for input(s): NA, K, CL, CO2, GLUCOSE, BUN, CREATININE, CALCIUM in the last 72 hours. PT/INR No results for input(s): LABPROT, INR in the last 72 hours.  Studies/Results: IR GASTROSTOMY TUBE REMOVAL/REPAIR  Result Date: 05/13/2021 INDICATION: 80 year old with gastrostomy tube dysfunction. Reportedly, the gastrostomy tube is within subcutaneous tissues based on outside CT examination. EXAM: GASTROSTOMY TUBE INJECTION AND ATTEMPTED REPLACEMENT MEDICATIONS: Viscous lidocaine ANESTHESIA/SEDATION: None CONTRAST:  5 mL Omnipaque 300 FLUOROSCOPY TIME:  Fluoroscopy Time: 5 minutes, 42 seconds, 40 mGy COMPLICATIONS: None immediate. PROCEDURE: Informed written consent was obtained from the patient after a thorough discussion of the procedural risks, benefits and alternatives. All questions were addressed. A timeout was performed prior to the initiation of the procedure. Examination of the gastrostomy tube demonstrates that the tube is not freely moving within the abdomen. Findings are compatible with the tube stuck within the subcutaneous tissues. Contrast injection demonstrates contrast pooling around the end of the gastrostomy tube and not communicating with the stomach. A wire would not advance into the stomach. As a result, the gastrostomy tube was  completely removed with traction. Attempted to advance a 5 French catheter and wire into the stomach through the old tract but this was unsuccessful with fluoroscopy. FINDINGS: Gastrostomy tube was not communicating with the stomach. Old gastrostomy tube was completely removed. Unable to cannulate the stomach through the old tube tract. Unable to place a new gastrostomy tube. IMPRESSION: 1. Gastrostomy tube was dislodged within the subcutaneous tissues. Old gastrostomy tube was completely removed. 2. Unable to replace the gastrostomy tube through the old tube tract. Findings discussed with Dr. Arnoldo Morale. Electronically Signed   By: Markus Daft M.D.   On: 05/13/2021 11:16    Anti-infectives: Anti-infectives (From admission, onward)   Start     Dose/Rate Route Frequency Ordered Stop   05/12/21 1049  vancomycin (VANCOCIN) IVPB 1000 mg/200 mL premix        1,000 mg 200 mL/hr over 60 Minutes Intravenous 30 min pre-op 05/12/21 1050        Assessment/Plan: s/p Procedure(s): PERCUTANEOUS ENDOSCOPIC GASTROSTOMY (PEG) PLACEMENT Impression: IR unable to replace gastrostomy tube.  Patient is scheduled for EGD with PEG tomorrow.  The risks and benefits of the procedure including bleeding, infection, and bowel injury were fully explained to the patient, who gave informed consent.  LOS: 0 days    Aviva Signs 05/13/2021

## 2021-05-13 NOTE — Anesthesia Preprocedure Evaluation (Addendum)
Anesthesia Evaluation  Patient identified by MRN, date of birth, ID band Patient awake    Reviewed: Allergy & Precautions, NPO status , Patient's Chart, lab work & pertinent test results  Airway Mallampati: II  TM Distance: >3 FB Neck ROM: Full    Dental  (+) Dental Advisory Given, Teeth Intact   Pulmonary sleep apnea , former smoker,    Pulmonary exam normal breath sounds clear to auscultation       Cardiovascular Exercise Tolerance: Good hypertension, Pt. on medications + CAD and + Cardiac Stents  Normal cardiovascular exam Rhythm:Regular Rate:Normal     Neuro/Psych Anxiety    GI/Hepatic hiatal hernia, GERD  Medicated and Controlled,  Endo/Other  diabetes, Well Controlled, Type 2Hypothyroidism   Renal/GU      Musculoskeletal negative musculoskeletal ROS (+)   Abdominal   Peds  Hematology   Anesthesia Other Findings   Reproductive/Obstetrics negative OB ROS                             Anesthesia Physical  Anesthesia Plan  ASA: III  Anesthesia Plan: General   Post-op Pain Management:    Induction: Intravenous  PONV Risk Score and Plan: TIVA  Airway Management Planned: Nasal Cannula and Natural Airway  Additional Equipment:   Intra-op Plan:   Post-operative Plan:   Informed Consent: I have reviewed the patients History and Physical, chart, labs and discussed the procedure including the risks, benefits and alternatives for the proposed anesthesia with the patient or authorized representative who has indicated his/her understanding and acceptance.     Dental advisory given  Plan Discussed with: CRNA and Surgeon  Anesthesia Plan Comments:         Anesthesia Quick Evaluation

## 2021-05-13 NOTE — Progress Notes (Signed)
25 mg IV Benedryl given due to allergy to contrast media.

## 2021-05-13 NOTE — H&P (View-Only) (Signed)
  Subjective: No complaints of pain.  Objective: Vital signs in last 24 hours: Temp:  [97.2 F (36.2 C)-98.4 F (36.9 C)] 98.4 F (36.9 C) (05/25 1127) Pulse Rate:  [64-75] 75 (05/25 1127) Resp:  [16-19] 18 (05/25 1127) BP: (120-149)/(50-65) 149/65 (05/25 1127) SpO2:  [93 %-99 %] 97 % (05/25 1127) Last BM Date: 05/12/21  Intake/Output from previous day: 05/24 0701 - 05/25 0700 In: 358 [I.V.:358] Out: -  Intake/Output this shift: No intake/output data recorded.  General appearance: alert, cooperative and no distress GI: Soft, PEG tube removed.  Lab Results:  No results for input(s): WBC, HGB, HCT, PLT in the last 72 hours. BMET No results for input(s): NA, K, CL, CO2, GLUCOSE, BUN, CREATININE, CALCIUM in the last 72 hours. PT/INR No results for input(s): LABPROT, INR in the last 72 hours.  Studies/Results: IR GASTROSTOMY TUBE REMOVAL/REPAIR  Result Date: 05/13/2021 INDICATION: 80 year old with gastrostomy tube dysfunction. Reportedly, the gastrostomy tube is within subcutaneous tissues based on outside CT examination. EXAM: GASTROSTOMY TUBE INJECTION AND ATTEMPTED REPLACEMENT MEDICATIONS: Viscous lidocaine ANESTHESIA/SEDATION: None CONTRAST:  5 mL Omnipaque 300 FLUOROSCOPY TIME:  Fluoroscopy Time: 5 minutes, 42 seconds, 40 mGy COMPLICATIONS: None immediate. PROCEDURE: Informed written consent was obtained from the patient after a thorough discussion of the procedural risks, benefits and alternatives. All questions were addressed. A timeout was performed prior to the initiation of the procedure. Examination of the gastrostomy tube demonstrates that the tube is not freely moving within the abdomen. Findings are compatible with the tube stuck within the subcutaneous tissues. Contrast injection demonstrates contrast pooling around the end of the gastrostomy tube and not communicating with the stomach. A wire would not advance into the stomach. As a result, the gastrostomy tube was  completely removed with traction. Attempted to advance a 5 French catheter and wire into the stomach through the old tract but this was unsuccessful with fluoroscopy. FINDINGS: Gastrostomy tube was not communicating with the stomach. Old gastrostomy tube was completely removed. Unable to cannulate the stomach through the old tube tract. Unable to place a new gastrostomy tube. IMPRESSION: 1. Gastrostomy tube was dislodged within the subcutaneous tissues. Old gastrostomy tube was completely removed. 2. Unable to replace the gastrostomy tube through the old tube tract. Findings discussed with Dr. Arnoldo Morale. Electronically Signed   By: Markus Daft M.D.   On: 05/13/2021 11:16    Anti-infectives: Anti-infectives (From admission, onward)   Start     Dose/Rate Route Frequency Ordered Stop   05/12/21 1049  vancomycin (VANCOCIN) IVPB 1000 mg/200 mL premix        1,000 mg 200 mL/hr over 60 Minutes Intravenous 30 min pre-op 05/12/21 1050        Assessment/Plan: s/p Procedure(s): PERCUTANEOUS ENDOSCOPIC GASTROSTOMY (PEG) PLACEMENT Impression: IR unable to replace gastrostomy tube.  Patient is scheduled for EGD with PEG tomorrow.  The risks and benefits of the procedure including bleeding, infection, and bowel injury were fully explained to the patient, who gave informed consent.  LOS: 0 days    Aviva Signs 05/13/2021

## 2021-05-13 NOTE — Consult Note (Addendum)
Chief Complaint: Patient was seen in consultation today for gastric tube exchange/replacement Chief Complaint  Patient presents with  . Peg Tube Displaced   at the request of Dr Mickeal Needy   Supervising Physician: Daryll Brod  Patient Status: [facility] IP  History of Present Illness: Melinda Acevedo is a 80 y.o. female   Hx sleep apnea, hypertension, hyperlipidemia, diabetes mellitus type 2, coronary artery disease. Presents to [facility] ED 04/03/21 with complains of dysphagia.  Patient reports it started about 3 days ago.  At first she has had the sensation that she had to swallow constantly.  Today became acutely worse where she felt like she could not initiate a swallow. Post evaluation--- was deemed appropriate for PEG per MD  OR Dr Aviva Signs placed PEG 04/17/21 Was seen in office for difficulty flushing tube 5/20 IR performed evaluation of G tube 5/20: Under fluoroscopy, the existing recently placed endoscopic gastrostomy was injected with contrast confirming position in the stomach. This was secured at the skin site with the external bumper which now prevents leakage.  5/23- pt again noted flushing difficulty She went to [facility] emergency room and a CT scan was performed which revealed the bulk to be in the subcutaneous tissue.  She was transferred to the emergency room [facility] for further evaluation and treatment.  Dr Arnoldo Morale has admitted pt to [facility] Requesting IR to exchange vs replace Gastric tube Pt is scheduled at [facility] IR today at 1000 am [facility] to have pt to IR by 9 am   Past Medical History:  Diagnosis Date  . Anxiety   . CAD (coronary artery disease)   . Carotid artery occlusion   . Diabetes mellitus without complication ([place])   . Glaucoma   . History of hiatal hernia   . Hyperlipidemia   . Hypertension   . Irregular heart beats   . Seasonal allergies   . Sleep apnea     Past Surgical History:  Procedure Laterality Date  . ABDOMINAL HYSTERECTOMY    . CATARACT  EXTRACTION Bilateral   . CHOLECYSTECTOMY    . COLONOSCOPY  2017   Dr.Williams in [place] , [place]  . COLONOSCOPY WITH PROPOFOL N/A 02/06/2021   Procedure: COLONOSCOPY WITH PROPOFOL;  Surgeon: Harvel Quale, MD;  Location: [facility] ENDO SUITE;  Service: Gastroenterology;  Laterality: N/A;  10:30  . ESOPHAGOGASTRODUODENOSCOPY (EGD) WITH PROPOFOL N/A 02/06/2021   Procedure: ESOPHAGOGASTRODUODENOSCOPY (EGD) WITH PROPOFOL;  Surgeon: Harvel Quale, MD;  Location: [facility] ENDO SUITE;  Service: Gastroenterology;  Laterality: N/A;  . ESOPHAGOGASTRODUODENOSCOPY (EGD) WITH PROPOFOL N/A 04/17/2021   Procedure: ESOPHAGOGASTRODUODENOSCOPY (EGD) WITH PROPOFOL;  Surgeon: Aviva Signs, MD;  Location: [facility] ORS;  Service: General;  Laterality: N/A;  . heart catherization  [id number], 2010  . PEG PLACEMENT N/A 04/17/2021   Procedure: PERCUTANEOUS ENDOSCOPIC GASTROSTOMY (PEG) PLACEMENT;  Surgeon: Aviva Signs, MD;  Location: [facility] ORS;  Service: General;  Laterality: N/A;  . RECTOCELE REPAIR    . TONSILLECTOMY    . TUBAL LIGATION  1986  . UPPER GASTROINTESTINAL ENDOSCOPY  2015   Dr.Williams in [place], [place]  . VAGINAL HYSTERECTOMY  1991    Allergies: Alendronate, Cinoxacin, Contrast media [iodinated diagnostic agents], Diltiazem, Keflex [cephalexin], Naproxen, Nifedipine, Norfloxacin, Pravastatin sodium, Trimethoprim, and Valdecoxib  Medications: Prior to Admission medications   Medication Sig Start Date End Date Taking? Authorizing Provider  amLODipine (NORVASC) 5 MG tablet Take 5 mg by mouth daily.   Yes [provider]  aspirin 81 MG tablet Take 81 mg  by mouth daily.   Yes [provider]  benazepril (LOTENSIN) 10 MG tablet Take 10 mg by mouth 2 (two) times daily.   Yes [provider]  Cholecalciferol (VITAMIN D) 125 MCG (5000 UT) CAPS Take 5,000 Units by mouth daily.    Yes [provider]  loratadine (CLARITIN) 10 MG tablet Take 10 mg by mouth daily.   Yes  [provider]  LORazepam (ATIVAN) 0.5 MG tablet Take 0.5 mg by mouth 3 (three) times daily as needed for anxiety.   Yes [provider]  ondansetron (ZOFRAN) 4 MG tablet Take 4 mg by mouth every 8 (eight) hours as needed for vomiting or nausea. 01/19/21  Yes [provider]  RABEprazole (ACIPHEX) 20 MG tablet Take 20 mg by mouth daily.   Yes [provider]  rosuvastatin (CRESTOR) 10 MG tablet Take 10 mg by mouth daily.   Yes [provider]  Travoprost, BAK Free, (TRAVATAN) 0.004 % SOLN ophthalmic solution Place 1 drop into both eyes at bedtime.   Yes [provider]  HYDROcodone-acetaminophen (HYCET) 7.5-325 mg/15 ml solution Take 10 mLs by mouth every 6 (six) hours as needed for moderate pain. Patient not taking: Reported on 05/12/2021 04/17/21 04/17/22  Aviva Signs, MD  nitroGLYCERIN (NITROSTAT) 0.4 MG SL tablet Place 0.4 mg under the tongue every 5 (five) minutes as needed for chest pain.    [provider]  predniSONE (DELTASONE) 50 MG tablet Take 1 tablet (50 mg total) by mouth 3 (three) times daily as needed. Take one tablet by mouth at 11pm today, 5am tomorrow, and at 11am tomorrow. Patient not taking: Reported on 05/12/2021 05/07/21   Aviva Signs, MD     Family History  Problem Relation Age of Onset  . COPD Mother   . Heart disease Mother   . Heart disease Father   . Heart attack Father   . Hypertension Sister   . Mitral valve prolapse Brother     Social History   Socioeconomic History  . Marital status: Married    Spouse name: Not on file  . Number of children: Not on file  . Years of education: Not on file  . Highest education level: Not on file  Occupational History  . Not on file  Tobacco Use  . Smoking status: Former Smoker    Years: 2.00    Types: Cigarettes    Quit date: 1964    Years since quitting: 58.4  . Smokeless tobacco: Never Used  Vaping Use  . Vaping Use: Never used  Substance and Sexual  Activity  . Alcohol use: No  . Drug use: No  . Sexual activity: Not on file  Other Topics Concern  . Not on file  Social History Narrative  . Not on file   Social Determinants of Health   Financial Resource Strain: Not on file  Food Insecurity: Not on file  Transportation Needs: Not on file  Physical Activity: Not on file  Stress: Not on file  Social Connections: Not on file     Review of Systems: A 12 point ROS discussed and pertinent positives are indicated in the HPI above.  All other systems are negative.   Vital Signs: BP (!) 126/55 (BP Location: Left Arm)   Pulse 64   Temp (!) 97.2 F (36.2 C)   Resp 16   Ht 5' (1.524 m)   Wt 146 lb (66.2 kg)   SpO2 96%   BMI 28.51 kg/m  Physical Exam HENT:     Mouth/Throat:     Mouth: Mucous membranes are moist.  Cardiovascular:     Rate and Rhythm: Normal rate and regular rhythm.     Heart sounds: Normal heart sounds.  Pulmonary:     Breath sounds: Normal breath sounds.  Abdominal:     Palpations: Abdomen is soft.  Musculoskeletal:        General: Normal range of motion.  Skin:    General: Skin is warm.     Comments: Site of existing G tube is clean and dry NT no bleeding No leakage G tube is in place---   Neurological:     Mental Status: She is alert and oriented to person, place, and time.  Psychiatric:        Behavior: Behavior normal.     Imaging: DG Cm Inj Any Colonic Tube W/Fluoro  Result Date: 05/08/2021 INDICATION: Leaking gastrostomy EXAM: FLUOROSCOPIC INJECTION RECENTLY PLACED GASTROSTOMY MEDICATIONS: NONE. ANESTHESIA/SEDATION: Moderate Sedation Time:  None. The patient was continuously monitored during the procedure by the interventional radiology nurse under my direct supervision. CONTRAST:  20 cc-administered into the gastric lumen. FLUOROSCOPY TIME:  Fluoroscopy Time: 0 minutes 6 seconds (1 mGy). COMPLICATIONS: None immediate. PROCEDURE: Informed written consent was obtained from the patient after  a thorough discussion of the procedural risks, benefits and alternatives. All questions were addressed. Maximal Sterile Barrier Technique was utilized including caps, mask, sterile gowns, sterile gloves, sterile drape, hand hygiene and skin antiseptic. A timeout was performed prior to the initiation of the procedure. Under fluoroscopy, the existing recently placed endoscopic gastrostomy was injected with contrast confirming position in the stomach. This was secured at the skin site with the external bumper which now prevents leakage. Images obtained for documentation. Feeding tube widely patent. IMPRESSION: Gastrostomy confirmed in the stomach and widely patent. Electronically Signed   By: Jerilynn Mages.  Shick M.D.   On: 05/08/2021 14:39    Labs:  CBC: Recent Labs    04/02/21 2203 04/03/21 0407 04/10/21 1521 04/23/21 0146  WBC 11.3* 9.9 10.7* 15.9*  HGB 12.9 13.0 13.2 12.4  HCT 40.5 41.3 41.8 38.0  PLT 345 337 319 334    COAGS: Recent Labs    04/02/21 2203  INR 1.0  APTT 28    BMP: Recent Labs    04/02/21 2203 04/03/21 0407 04/10/21 1901 04/23/21 0146  NA 141 142 142 134*  K 3.6 3.4* 3.3* 3.4*  CL 102 102 107 94*  CO2 26 27 24 30   GLUCOSE 140* 121* 83 168*  BUN 18 17 19 13   CALCIUM 9.8 9.6 8.6* 9.2  CREATININE 0.70 0.64 0.56 0.54  GFRNONAA >60 >60 >60 >60    LIVER FUNCTION TESTS: Recent Labs    04/02/21 2203 04/03/21 0407 04/23/21 0146  BILITOT 0.5 0.6 0.5  AST 25 25 34  ALT 23 20 48*  ALKPHOS 65 61 67  PROT 8.6* 8.1 7.6  ALBUMIN 4.6 4.3 3.3*    TUMOR MARKERS: No results for input(s): AFPTM, CEA, CA199, CHROMGRNA in the last 8760 hours.  Assessment and Plan:  Diagnosis of dysphagia April 2022 PEG was placed in OR with Dr Arnoldo Morale 04/17/21 Difficulty flushing and was seen in IR 5/20--- evaluation revealed G tube in good position and outer disc was cinched to skin- no leakage Now with continued difficulty of use CT at [facility] revealing G tube now in  Subcutaneous tissue per Dr Arnoldo Morale notes Scheduled now for G tube exchange vs replacement Pt  is aware of procedure risks and benefits including but not limited To Infection; bleeding; damage to surrounding structures She is agreeable to proceed Consent signed and in packet to go to [facility] IR this am  Thank you for this interesting consult.  I greatly enjoyed meeting Melinda Acevedo and look forward to participating in their care.  A copy of this report was sent to the requesting provider on this date.  Electronically Signed: Lavonia Drafts, PA-C 05/13/2021, 7:32 AM   I spent a total of 20 Minutes    in face to face in clinical consultation, greater than 50% of which was counseling/coordinating care for PEG exchange vs replacement

## 2021-05-13 NOTE — Progress Notes (Signed)
Notified [facility] of patient appointment at [facility] long IR at 1000.  [facility] aware and placed patient on their am list.

## 2021-05-13 NOTE — Care Management Obs Status (Signed)
[place] NOTIFICATION   Patient Details  Name: Melinda Acevedo MRN: [id number] Date of Birth: [birth year]   Medicare Observation Status Notification Given:  Yes    Tommy Medal 05/13/2021, 2:44 PM

## 2021-05-14 ENCOUNTER — Inpatient Hospital Stay (HOSPITAL_COMMUNITY): Payer: Medicare Other | Admitting: Anesthesiology

## 2021-05-14 ENCOUNTER — Encounter (HOSPITAL_COMMUNITY)
Admission: EM | Disposition: A | Discharge status: Home / Self Care | Payer: Self-pay | Source: Home / Self Care | Attending: General Surgery

## 2021-05-14 ENCOUNTER — Encounter (HOSPITAL_COMMUNITY): Payer: Self-pay | Admitting: General Surgery

## 2021-05-14 DIAGNOSIS — T85528A Displacement of other gastrointestinal prosthetic devices, implants and grafts, initial encounter: Secondary | ICD-10-CM

## 2021-05-14 DIAGNOSIS — R1310 Dysphagia, unspecified: Secondary | ICD-10-CM

## 2021-05-14 HISTORY — PX: ESOPHAGOGASTRODUODENOSCOPY (EGD) WITH PROPOFOL: SHX5813

## 2021-05-14 HISTORY — PX: PEG PLACEMENT: SHX5437

## 2021-05-14 LAB — SURGICAL PCR SCREEN
MRSA, PCR: POSITIVE — AB
Staphylococcus aureus: POSITIVE — AB

## 2021-05-14 SURGERY — ESOPHAGOGASTRODUODENOSCOPY (EGD) WITH PROPOFOL
Anesthesia: General

## 2021-05-14 MED ORDER — PROPOFOL 500 MG/50ML IV EMUL
INTRAVENOUS | Status: DC | PRN
  Administered 2021-05-14: 125 ug/kg/min via INTRAVENOUS

## 2021-05-14 MED ORDER — ORAL CARE MOUTH RINSE: 15.0000 mL | Freq: Once | OROMUCOSAL | Status: DC

## 2021-05-14 MED ORDER — CHLORHEXIDINE GLUCONATE CLOTH 2 % EX PADS
6.0000 | MEDICATED_PAD | Freq: Once | CUTANEOUS | Status: AC
  Administered 2021-05-14: 6 via TOPICAL

## 2021-05-14 MED ORDER — TRIPLE ANTIBIOTIC EX OINT
TOPICAL_OINTMENT | CUTANEOUS | Status: DC | PRN
Start: 2021-05-14 — End: 2021-05-14
  Administered 2021-05-14: 1 mg

## 2021-05-14 MED ORDER — CHLORHEXIDINE GLUCONATE 0.12 % MT SOLN
OROMUCOSAL | Status: AC
  Administered 2021-05-14: 15 mL
  Filled 2021-05-14: qty 15

## 2021-05-14 MED ORDER — LIDOCAINE HCL 1 % IJ SOLN
INTRAMUSCULAR | Status: DC | PRN
  Administered 2021-05-14: 5 mL

## 2021-05-14 MED ORDER — PROPOFOL 10 MG/ML IV BOLUS
INTRAVENOUS | Status: DC | PRN
  Administered 2021-05-14: 50 mg via INTRAVENOUS
  Administered 2021-05-14: 20 mg via INTRAVENOUS

## 2021-05-14 MED ORDER — MUPIROCIN 2 % EX OINT
1.0000 "application " | TOPICAL_OINTMENT | Freq: Two times a day (BID) | CUTANEOUS | Status: DC
  Administered 2021-05-14: 1 via NASAL
  Filled 2021-05-14: qty 22

## 2021-05-14 MED ORDER — CHLORHEXIDINE GLUCONATE 0.12 % MT SOLN: 15.0000 mL | Freq: Once | OROMUCOSAL | Status: DC

## 2021-05-14 MED ORDER — HYDROCODONE-ACETAMINOPHEN 7.5-325 MG/15ML PO SOLN: 10.0000 mL | Freq: Four times a day (QID) | ORAL | 0 refills | Status: DC | PRN

## 2021-05-14 MED ORDER — WATER FOR IRRIGATION, STERILE IR SOLN
Status: DC | PRN
Start: 2021-05-14 — End: 2021-05-14
  Administered 2021-05-14: 1000 mL

## 2021-05-14 MED ORDER — PROPOFOL 10 MG/ML IV BOLUS
INTRAVENOUS | Status: AC
  Filled 2021-05-14: qty 40

## 2021-05-14 MED ORDER — LACTATED RINGERS IV SOLN: INTRAVENOUS | Status: DC

## 2021-05-14 MED ORDER — LIDOCAINE HCL (CARDIAC) PF 100 MG/5ML IV SOSY
PREFILLED_SYRINGE | INTRAVENOUS | Status: DC | PRN
  Administered 2021-05-14: 60 mg via INTRAVENOUS

## 2021-05-14 MED ORDER — SODIUM CHLORIDE 0.9 % IV SOLN: INTRAVENOUS | Status: DC

## 2021-05-14 MED ORDER — ENOXAPARIN SODIUM 40 MG/0.4ML IJ SOSY: 40.0000 mg | PREFILLED_SYRINGE | INTRAMUSCULAR | Status: DC

## 2021-05-14 SURGICAL SUPPLY — 6 items
CLOTH BEACON ORANGE TIMEOUT ST (SAFETY) ×2 IMPLANT
GLOVE SURG SS PI 7.5 STRL IVOR (GLOVE) ×2 IMPLANT
GLOVE SURG UNDER POLY LF SZ6.5 (GLOVE) ×2 IMPLANT
KIT PEG SAFETY 20FR (KITS) ×2 IMPLANT
MANIFOLD NEPTUNE II (INSTRUMENTS) ×2 IMPLANT
WATER STERILE IRR 1000ML POUR (IV SOLUTION) ×1 IMPLANT

## 2021-05-14 NOTE — Op Note (Signed)
Patient:  Melinda Acevedo  DOB:  06-28-1941  MRN:  [id number]   Preop Diagnosis: Dislodged gastrostomy tube  Postop Diagnosis: Same  Procedure: EGD with PEG  Surgeon: Aviva Signs, MD  Assistant: Curlene Labrum, MD  Anes: MAC  Indications: Patient is a 80 year old white female who is status post PEG placement approximately 3 weeks ago who was found on work-up to have a dislodged gastrostomy tube.  Interventional radiology was unable to guidewire the tract.  The patient now comes to the operating room for an EGD with PEG.  The risks and benefits of the procedure including bleeding, infection, and bowel injury were fully explained to the patient, who gave informed consent.  Procedure note: The patient was placed in supine position.  After anesthesia was administered, the abdomen was prepped and draped using usual sterile technique with Betadine.  Surgical site confirmation was performed.  The endoscope was advanced down to the antrum without difficulty.  The previous gastrostomy tube site was visualized.  Using the previous gastrostomy tube site on the abdominal wall, 1% Xylocaine was used for local anesthesia.  A needle catheter was advanced into the stomach within close proximity to the previous gastrostomy site.  The guidewire was then advanced and this was grasped with a snare and the endoscope was then retracted.  A 20 French gastrostomy tube was then attached and using the Ponsky pull technique, was pulled through the mouth, stomach, abdominal wall to the 4 cm Jaevion Goto without difficulty.  The endoscope was advanced back down into the stomach and adequate positioning was confirmed by endoscopy.  The mushroom bulb did cover the previous gastrostomy site.  All air was then evacuated from the stomach prior to removal of the endoscope.  Neosporin ointment was applied.  I did check the gastrostomy tube and it flushed easily.  Patient tolerated the procedure well.  She was transferred to PACU in  stable condition.  Complications: None  EBL: Minimal  Specimen: None

## 2021-05-14 NOTE — Progress Notes (Signed)
Dr. Arnoldo Morale made aware of positive MRSA and staph results. Mupirocin ointment started per protocol. No new orders at this time.

## 2021-05-14 NOTE — Discharge Summary (Signed)
Physician Discharge Summary  Patient ID: Melinda Acevedo MRN: [id number] DOB/AGE: [birth year] 79 y.o.  Admit date: 05/12/2021 Discharge date: 05/14/2021  Admission Diagnoses: Dislodgment of PEG tube, dysphagia  Discharge Diagnoses: Same Active Problems:   Dislodged gastrostomy tube   Discharged Condition: good  Hospital Course: Patient is a 80 year old white female status post PEG placement approximately 3 weeks ago who presented to the emergency room in [place] for work-up of the gastrostomy tube.  It was noted on CT scan of the abdomen to have been dislodged.  The bulb was in the subcutaneous tissue.  The patient was transferred to [facility] on 05/12/2021.  On 05/13/2021, interventional radiology attempted to replace the gastrostomy tube.  They were unsuccessful.  The patient subsequently underwent EGD with PEG placement on 05/14/2021.  She tolerated the surgery well.  Her postoperative course has been unremarkable.  She is being discharged home on 05/14/2021 in good and improving condition.  Treatments: surgery: EGD with PEG on 05/14/2021  Discharge Exam: Blood pressure (!) 162/65, pulse 68, temperature 98 F (36.7 C), temperature source Oral, resp. rate 18, height 5' (1.524 m), weight 66.2 kg, SpO2 96 %. General appearance: alert, cooperative and no distress Resp: clear to auscultation bilaterally Cardio: regular rate and rhythm, S1, S2 normal, no murmur, click, rub or gallop GI: Soft, PEG tube in appropriate position.  Disposition: Discharge disposition: 01-Home or Self Care       Discharge Instructions    Diet - low sodium heart healthy   Complete by: As directed    Increase activity slowly   Complete by: As directed      Allergies as of 05/14/2021      Reactions   Alendronate    Made bones hurt   Cinoxacin Hives   Contrast Media [iodinated Diagnostic Agents] Diarrhea   Only kidney ID dyes   Diltiazem    "made her feel like she was in a cloud"   Keflex [cephalexin]  Hives   Naproxen Other (See Comments)   Stomach upset   Nifedipine    Does not recall   Norfloxacin Hives   Pravastatin Sodium    Stomach cramps   Trimethoprim Hives   Valdecoxib    Lower belly pain      Medication List    TAKE these medications   amLODipine 5 MG tablet Commonly known as: NORVASC Take 5 mg by mouth daily.   aspirin 81 MG tablet Take 81 mg by mouth daily.   benazepril 10 MG tablet Commonly known as: LOTENSIN Take 10 mg by mouth 2 (two) times daily.   HYDROcodone-acetaminophen 7.5-325 mg/15 ml solution Commonly known as: HYCET Take 10 mLs by mouth every 6 (six) hours as needed for moderate pain.   loratadine 10 MG tablet Commonly known as: CLARITIN Take 10 mg by mouth daily.   LORazepam 0.5 MG tablet Commonly known as: ATIVAN Take 0.5 mg by mouth 3 (three) times daily as needed for anxiety.   nitroGLYCERIN 0.4 MG SL tablet Commonly known as: NITROSTAT Place 0.4 mg under the tongue every 5 (five) minutes as needed for chest pain.   ondansetron 4 MG tablet Commonly known as: ZOFRAN Take 4 mg by mouth every 8 (eight) hours as needed for vomiting or nausea.   predniSONE 50 MG tablet Commonly known as: DELTASONE Take 1 tablet (50 mg total) by mouth 3 (three) times daily as needed. Take one tablet by mouth at 11pm today, 5am tomorrow, and at 11am tomorrow.   RABEprazole 20 MG  tablet Commonly known as: ACIPHEX Take 20 mg by mouth daily.   rosuvastatin 10 MG tablet Commonly known as: CRESTOR Take 10 mg by mouth daily.   Travoprost (BAK Free) 0.004 % Soln ophthalmic solution Commonly known as: TRAVATAN Place 1 drop into both eyes at bedtime.   Vitamin D 125 MCG (5000 UT) Caps Take 5,000 Units by mouth daily.       Follow-up Information    Aviva Signs, MD Follow up.   Specialty: General Surgery Why: As needed Contact information: [address] [place] [address] [phone]               Signed: Aviva Signs 05/14/2021, 9:55 AM

## 2021-05-14 NOTE — Transfer of Care (Signed)
Immediate Anesthesia Transfer of Care Note  Patient: Melinda Acevedo  Procedure(s) Performed: ESOPHAGOGASTRODUODENOSCOPY (EGD) WITH PROPOFOL (N/A ) PERCUTANEOUS ENDOSCOPIC GASTROSTOMY (PEG) PLACEMENT (N/A )  Patient Location: PACU  Anesthesia Type:General  Level of Consciousness: awake, alert  and oriented  Airway & Oxygen Therapy: Patient Spontanous Breathing  Post-op Assessment: Report given to RN and Post -op Vital signs reviewed and stable  Post vital signs: Reviewed and stable  Last Vitals:  Vitals Value Taken Time  BP    Temp    Pulse    Resp    SpO2      Last Pain:  Vitals:   05/14/21 0654  TempSrc: Oral  PainSc: 1       Patients Stated Pain Goal: 9 (71/85/50 1586)  Complications: No complications documented.

## 2021-05-14 NOTE — Interval H&P Note (Signed)
History and Physical Interval Note:  05/14/2021 7:11 AM  Melinda Acevedo  has presented today for surgery, with the diagnosis of Percutaneous Endoscopic Gastrostomy dislodgement.  The various methods of treatment have been discussed with the patient and family. After consideration of risks, benefits and other options for treatment, the patient has consented to  Procedure(s): ESOPHAGOGASTRODUODENOSCOPY (EGD) WITH PROPOFOL (N/A) PERCUTANEOUS ENDOSCOPIC GASTROSTOMY (PEG) PLACEMENT (N/A) as a surgical intervention.  The patient's history has been reviewed, patient examined, no change in status, stable for surgery.  I have reviewed the patient's chart and labs.  Questions were answered to the patient's satisfaction.     Aviva Signs

## 2021-05-14 NOTE — Anesthesia Postprocedure Evaluation (Signed)
Anesthesia Post Note  Patient: Melinda Acevedo  Procedure(s) Performed: ESOPHAGOGASTRODUODENOSCOPY (EGD) WITH PROPOFOL (N/A ) PERCUTANEOUS ENDOSCOPIC GASTROSTOMY (PEG) PLACEMENT (N/A )  Patient location during evaluation: PACU Anesthesia Type: General Level of consciousness: awake and alert and oriented Pain management: pain level controlled Vital Signs Assessment: post-procedure vital signs reviewed and stable Respiratory status: spontaneous breathing and respiratory function stable Cardiovascular status: blood pressure returned to baseline and stable Postop Assessment: no apparent nausea or vomiting Anesthetic complications: no   No complications documented.   Last Vitals:  Vitals:   05/14/21 0830 05/14/21 0921  BP: (!) 135/50 (!) 162/65  Pulse: 73 68  Resp: (!) 21 18  Temp:  36.7 C  SpO2: 96% 96%    Last Pain:  Vitals:   05/14/21 0921  TempSrc: Oral  PainSc: 0-No pain                 Keyira Mondesir C Stellan Vick

## 2021-05-14 NOTE — Discharge Instructions (Signed)
PEG Tube Home Guide A percutaneous endoscopic gastrostomy (PEG) tube is used to deliver food, medicine, and fluids directly into the stomach. The tube has a clamp, a cap, and two anchors (bolsters). One bolster keeps the tube from coming out of the stomach. The other bolster holds the tube against the abdomen. You will be taught how to use and adjust your PEG tube before you leave the hospital. You will also be taught how to care for the opening (stoma) in your abdomen. Make sure that you understand:  How to care for your PEG tube.  How to care for your stoma.  How to give yourself feedings and medicines.  When to call your health care provider for help. Supplies needed:  Soapy water.  Clean, plain water.  Clean washcloth.  Bandage (dressing). This is optional.  Syringe. How to care for a PEG tube Check your PEG tube every day. Make sure:  It is not too tight. The bolster should rest gently over the stoma.  It is in the correct position. There is a Benicia Bergevin on the tube that shows when it is in the correct position. Adjust the tube if you need to. Cleaning your stoma Clean your stoma every day. Follow these steps: 1. Wash your hands with soap and water for at least 20 seconds. If soap and water are not available, use hand sanitizer. 2. Check the skin around the stoma for redness, rash, swelling, drainage, or extra tissue growth. If you notice any of these, call your health care provider. 3. Wash the stoma and the skin around it using a clean, soft washcloth. Clean using a circular motion, and wipe away from the stoma opening, not toward it. ? Use warm, soapy water, and only use cleansers recommended by your health care provider. ? Rinse the stoma area with plain water. ? Pat the stoma area dry. 4. Place a dressing over the stoma if your health care provider told you to do that.   Giving yourself a feeding Your health care provider will give you instructions about:  How much  nutrition and fluid you will need for each feeding.  How often to have a feeding.  Whether to take medicine in the tube by itself or with a feeding. To give yourself a feeding, follow these steps: 1. Lay out all of the equipment that you will need. 2. Make sure that the nutritional formula is at room temperature. 3. Wash your hands with soap and water for at least 20 seconds. If soap and water are not available, use hand sanitizer. 4. Position yourself so that you are upright. You will need to stay upright throughout the feeding and for at least 30 minutes after the feeding. 5. Make sure the syringe plunger is pushed in. Place the tip of the syringe in clean water, and slowly pull the plunger to bring (draw up) the water into the syringe. 6. Remove the clamp and the cap from the PEG tube. 7. Push the water out of the syringe to clean (flush) the tube. 8. If the tube is clear, draw up the formula into the syringe. Make sure to use the right amount for each feeding and add water if necessary. 9. Slowly push the formula from the syringe through the tube. 10. After the feeding, flush the tube with water. 11. Put the clamp and the cap on the tube. 12. Stay sitting up or standing up straight for at least 30 minutes.   Giving yourself medicine To give  yourself medicine, follow these steps: 1. Lay out all of the equipment that you will need. 2. If your medicine is in tablet form, crush the tablet and dissolve it in water. 3. Wash your hands with soap and water for at least 20 seconds. If soap and water are not available, use hand sanitizer. 4. Position yourself so that you are upright. You will need to stay upright while you give yourself medicine and for at least 30 minutes afterward. 5. Make sure the syringe plunger is pushed in. Place the tip of the syringe in clean water, and slowly pull the plunger to bring (draw up) the water into the syringe. 6. Remove the clamp and the cap from the PEG  tube. 7. Push the water out of the syringe to clean (flush) the tube. 8. If the tube is clear, draw up the medicine into the syringe. 9. Slowly push the medicine from the syringe through the tube. 10. Flush the tube with water. 11. Put the clamp and the cap on the tube. 12. Stay sitting up or standing up straight for at least 30 minutes. Do not take sustained release (SR) medicines through your tube. If you are unsure if your medicine is an SR medicine, ask your health care provider or pharmacist.   Contact a health care provider if you have:  Soreness, redness, or irritation around your stoma.  Abdominal pain or bloating during or after your feedings.  Nausea, constipation, or diarrhea that will not go away.  A fever.  Problems with your PEG tube. Get help right away if:  Your tube is blocked.  Your tube falls out.  You have pain around your stoma.  You are bleeding from your stoma.  Your tube is leaking.  You choke or you have trouble breathing during or after a feeding. Summary  A percutaneous endoscopic gastrostomy (PEG) tube is used to deliver food and fluids directly into the stomach.  You will be taught how to use and adjust your PEG tube. You will also be taught how to care for the stoma in your abdomen.  Your health care provider will give you instructions on how to give yourself nutritional formula and medicines through your PEG tube.  Contact your health care provider if you have a fever or soreness, redness, or irritation around your stoma.  Get help right away if your tube leaks, is blocked, or falls out. Get help right away if you have pain or bleeding around your stoma. This information is not intended to replace advice given to you by your health care provider. Make sure you discuss any questions you have with your health care provider. Document Revised: 04/18/2020 Document Reviewed: 04/18/2020 Elsevier Patient Education  [place].

## 2021-05-15 ENCOUNTER — Encounter (HOSPITAL_COMMUNITY): Payer: Self-pay | Admitting: General Surgery

## 2021-05-27 ENCOUNTER — Encounter: Payer: Self-pay | Admitting: Nutrition

## 2021-05-27 ENCOUNTER — Encounter: Payer: Medicare Other | Attending: Family Medicine | Admitting: Nutrition

## 2021-05-27 ENCOUNTER — Other Ambulatory Visit: Payer: Self-pay

## 2021-05-27 VITALS — Ht 60.0 in | Wt 142.2 lb

## 2021-05-27 DIAGNOSIS — E782 Mixed hyperlipidemia: Secondary | ICD-10-CM

## 2021-05-27 DIAGNOSIS — R634 Abnormal weight loss: Secondary | ICD-10-CM | POA: Insufficient documentation

## 2021-05-27 DIAGNOSIS — R1310 Dysphagia, unspecified: Secondary | ICD-10-CM | POA: Insufficient documentation

## 2021-05-27 NOTE — Patient Instructions (Addendum)
Goals  Slowly increase solid foods as tolerated Continue with 3-4 cans of Glucerna with water flushes depending on appetite. Avoid fried foods and spicy foods Maintain weght of 142-150 lbs range. Call MD if any problems or concerns with feeding tube.

## 2021-05-27 NOTE — Progress Notes (Signed)
Medical Nutrition Therapy  Appointment Start time:  1100 Appointment End time:  1130  Primary concerns today: PEG Feedigns Referral diagnosis: Preferred learning style:  no preference indicated Learning readiness:  change in progress   NUTRITION ASSESSMENT  PEG Feeding follow up Labs have improved since getting the PEG feedings. Lost a few pounds but is improving her appetite and food intake most recently. She saw the she speech therapist and no physical findings for dysphagia were identified. Eating a lot more solid foods and is enjoying the taste of most of them. Enjoys eating more now. Likes soups, meatloaf, mashed vegetables. Still can't do much with fruits due to sweetness. Texture and taste have changed for the better. Still can't do well with breads and anything with a sweet taste. Getting in 3-4 cans of Glucerna per day with 240 mls of water flushes.  Feels so much better, happier and is enjoying eating again. Still eating small portions of food throughout the day and tolerating them for the most part. Supplments PO intake with PEG Feedings as she is not able to consume enough solid food at this point to sustain her nutritional needs.   Had her PEG tube replaced a few weeks ago and is doing much better with this one.  Anthropometrics  Wt Readings from Last 3 Encounters:  05/27/21 142 lb 3.2 oz (64.5 kg)  05/12/21 146 lb (66.2 kg)  05/07/21 145 lb (65.8 kg)   Ht Readings from Last 3 Encounters:  05/27/21 5' (1.524 m)  05/12/21 5' (1.524 m)  05/07/21 5' (1.524 m)   Body mass index is 27.77 kg/m. @BMIFA @ Facility age limit for growth percentiles is 20 years. Facility age limit for growth percentiles is 20 years.   Clinical Medical Hx: see chart Medications: see chart Labs:  CMP Latest Ref Rng & Units 05/13/2021 04/23/2021 04/10/2021  Glucose 70 - 99 mg/dL 88 168(H) 83  BUN 8 - 23 mg/dL 8 13 19   Creatinine 0.44 - 1.00 mg/dL 0.54 0.54 0.56  Sodium 135 - 145 mmol/L 140  134(L) 142  Potassium 3.5 - 5.1 mmol/L 3.7 3.4(L) 3.3(L)  Chloride 98 - 111 mmol/L 102 94(L) 107  CO2 22 - 32 mmol/L 29 30 24   Calcium 8.9 - 10.3 mg/dL 8.9 9.2 8.6(L)  Total Protein 6.5 - 8.1 g/dL - 7.6 -  Total Bilirubin 0.3 - 1.2 mg/dL - 0.5 -  Alkaline Phos 38 - 126 U/L - 67 -  AST 15 - 41 U/L - 34 -  ALT 0 - 44 U/L - 48(H) -   CBC    Component Value Date/Time   WBC 11.1 (H) 05/13/2021 1454   RBC 4.03 05/13/2021 1454   HGB 12.4 05/13/2021 1454   HCT 39.9 05/13/2021 1454   PLT 282 05/13/2021 1454   MCV 99.0 05/13/2021 1454   MCH 30.8 05/13/2021 1454   MCHC 31.1 05/13/2021 1454   RDW 14.0 05/13/2021 1454   LYMPHSABS 1.8 04/23/2021 0146   MONOABS 1.9 (H) 04/23/2021 0146   EOSABS 0.2 04/23/2021 0146   BASOSABS 0.1 04/23/2021 0146    Notable Signs/Symptoms:   Lifestyle & Dietary HxPEG  Replaced 2 weeks. Doing much better   Getting in 4-5 cans of GLucern per day 240+ water flushes.   24-Hr Dietary Recall First Meal: 8  Am 240 mls of GLucerna 10-11 am 1 can glucerena with 60 mls of water flushes Snack: Tomato soup, strawberries 4-5 pm 1 can Glucerna Snack: boiled egg Third Meal: 1/3 of an  egg frattata; eggs, ham and cheese frozen meal Dinner: Meatloaf, creamed potatoes and some lima beans, Sips of Pepsi  1 can glucerna with water.  Beverages: water, pepsi, coffee,   Estimated Energy Needs Calories: 1400  Carbohydrate: 158g Protein: 105g Fat: 39g   NUTRITION DIAGNOSIS  [place]-1.1 Swallowing difficulty As related to dysphagia.  As evidenced by PEG Feedings and inability to swallow..   NUTRITION INTERVENTION  Nutrition education (E-1) on the following topics:  . Nutritional quality of different foods for overall nutrition . Importance of water intake .   Handouts Provided Include     Learning Style & Readiness for Change Teaching method utilized: Visual & Auditory  Demonstrated degree of understanding via: Teach Back  Barriers to learning/adherence to  lifestyle change: none  Goals Established by Pt Goals  Slowly increase solid foods as tolerated Continue with 3-4 cans of Glucerna with water flushes depending on appetite. Avoid fried foods and spicy foods Maintain weght of 142-150 lbs range. Call MD if any problems or concerns with feeding tube.   MONITORING & EVALUATION Dietary intake, weekly physical activity,  PRN Next Steps  Patient is to keep up the great job!Marland Kitchen

## 2021-06-25 ENCOUNTER — Other Ambulatory Visit: Payer: Self-pay

## 2021-06-25 ENCOUNTER — Ambulatory Visit (INDEPENDENT_AMBULATORY_CARE_PROVIDER_SITE_OTHER): Payer: Medicare Other | Admitting: General Surgery

## 2021-06-25 ENCOUNTER — Encounter: Payer: Self-pay | Admitting: General Surgery

## 2021-06-25 VITALS — BP 137/76 | HR 68 | Temp 98.9°F | Resp 14 | Ht 60.0 in | Wt 144.0 lb

## 2021-06-25 DIAGNOSIS — K9429 Other complications of gastrostomy: Secondary | ICD-10-CM

## 2021-06-25 DIAGNOSIS — I6529 Occlusion and stenosis of unspecified carotid artery: Secondary | ICD-10-CM | POA: Diagnosis not present

## 2021-06-25 NOTE — Progress Notes (Signed)
Subjective:     Melinda Acevedo  Patient presents for PEG tube site evaluation.  She states she has had some yellow bloody drainage from the PEG site.  The PEG is otherwise working well.  No gastric contents or leakage is noted from the PEG. Objective:    BP 137/76   Pulse 68   Temp 98.9 F (37.2 C) (Temporal)   Resp 14   Ht 5' (1.524 m)   Wt 144 lb (65.3 kg)   SpO2 94%   BMI 28.12 kg/m   General:  alert, cooperative, and no distress  PEG site with small amount of granulation tissue medially.  Silver nitrate was applied.  Otherwise, the PEG appears in normal position.     Assessment:    Granulation tissue at PEG site    Plan:   We will see patient again in 1 week for follow-up to evaluate the granulation tissue.  Continue using PEG.  Apply Neosporin ointment to the PEG site.

## 2021-07-02 ENCOUNTER — Ambulatory Visit (INDEPENDENT_AMBULATORY_CARE_PROVIDER_SITE_OTHER): Payer: Medicare Other | Admitting: General Surgery

## 2021-07-02 ENCOUNTER — Encounter: Payer: Self-pay | Admitting: General Surgery

## 2021-07-02 ENCOUNTER — Other Ambulatory Visit: Payer: Self-pay

## 2021-07-02 VITALS — BP 153/76 | HR 62 | Temp 98.5°F | Resp 16 | Ht 60.0 in | Wt 145.0 lb

## 2021-07-02 DIAGNOSIS — I6529 Occlusion and stenosis of unspecified carotid artery: Secondary | ICD-10-CM

## 2021-07-02 DIAGNOSIS — K9429 Other complications of gastrostomy: Secondary | ICD-10-CM | POA: Diagnosis not present

## 2021-07-02 NOTE — Progress Notes (Signed)
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  Patient states the drainage has decreased, though she still has some irritated skin around the PEG site exit. Objective:    BP (!) 153/76   Pulse 62   Temp 98.5 F (36.9 C) (Other (Comment))   Resp 16   Ht 5' (1.524 m)   Wt 145 lb (65.8 kg)   SpO2 95%   BMI 28.32 kg/m   General:  alert, cooperative, and no distress  PEG tube in place.  Small amount of granulation tissue noted inferiorly.  Silver nitrate was applied.     Assessment:    Irritation and granulation tissue at PEG site, resolving    Plan:   Follow-up here in 2 weeks for PEG site check.

## 2021-07-16 ENCOUNTER — Encounter: Payer: Self-pay | Admitting: General Surgery

## 2021-07-16 ENCOUNTER — Ambulatory Visit (INDEPENDENT_AMBULATORY_CARE_PROVIDER_SITE_OTHER): Payer: Medicare Other | Admitting: General Surgery

## 2021-07-16 ENCOUNTER — Other Ambulatory Visit: Payer: Self-pay

## 2021-07-16 VITALS — BP 145/74 | HR 64 | Temp 98.4°F | Resp 14 | Ht 60.0 in | Wt 145.0 lb

## 2021-07-16 DIAGNOSIS — I6529 Occlusion and stenosis of unspecified carotid artery: Secondary | ICD-10-CM | POA: Diagnosis not present

## 2021-07-16 DIAGNOSIS — K9429 Other complications of gastrostomy: Secondary | ICD-10-CM

## 2021-07-16 NOTE — Progress Notes (Signed)
Subjective:     Melinda Acevedo  Here for PEG site evaluation.  Patient states she has had less bleeding and irritation around the PEG site.  Occasional blood is noted on the bandage.  It is working fine. Objective:    BP (!) 145/74   Pulse 64   Temp 98.4 F (36.9 C) (Other (Comment))   Resp 14   Ht 5' (1.524 m)   Wt 145 lb (65.8 kg)   SpO2 93%   BMI 28.32 kg/m   General:  alert, cooperative, and no distress  PEG site with a small amount of granulation tissue present than before.  Silver nitrate applied.     Assessment:    PEG site irritation from granulation tissue, resolving    Plan:   Patient told to call my office and return should the site continue to cause irritation.

## 2021-08-13 ENCOUNTER — Encounter: Payer: Self-pay | Admitting: General Surgery

## 2021-08-13 ENCOUNTER — Ambulatory Visit (INDEPENDENT_AMBULATORY_CARE_PROVIDER_SITE_OTHER): Payer: Medicare Other | Admitting: General Surgery

## 2021-08-13 ENCOUNTER — Other Ambulatory Visit: Payer: Self-pay

## 2021-08-13 VITALS — BP 159/74 | HR 62 | Temp 98.6°F | Resp 12 | Ht 60.0 in | Wt 144.0 lb

## 2021-08-13 DIAGNOSIS — I6529 Occlusion and stenosis of unspecified carotid artery: Secondary | ICD-10-CM

## 2021-08-13 DIAGNOSIS — Z431 Encounter for attention to gastrostomy: Secondary | ICD-10-CM

## 2021-08-13 NOTE — Progress Notes (Signed)
Subjective:     Melinda Acevedo  Presents for follow-up treatment of granulomatous tissue occurring around the PEG site.  The PEG is otherwise working well. Objective:    BP (!) 159/74   Pulse 62   Temp 98.6 F (37 C) (Other (Comment))   Resp 12   Ht 5' (1.524 m)   Wt 144 lb (65.3 kg)   SpO2 94%   BMI 28.12 kg/m   General:  alert, cooperative, and no distress  PEG site with a small amount of granulation tissue noted along the medial and inferior aspect.  Silver nitrate was applied.  She tolerated the procedure well.     Assessment:    Encounter for PEG site granulomatous tissue    Plan:   Follow-up as needed for granulation tissue development.

## 2021-08-20 ENCOUNTER — Telehealth (INDEPENDENT_AMBULATORY_CARE_PROVIDER_SITE_OTHER): Payer: Self-pay | Admitting: *Deleted

## 2021-08-20 NOTE — Telephone Encounter (Signed)
error 

## 2021-08-20 NOTE — Telephone Encounter (Signed)
Given the poor prep although I agree with proceeding with a repeat colonoscopy while she still has her feeding tube.  Darius Bump, can you please schedule a colonoscopy? Dx: history of colon polyps. Room: 3. I will do this procedure once I'm back, unless the patient has her tube removed in September; if so, Dr. Laural Golden can do her case instead.  Thanks,  Maylon Peppers, MD Gastroenterology and Hepatology [facility]

## 2021-08-20 NOTE — Telephone Encounter (Signed)
Pt called and wanted to see if she could have a colonoscopy scheduled. Her last one was feb 2022. States she was not cleaned out and was told everything could not been seen. I looked at report and it says to repeat in one year due to poor prep. I let pt know she would be due in feb 2023. She states she looked up you could have one every 6 months and she is currently on a feeding tube but not sure how much longer she will be on one and would like to do it now because she had trouble drinking the prep and would like to do while on feeding tube. States she had a ct scan that showed thickening in the colon and was concerned about that as well.   [phone].

## 2021-08-20 NOTE — Telephone Encounter (Signed)
Pt was called and notified that colonoscopy would be scheduled and she would be called back. Darius Bump please see message below. thanks

## 2021-08-26 ENCOUNTER — Other Ambulatory Visit (INDEPENDENT_AMBULATORY_CARE_PROVIDER_SITE_OTHER): Payer: Self-pay

## 2021-08-26 DIAGNOSIS — Z86010 Personal history of colonic polyps: Secondary | ICD-10-CM

## 2021-08-26 DIAGNOSIS — Z1211 Encounter for screening for malignant neoplasm of colon: Secondary | ICD-10-CM

## 2021-08-27 ENCOUNTER — Encounter (INDEPENDENT_AMBULATORY_CARE_PROVIDER_SITE_OTHER): Payer: Self-pay

## 2021-08-31 ENCOUNTER — Ambulatory Visit: Payer: Medicare Other | Admitting: Cardiology

## 2021-09-01 ENCOUNTER — Other Ambulatory Visit: Payer: Self-pay

## 2021-09-01 ENCOUNTER — Encounter: Payer: Self-pay | Admitting: General Surgery

## 2021-09-01 ENCOUNTER — Ambulatory Visit (INDEPENDENT_AMBULATORY_CARE_PROVIDER_SITE_OTHER): Payer: Medicare Other | Admitting: General Surgery

## 2021-09-01 VITALS — BP 153/61 | HR 70 | Temp 98.7°F | Resp 16 | Ht 60.0 in | Wt 143.0 lb

## 2021-09-01 DIAGNOSIS — L929 Granulomatous disorder of the skin and subcutaneous tissue, unspecified: Secondary | ICD-10-CM

## 2021-09-01 DIAGNOSIS — I6529 Occlusion and stenosis of unspecified carotid artery: Secondary | ICD-10-CM

## 2021-09-01 NOTE — Progress Notes (Signed)
Subjective:     Melinda Acevedo  Here for follow-up of granulation tissue buildup at PEG site.  Patient has been applying some hydrocortisone cream to the exit site of the gastrostomy tube.  Minimal bleeding noted. Objective:    BP (!) 153/61   Pulse 70   Temp 98.7 F (37.1 C) (Other (Comment))   Resp 16   Ht 5' (1.524 m)   Wt 143 lb (64.9 kg)   SpO2 (!) 65%   BMI 27.93 kg/m   General:  alert, cooperative, and no distress  There is still some granulation tissue noted along the inferior aspect of the gastrostomy tube exit site, but it is less than before.  Silver nitrate was applied.  She tolerated the procedure well.     Assessment:    Granulation tissue at PEG site, resolving    Plan:   Follow-up here as needed.

## 2021-09-18 ENCOUNTER — Ambulatory Visit: Payer: Medicare Other | Admitting: Cardiology

## 2021-09-23 NOTE — Patient Instructions (Signed)
Melinda Acevedo  09/23/2021     @PREFPERIOPPHARMACY @   Your procedure is scheduled on 09/29/2021.   [facility] to [facility] at  [place]   Call this number if you have problems the morning of surgery:  [phone]   Remember:  Follow the diet and prep instructions given to you by the office.    Take these medicines the morning of surgery with A SIP OF WATER        Ativan (if needed), zofran (if needed), aciphex.     Do not wear jewelry, make-up or nail polish.  Do not wear lotions, powders, or perfumes, or deodorant.  Do not shave 48 hours prior to surgery.  Men may shave face and neck.  Do not bring valuables to the hospital.  [facility] is not responsible for any belongings or valuables.  Contacts, dentures or bridgework may not be worn into surgery.  Leave your suitcase in the car.  After surgery it may be brought to your room.  For patients admitted to the hospital, discharge time will be determined by your treatment team.  Patients discharged the day of surgery will not be allowed to drive home and must have someone with them for 24 hours.    Special instructions:   DO NOT smoke tobacco or vape for 24 hours before your procedure.  Please read over the following fact sheets that you were given. Anesthesia Post-op Instructions and Care and Recovery After Surgery      Colonoscopy, Adult, Care After This sheet gives you information about how to care for yourself after your procedure. Your health care provider may also give you more specific instructions. If you have problems or questions, contact your health care provider. What can I expect after the procedure? After the procedure, it is common to have: A small amount of blood in your stool for 24 hours after the procedure. Some gas. Mild cramping or bloating of your abdomen. Follow these instructions at home: Eating and drinking  Drink enough fluid to keep your urine pale yellow. Follow instructions from  your health care provider about eating or drinking restrictions. Resume your normal diet as instructed by your health care provider. Avoid heavy or fried foods that are hard to digest. Activity Rest as told by your health care provider. Avoid sitting for a long time without moving. Get up to take short walks every 1-2 hours. This is important to improve blood flow and breathing. Ask for help if you feel weak or unsteady. Return to your normal activities as told by your health care provider. Ask your health care provider what activities are safe for you. Managing cramping and bloating  Try walking around when you have cramps or feel bloated. Apply heat to your abdomen as told by your health care provider. Use the heat source that your health care provider recommends, such as a moist heat pack or a heating pad. Place a towel between your skin and the heat source. Leave the heat on for 20-30 minutes. Remove the heat if your skin turns bright red. This is especially important if you are unable to feel pain, heat, or cold. You may have a greater risk of getting burned. General instructions If you were given a sedative during the procedure, it can affect you for several hours. Do not drive or operate machinery until your health care provider says that it is safe. For the first 24 hours after the procedure: Do not sign  important documents. Do not drink alcohol. Do your regular daily activities at a slower pace than normal. Eat soft foods that are easy to digest. Take over-the-counter and prescription medicines only as told by your health care provider. Keep all follow-up visits as told by your health care provider. This is important. Contact a health care provider if: You have blood in your stool 2-3 days after the procedure. Get help right away if you have: More than a small spotting of blood in your stool. Large blood clots in your stool. Swelling of your abdomen. Nausea or vomiting. A  fever. Increasing pain in your abdomen that is not relieved with medicine. Summary After the procedure, it is common to have a small amount of blood in your stool. You may also have mild cramping and bloating of your abdomen. If you were given a sedative during the procedure, it can affect you for several hours. Do not drive or operate machinery until your health care provider says that it is safe. Get help right away if you have a lot of blood in your stool, nausea or vomiting, a fever, or increased pain in your abdomen. This information is not intended to replace advice given to you by your health care provider. Make sure you discuss any questions you have with your health care provider. Document Revised: 11/30/2019 Document Reviewed: 07/02/2019 Elsevier Patient Education  [place] After This sheet gives you information about how to care for yourself after your procedure. Your health care provider may also give you more specific instructions. If you have problems or questions, contact your health care provider. What can I expect after the procedure? After the procedure, it is common to have: Tiredness. Forgetfulness about what happened after the procedure. Impaired judgment for important decisions. Nausea or vomiting. Some difficulty with balance. Follow these instructions at home: For the time period you were told by your health care provider:   Rest as needed. Do not participate in activities where you could fall or become injured. Do not drive or use machinery. Do not drink alcohol. Do not take sleeping pills or medicines that cause drowsiness. Do not make important decisions or sign legal documents. Do not take care of children on your own. Eating and drinking Follow the diet that is recommended by your health care provider. Drink enough fluid to keep your urine pale yellow. If you vomit: Drink water, juice, or soup when you can drink  without vomiting. Make sure you have little or no nausea before eating solid foods. General instructions Have a responsible adult stay with you for the time you are told. It is important to have someone help care for you until you are awake and alert. Take over-the-counter and prescription medicines only as told by your health care provider. If you have sleep apnea, surgery and certain medicines can increase your risk for breathing problems. Follow instructions from your health care provider about wearing your sleep device: Anytime you are sleeping, including during daytime naps. While taking prescription pain medicines, sleeping medicines, or medicines that make you drowsy. Avoid smoking. Keep all follow-up visits as told by your health care provider. This is important. Contact a health care provider if: You keep feeling nauseous or you keep vomiting. You feel light-headed. You are still sleepy or having trouble with balance after 24 hours. You develop a rash. You have a fever. You have redness or swelling around the IV site. Get help right away if: You have  trouble breathing. You have new-onset confusion at home. Summary For several hours after your procedure, you may feel tired. You may also be forgetful and have poor judgment. Have a responsible adult stay with you for the time you are told. It is important to have someone help care for you until you are awake and alert. Rest as told. Do not drive or operate machinery. Do not drink alcohol or take sleeping pills. Get help right away if you have trouble breathing, or if you suddenly become confused. This information is not intended to replace advice given to you by your health care provider. Make sure you discuss any questions you have with your health care provider. Document Revised: 08/21/2020 Document Reviewed: 11/08/2019 Elsevier Patient Education  2022 [employer].

## 2021-09-24 ENCOUNTER — Encounter (HOSPITAL_COMMUNITY)
Admission: RE | Admit: 2021-09-24 | Discharge: 2021-09-24 | Disposition: A | Discharge status: Home / Self Care | Payer: Medicare Other | Source: Ambulatory Visit | Attending: Gastroenterology | Admitting: Gastroenterology

## 2021-09-24 ENCOUNTER — Encounter (HOSPITAL_COMMUNITY): Payer: Self-pay

## 2021-09-24 DIAGNOSIS — Z01812 Encounter for preprocedural laboratory examination: Secondary | ICD-10-CM | POA: Insufficient documentation

## 2021-09-24 LAB — BASIC METABOLIC PANEL
Anion gap: 7 (ref 5–15)
BUN: 14 mg/dL (ref 8–23)
CO2: 28 mmol/L (ref 22–32)
Calcium: 9.3 mg/dL (ref 8.9–10.3)
Chloride: 104 mmol/L (ref 98–111)
Creatinine, Ser: 0.57 mg/dL (ref 0.44–1.00)
GFR, Estimated: >60 mL/min (ref >60)
Glucose, Bld: 114 mg/dL — ABNORMAL HIGH (ref 70–99)
Potassium: 3.5 mmol/L (ref 3.5–5.1)
Sodium: 139 mmol/L (ref 135–145)

## 2021-09-29 ENCOUNTER — Encounter (HOSPITAL_COMMUNITY)
Admission: RE | Disposition: A | Discharge status: Home / Self Care | Payer: Self-pay | Source: Home / Self Care | Attending: Gastroenterology

## 2021-09-29 ENCOUNTER — Encounter (HOSPITAL_COMMUNITY): Payer: Self-pay | Admitting: Gastroenterology

## 2021-09-29 ENCOUNTER — Ambulatory Visit (HOSPITAL_COMMUNITY): Payer: Medicare Other | Admitting: Anesthesiology

## 2021-09-29 ENCOUNTER — Other Ambulatory Visit: Payer: Self-pay

## 2021-09-29 ENCOUNTER — Ambulatory Visit (HOSPITAL_COMMUNITY)
Admission: RE | Admit: 2021-09-29 | Discharge: 2021-09-29 | Disposition: A | Discharge status: Home / Self Care | Payer: Medicare Other | Attending: Gastroenterology | Admitting: Gastroenterology

## 2021-09-29 DIAGNOSIS — I2510 Atherosclerotic heart disease of native coronary artery without angina pectoris: Secondary | ICD-10-CM | POA: Insufficient documentation

## 2021-09-29 DIAGNOSIS — R7303 Prediabetes: Secondary | ICD-10-CM | POA: Diagnosis not present

## 2021-09-29 DIAGNOSIS — Z87891 Personal history of nicotine dependence: Secondary | ICD-10-CM | POA: Diagnosis not present

## 2021-09-29 DIAGNOSIS — D123 Benign neoplasm of transverse colon: Secondary | ICD-10-CM

## 2021-09-29 DIAGNOSIS — I10 Essential (primary) hypertension: Secondary | ICD-10-CM | POA: Diagnosis not present

## 2021-09-29 DIAGNOSIS — E785 Hyperlipidemia, unspecified: Secondary | ICD-10-CM | POA: Diagnosis not present

## 2021-09-29 DIAGNOSIS — Z1211 Encounter for screening for malignant neoplasm of colon: Secondary | ICD-10-CM | POA: Diagnosis present

## 2021-09-29 DIAGNOSIS — H409 Unspecified glaucoma: Secondary | ICD-10-CM | POA: Diagnosis not present

## 2021-09-29 DIAGNOSIS — Z09 Encounter for follow-up examination after completed treatment for conditions other than malignant neoplasm: Secondary | ICD-10-CM

## 2021-09-29 DIAGNOSIS — K5730 Diverticulosis of large intestine without perforation or abscess without bleeding: Secondary | ICD-10-CM

## 2021-09-29 DIAGNOSIS — Z86010 Personal history of colonic polyps: Secondary | ICD-10-CM | POA: Diagnosis not present

## 2021-09-29 DIAGNOSIS — Z8249 Family history of ischemic heart disease and other diseases of the circulatory system: Secondary | ICD-10-CM | POA: Diagnosis not present

## 2021-09-29 DIAGNOSIS — Z955 Presence of coronary angioplasty implant and graft: Secondary | ICD-10-CM | POA: Insufficient documentation

## 2021-09-29 HISTORY — PX: POLYPECTOMY: SHX5525

## 2021-09-29 HISTORY — PX: COLONOSCOPY WITH PROPOFOL: SHX5780

## 2021-09-29 LAB — HM COLONOSCOPY

## 2021-09-29 SURGERY — COLONOSCOPY WITH PROPOFOL
Anesthesia: General

## 2021-09-29 MED ORDER — PROPOFOL 10 MG/ML IV BOLUS
INTRAVENOUS | Status: DC | PRN
  Administered 2021-09-29: 75 ug/kg/min via INTRAVENOUS
  Administered 2021-09-29: 80 mg via INTRAVENOUS

## 2021-09-29 MED ORDER — STERILE WATER FOR IRRIGATION IR SOLN
Status: DC | PRN
  Administered 2021-09-29: 200 mL

## 2021-09-29 MED ORDER — LIDOCAINE HCL (CARDIAC) PF 100 MG/5ML IV SOSY
PREFILLED_SYRINGE | INTRAVENOUS | Status: DC | PRN
  Administered 2021-09-29: 80 mg via INTRAVENOUS

## 2021-09-29 MED ORDER — LACTATED RINGERS IV SOLN: INTRAVENOUS | Status: DC

## 2021-09-29 NOTE — Anesthesia Postprocedure Evaluation (Signed)
Anesthesia Post Note  Patient: Melinda Acevedo  Procedure(s) Performed: COLONOSCOPY WITH PROPOFOL POLYPECTOMY  Patient location during evaluation: Phase II Anesthesia Type: General Level of consciousness: awake and alert and oriented Pain management: pain level controlled Vital Signs Assessment: post-procedure vital signs reviewed and stable Respiratory status: respiratory function stable and spontaneous breathing Cardiovascular status: blood pressure returned to baseline and stable Postop Assessment: no apparent nausea or vomiting Anesthetic complications: no   No notable events documented.   Last Vitals:  Vitals:   09/29/21 0839 09/29/21 1038  BP: (!) 156/62 (!) 135/55  Pulse: 72 62  Resp: 18 18  Temp: 36.8 C (!) 36.2 C  SpO2: 96% 99%    Last Pain:  Vitals:   09/29/21 1038  TempSrc: Axillary  PainSc: 0-No pain                 Melinda Acevedo

## 2021-09-29 NOTE — Discharge Instructions (Signed)
You are being discharged to home.  Resume your previous diet.  We are waiting for your pathology results.  Your physician has recommended a repeat colonoscopy (date to be determined after pending pathology results are reviewed) for surveillance.  

## 2021-09-29 NOTE — H&P (Signed)
Melinda Acevedo is an 80 y.o. female.   Chief Complaint: History of colon polyps HPI: Melinda Acevedo is a 80 y.o. female with PMH CAD s/p stent placement, carotid artery occlusion, HLD, HTN, pre diabetes, glaucoma, who presents for history of colon polyps.  Last Colonoscopy:5 years ago, had some polyps removed (adenomas) per patient no report is available.  The patient denies having any complaints such as melena, hematochezia, abdominal pain or distention, change in her bowel movement consistency or frequency, no changes in her weight recently.  No family history of colorectal cancer.   Past Medical History:  Diagnosis Date   Anxiety    CAD (coronary artery disease)    Carotid artery occlusion    Diabetes mellitus without complication (HCC)    Glaucoma    History of hiatal hernia    Hyperlipidemia    Hypertension    Irregular heart beats    Seasonal allergies    Sleep apnea     Past Surgical History:  Procedure Laterality Date   ABDOMINAL HYSTERECTOMY     CATARACT EXTRACTION Bilateral    CHOLECYSTECTOMY     COLONOSCOPY  2017   Dr.Williams in [place] , [place]   COLONOSCOPY WITH PROPOFOL N/A 02/06/2021   Procedure: COLONOSCOPY WITH PROPOFOL;  Surgeon: Harvel Quale, MD;  [facility];  Service: Gastroenterology;  Laterality: N/A;  10:30   CORONARY ANGIOPLASTY     stent   ESOPHAGOGASTRODUODENOSCOPY (EGD) WITH PROPOFOL N/A 02/06/2021   Procedure: ESOPHAGOGASTRODUODENOSCOPY (EGD) WITH PROPOFOL;  Surgeon: Harvel Quale, MD;  [facility];  Service: Gastroenterology;  Laterality: N/A;   ESOPHAGOGASTRODUODENOSCOPY (EGD) WITH PROPOFOL N/A 04/17/2021   Procedure: ESOPHAGOGASTRODUODENOSCOPY (EGD) WITH PROPOFOL;  Surgeon: Aviva Signs, MD;  Location: [facility];  Service: General;  Laterality: N/A;   ESOPHAGOGASTRODUODENOSCOPY (EGD) WITH PROPOFOL N/A 05/14/2021   Procedure: ESOPHAGOGASTRODUODENOSCOPY (EGD) WITH PROPOFOL;  Surgeon: Aviva Signs, MD;   Location: [facility];  Service: General;  Laterality: N/A;   heart catherization  0981,1914, 2010   IR GASTROSTOMY TUBE REMOVAL  05/13/2021   PEG PLACEMENT N/A 04/17/2021   Procedure: PERCUTANEOUS ENDOSCOPIC GASTROSTOMY (PEG) PLACEMENT;  Surgeon: Aviva Signs, MD;  Location: [facility];  Service: General;  Laterality: N/A;   PEG PLACEMENT N/A 05/14/2021   Procedure: PERCUTANEOUS ENDOSCOPIC GASTROSTOMY (PEG) PLACEMENT;  Surgeon: Aviva Signs, MD;  Location: [facility];  Service: General;  Laterality: N/A;   [place]   UPPER GASTROINTESTINAL ENDOSCOPY  2015   Dr.Williams in [place], [place]   VAGINAL HYSTERECTOMY  1991    Family History  Problem Relation Age of Onset   COPD Mother    Heart disease Mother    Heart disease Father    Heart attack Father    Hypertension Sister    Mitral valve prolapse Brother    Social History:  reports that she quit smoking about 58 years ago. Her smoking use included cigarettes. She has never used smokeless tobacco. She reports that she does not drink alcohol and does not use drugs.  Allergies:  Allergies  Allergen Reactions   Alendronate     Made bones hurt   Cinoxacin Hives   Contrast Media [Iodinated Diagnostic Agents] Diarrhea    Only kidney ID dyes   Diltiazem     "made her feel like she was in a cloud"   Keflex [Cephalexin] Hives   Naproxen Other (See Comments)    Stomach upset  Nifedipine Other (See Comments)    Unknown reaction   Norfloxacin Hives   Pravastatin Sodium Other (See Comments)    Stomach cramps   Trimethoprim Hives   Valdecoxib Other (See Comments)    Lower belly pain    Medications Prior to Admission  Medication Sig Dispense Refill   Apple Cider Vinegar 500 MG TABS Take 500 mg by mouth daily.     aspirin 81 MG chewable tablet Chew 81 mg by mouth every evening.     benazepril (LOTENSIN) 10 MG tablet Take 10 mg by mouth every evening.     Cholecalciferol (VITAMIN D) 125 MCG  (5000 UT) CAPS Take 5,000 Units by mouth daily. Gummy     Coenzyme Q10 (COQ10) 200 MG CAPS Take 400 mg by mouth daily. Gummy     loratadine (CLARITIN) 10 MG tablet Take 10 mg by mouth every evening.     LORazepam (ATIVAN) 0.5 MG tablet Take 0.5 mg by mouth 3 (three) times daily as needed for anxiety.     Misc Natural Products (ELDERBERRY ZINC/VIT C/IMMUNE MT) Take 1 tablet by mouth daily.     Multiple Vitamins-Minerals (ADULT ONE DAILY GUMMIES) CHEW Chew 2 tablets by mouth daily.     ondansetron (ZOFRAN-ODT) 4 MG disintegrating tablet Take 4 mg by mouth every 8 (eight) hours as needed for nausea or vomiting.     RABEprazole (ACIPHEX) 20 MG tablet Take 20 mg by mouth every evening.     rosuvastatin (CRESTOR) 10 MG tablet Take 10 mg by mouth every evening.     Travoprost, BAK Free, (TRAVATAN) 0.004 % SOLN ophthalmic solution Place 1 drop into both eyes at bedtime.     vitamin B-12 (CYANOCOBALAMIN) 1000 MCG tablet Take 1,000 mcg by mouth daily. Gummy     nitroGLYCERIN (NITROSTAT) 0.4 MG SL tablet Place 0.4 mg under the tongue every 5 (five) minutes as needed for chest pain.      No results found for this or any previous visit (from the past 48 hour(s)). No results found.  Review of Systems  Constitutional: Negative.   HENT: Negative.    Eyes: Negative.   Respiratory: Negative.    Cardiovascular: Negative.   Gastrointestinal: Negative.   Endocrine: Negative.   Genitourinary: Negative.   Musculoskeletal: Negative.   Skin: Negative.   Allergic/Immunologic: Negative.   Neurological: Negative.   Hematological: Negative.   Psychiatric/Behavioral: Negative.     Blood pressure (!) 156/62, pulse 72, temperature 98.2 F (36.8 C), temperature source Oral, resp. rate 18, SpO2 96 %. Physical Exam  GENERAL: The patient is AO x3, in no acute distress. HEENT: Head is normocephalic and atraumatic. EOMI are intact. Mouth is well hydrated and without lesions. NECK: Supple. No masses LUNGS: Clear to  auscultation. No presence of rhonchi/wheezing/rales. Adequate chest expansion HEART: RRR, normal s1 and s2. ABDOMEN: Soft, nontender, no guarding, no peritoneal signs, and nondistended. BS +. No masses. Has feeding tube. EXTREMITIES: Without any cyanosis, clubbing, rash, lesions or edema. NEUROLOGIC: AOx3, no focal motor deficit. SKIN: no jaundice, no rashes  Assessment/Plan Nataliya Graig is a 80 y.o. female with PMH CAD s/p stent placement, carotid artery occlusion, HLD, HTN, pre diabetes, glaucoma, who presents for history of colon polyps. We will proceed with colonoscopy.  Harvel Quale, MD 09/29/2021, 8:59 AM

## 2021-09-29 NOTE — Transfer of Care (Signed)
Immediate Anesthesia Transfer of Care Note  Patient: Melinda Acevedo  Procedure(s) Performed: COLONOSCOPY WITH PROPOFOL POLYPECTOMY  Patient Location: Short Stay  Anesthesia Type:General  Level of Consciousness: awake, alert , oriented and patient cooperative  Airway & Oxygen Therapy: Patient Spontanous Breathing  Post-op Assessment: Report given to RN, Post -op Vital signs reviewed and stable and Patient moving all extremities X 4  Post vital signs: Reviewed and stable  Last Vitals:  Vitals Value Taken Time  BP    Temp    Pulse    Resp    SpO2      Last Pain:  Vitals:   09/29/21 0958  TempSrc:   PainSc: 0-No pain         Complications: No notable events documented.

## 2021-09-29 NOTE — Op Note (Signed)
[facility] Patient Name: Melinda Acevedo Procedure Date: 09/29/2021 9:41 AM MRN: [id number] Date of Birth: [birth year] Attending MD: Maylon Peppers ,  CSN: [id number] Age: 80 Admit Type: Outpatient Procedure:                Colonoscopy Indications:              High risk colon cancer surveillance: Personal                            history of colonic polyps Providers:                Maylon Peppers, Lambert Mody, Randa Spike, Technician Referring MD:              Medicines:                Monitored Anesthesia Care Complications:            No immediate complications. Estimated Blood Loss:     Estimated blood loss: none. Procedure:                Pre-Anesthesia Assessment:                           - Prior to the procedure, a History and Physical                            was performed, and patient medications, allergies                            and sensitivities were reviewed. The patient's                            tolerance of previous anesthesia was reviewed.                           - The risks and benefits of the procedure and the                            sedation options and risks were discussed with the                            patient. All questions were answered and informed                            consent was obtained.                           - ASA Grade Assessment: II - A patient with mild                            systemic disease.                           After obtaining informed consent, the colonoscope  was passed under direct vision. Throughout the                            procedure, the patient's blood pressure, pulse, and                            oxygen saturations were monitored continuously. The                            [id number]-HQ190L ([id number]) scope was introduced through                            the anus and advanced to the the cecum, identified                            by  appendiceal orifice and ileocecal valve. The                            colonoscopy was performed without difficulty. The                            patient tolerated the procedure well. The quality                            of the bowel preparation was excellent. Scope In: 10:07:20 AM Scope Out: 10:34:31 AM Scope Withdrawal Time: 0 hours 17 minutes 15 seconds  Total Procedure Duration: 0 hours 27 minutes 11 seconds  Findings:      The perianal and digital rectal examinations were normal.      A 8 mm polyp was found in the hepatic flexure. The polyp was       semi-sessile. The polyp was removed with a cold snare. Resection and       retrieval were complete.      Multiple medium-mouthed diverticula were found in the sigmoid colon.      The retroflexed view of the distal rectum and anal verge was normal and       showed no anal or rectal abnormalities. Impression:               - One 8 mm polyp at the hepatic flexure, removed                            with a cold snare. Resected and retrieved.                           - Diverticulosis in the sigmoid colon.                           - The distal rectum and anal verge are normal on                            retroflexion view. Moderate Sedation:      Per Anesthesia Care Recommendation:           - Discharge patient to home (ambulatory).                           -  Resume previous diet.                           - Await pathology results.                           - Repeat colonoscopy date to be determined after                            pending pathology results are reviewed for                            surveillance. Procedure Code(s):        --- Professional ---                           [phone], Colonoscopy, flexible; with removal of                            tumor(s), polyp(s), or other lesion(s) by snare                            technique Diagnosis Code(s):        --- Professional ---                           Z86.010,  Personal history of colonic polyps                           K63.5, Polyp of colon                           K57.30, Diverticulosis of large intestine without                            perforation or abscess without bleeding CPT copyright 2019 [employer] Association. All rights reserved. The codes documented in this report are preliminary and upon coder review may  be revised to meet current compliance requirements. Maylon Peppers, MD Maylon Peppers,  09/29/2021 10:38:26 AM This report has been signed electronically. Number of Addenda: 0

## 2021-09-29 NOTE — Anesthesia Preprocedure Evaluation (Signed)
Anesthesia Evaluation  Patient identified by MRN, date of birth, ID band Patient awake    Reviewed: Allergy & Precautions, NPO status , Patient's Chart, lab work & pertinent test results  Airway Mallampati: II  TM Distance: >3 FB Neck ROM: Full    Dental  (+) Dental Advisory Given, Teeth Intact   Pulmonary sleep apnea , former smoker,    Pulmonary exam normal breath sounds clear to auscultation       Cardiovascular Exercise Tolerance: Good hypertension, Pt. on medications + CAD and + Cardiac Stents  Normal cardiovascular exam Rhythm:Regular Rate:Normal     Neuro/Psych Anxiety    GI/Hepatic hiatal hernia, GERD  Medicated and Controlled,  Endo/Other  diabetes, Well Controlled, Type 2Hypothyroidism   Renal/GU      Musculoskeletal negative musculoskeletal ROS (+)   Abdominal   Peds  Hematology   Anesthesia Other Findings   Reproductive/Obstetrics negative OB ROS                             Anesthesia Physical  Anesthesia Plan  ASA: 3  Anesthesia Plan: General   Post-op Pain Management:    Induction: Intravenous  PONV Risk Score and Plan: TIVA  Airway Management Planned: Nasal Cannula and Natural Airway  Additional Equipment:   Intra-op Plan:   Post-operative Plan:   Informed Consent: I have reviewed the patients History and Physical, chart, labs and discussed the procedure including the risks, benefits and alternatives for the proposed anesthesia with the patient or authorized representative who has indicated his/her understanding and acceptance.     Dental advisory given  Plan Discussed with: CRNA and Surgeon  Anesthesia Plan Comments:         Anesthesia Quick Evaluation

## 2021-09-30 ENCOUNTER — Encounter (INDEPENDENT_AMBULATORY_CARE_PROVIDER_SITE_OTHER): Payer: Self-pay | Admitting: *Deleted

## 2021-09-30 LAB — SURGICAL PATHOLOGY

## 2021-10-02 ENCOUNTER — Encounter (HOSPITAL_COMMUNITY): Payer: Self-pay | Admitting: Gastroenterology

## 2021-10-06 ENCOUNTER — Other Ambulatory Visit: Payer: Self-pay

## 2021-10-06 ENCOUNTER — Ambulatory Visit (INDEPENDENT_AMBULATORY_CARE_PROVIDER_SITE_OTHER): Payer: Medicare Other | Admitting: General Surgery

## 2021-10-06 ENCOUNTER — Encounter: Payer: Self-pay | Admitting: General Surgery

## 2021-10-06 ENCOUNTER — Ambulatory Visit (INDEPENDENT_AMBULATORY_CARE_PROVIDER_SITE_OTHER): Payer: Medicare Other | Admitting: Cardiology

## 2021-10-06 ENCOUNTER — Encounter: Payer: Self-pay | Admitting: Cardiology

## 2021-10-06 VITALS — BP 150/86 | HR 60 | Ht 60.0 in | Wt 143.8 lb

## 2021-10-06 VITALS — BP 160/72 | HR 67 | Temp 98.6°F | Resp 12 | Ht 60.0 in | Wt 143.0 lb

## 2021-10-06 DIAGNOSIS — I10 Essential (primary) hypertension: Secondary | ICD-10-CM

## 2021-10-06 DIAGNOSIS — L929 Granulomatous disorder of the skin and subcutaneous tissue, unspecified: Secondary | ICD-10-CM

## 2021-10-06 DIAGNOSIS — E782 Mixed hyperlipidemia: Secondary | ICD-10-CM

## 2021-10-06 DIAGNOSIS — I6529 Occlusion and stenosis of unspecified carotid artery: Secondary | ICD-10-CM | POA: Diagnosis not present

## 2021-10-06 DIAGNOSIS — I2510 Atherosclerotic heart disease of native coronary artery without angina pectoris: Secondary | ICD-10-CM

## 2021-10-06 NOTE — Progress Notes (Signed)
Clinical Summary Ms. Gallant is a 80 y.o.female seen today as a new patient for the following medical problems.    1. HTN - compliant with meds - homes SBPs 140s - 10mg  of norvasc causes swelling - prior bradycardia on beta blocker  - on lower benzepril dosing, she had lost significant amount of weight over the last few months, had some issued with low bp's. Also off norvasc.        2. Hyperlipidemia - compliant with crestor - labs followed by pcp   3. OSA     4. Neck pain/SOB/History of CAD - history of prior stent to LAD in 2010 per patient report - 09/2020 nuclear stress [place]: exercised 3 min, chest pain and fatigue with exercise, no EKG ishcemic changes. No ischemia by imaging.    - symptoms started in 08/2020 - walks out mailbox and back, SOB/DOE, +palpitations, pressure both sides of neck similar to prior stent. Rested and symptoms resolve within 1 minute - episodes continued to occur. Last episode 1 month ago.   Mild neck pains with stress or activity, can occur with lifting.        5. Carotid stenosis - followed by vascular 02/2021 carotid US: bilateral mild disease.    6. Gastrostomy tube    Daughter worked [name] worked at [employer], now lives at [employer]       Past Medical History:  Diagnosis Date   Anxiety    CAD (coronary artery disease)    Carotid artery occlusion    Diabetes mellitus without complication (HCC)    Glaucoma    History of hiatal hernia    Hyperlipidemia    Hypertension    Irregular heart beats    Seasonal allergies    Sleep apnea      Allergies  Allergen Reactions   Alendronate     Made bones hurt   Cinoxacin Hives   Contrast Media [Iodinated Diagnostic Agents] Diarrhea    Only kidney ID dyes   Diltiazem     "made her feel like she was in a cloud"   Keflex [Cephalexin] Hives   Naproxen Other (See Comments)    Stomach upset   Nifedipine Other (See Comments)    Unknown reaction   Norfloxacin  Hives   Pravastatin Sodium Other (See Comments)    Stomach cramps   Trimethoprim Hives   Valdecoxib Other (See Comments)    Lower belly pain     Current Outpatient Medications  Medication Sig Dispense Refill   Apple Cider Vinegar 500 MG TABS Take 500 mg by mouth daily.     aspirin 81 MG chewable tablet Chew 81 mg by mouth every evening.     benazepril (LOTENSIN) 10 MG tablet Take 10 mg by mouth every evening.     Cholecalciferol (VITAMIN D) 125 MCG (5000 UT) CAPS Take 5,000 Units by mouth daily. Gummy     Coenzyme Q10 (COQ10) 200 MG CAPS Take 400 mg by mouth daily. Gummy     loratadine (CLARITIN) 10 MG tablet Take 10 mg by mouth every evening.     LORazepam (ATIVAN) 0.5 MG tablet Take 0.5 mg by mouth 3 (three) times daily as needed for anxiety.     Misc Natural Products (ELDERBERRY ZINC/VIT C/IMMUNE MT) Take 1 tablet by mouth daily.     Multiple Vitamins-Minerals (ADULT ONE DAILY GUMMIES) CHEW Chew 2 tablets by mouth daily.     nitroGLYCERIN (NITROSTAT) 0.4 MG SL tablet Place 0.4  mg under the tongue every 5 (five) minutes as needed for chest pain.     ondansetron (ZOFRAN-ODT) 4 MG disintegrating tablet Take 4 mg by mouth every 8 (eight) hours as needed for nausea or vomiting.     RABEprazole (ACIPHEX) 20 MG tablet Take 20 mg by mouth every evening.     rosuvastatin (CRESTOR) 10 MG tablet Take 10 mg by mouth every evening.     Travoprost, BAK Free, (TRAVATAN) 0.004 % SOLN ophthalmic solution Place 1 drop into both eyes at bedtime.     vitamin B-12 (CYANOCOBALAMIN) 1000 MCG tablet Take 1,000 mcg by mouth daily. Gummy     No current facility-administered medications for this visit.     Past Surgical History:  Procedure Laterality Date   ABDOMINAL HYSTERECTOMY     CATARACT EXTRACTION Bilateral    CHOLECYSTECTOMY     COLONOSCOPY  2017   Dr.Williams in [place] , [place]   COLONOSCOPY WITH PROPOFOL N/A 02/06/2021   Procedure: COLONOSCOPY WITH PROPOFOL;  Surgeon: Harvel Quale, MD;  [facility];  Service: Gastroenterology;  Laterality: N/A;  10:30   COLONOSCOPY WITH PROPOFOL N/A 09/29/2021   Procedure: COLONOSCOPY WITH PROPOFOL;  Surgeon: Harvel Quale, MD;  [facility];  Service: Gastroenterology;  Laterality: N/A;  10:05   CORONARY ANGIOPLASTY     stent   ESOPHAGOGASTRODUODENOSCOPY (EGD) WITH PROPOFOL N/A 02/06/2021   Procedure: ESOPHAGOGASTRODUODENOSCOPY (EGD) WITH PROPOFOL;  Surgeon: Harvel Quale, MD;  [facility];  Service: Gastroenterology;  Laterality: N/A;   ESOPHAGOGASTRODUODENOSCOPY (EGD) WITH PROPOFOL N/A 04/17/2021   Procedure: ESOPHAGOGASTRODUODENOSCOPY (EGD) WITH PROPOFOL;  Surgeon: Aviva Signs, MD;  Location: [facility];  Service: General;  Laterality: N/A;   ESOPHAGOGASTRODUODENOSCOPY (EGD) WITH PROPOFOL N/A 05/14/2021   Procedure: ESOPHAGOGASTRODUODENOSCOPY (EGD) WITH PROPOFOL;  Surgeon: Aviva Signs, MD;  Location: [facility];  Service: General;  Laterality: N/A;   heart catherization  5364,6803, 2010   IR GASTROSTOMY TUBE REMOVAL  05/13/2021   PEG PLACEMENT N/A 04/17/2021   Procedure: PERCUTANEOUS ENDOSCOPIC GASTROSTOMY (PEG) PLACEMENT;  Surgeon: Aviva Signs, MD;  Location: [facility];  Service: General;  Laterality: N/A;   PEG PLACEMENT N/A 05/14/2021   Procedure: PERCUTANEOUS ENDOSCOPIC GASTROSTOMY (PEG) PLACEMENT;  Surgeon: Aviva Signs, MD;  Location: [facility];  Service: General;  Laterality: N/A;   POLYPECTOMY  09/29/2021   Procedure: POLYPECTOMY;  Surgeon: Montez Morita, Quillian Quince, MD;  Location: [facility];  Service: Gastroenterology;;   [place]   UPPER GASTROINTESTINAL ENDOSCOPY  2015   Dr.Williams in [place], [place]   VAGINAL HYSTERECTOMY  1991     Allergies  Allergen Reactions   Alendronate     Made bones hurt   Cinoxacin Hives   Contrast Media [Iodinated Diagnostic Agents] Diarrhea    Only kidney ID dyes    Diltiazem     "made her feel like she was in a cloud"   Keflex [Cephalexin] Hives   Naproxen Other (See Comments)    Stomach upset   Nifedipine Other (See Comments)    Unknown reaction   Norfloxacin Hives   Pravastatin Sodium Other (See Comments)    Stomach cramps   Trimethoprim Hives   Valdecoxib Other (See Comments)    Lower belly pain      Family History  Problem Relation Age of Onset   COPD Mother    Heart disease Mother    Heart disease Father  Heart attack Father    Hypertension Sister    Mitral valve prolapse Brother      Social History Ms. Scalf reports that she quit smoking about 58 years ago. Her smoking use included cigarettes. She has never used smokeless tobacco. Ms. Gillott reports no history of alcohol use.   Review of Systems CONSTITUTIONAL: No weight loss, fever, chills, weakness or fatigue.  HEENT: Eyes: No visual loss, blurred vision, double vision or yellow sclerae.No hearing loss, sneezing, congestion, runny nose or sore throat.  SKIN: No rash or itching.  CARDIOVASCULAR: per hpi RESPIRATORY: No shortness of breath, cough or sputum.  GASTROINTESTINAL: No anorexia, nausea, vomiting or diarrhea. No abdominal pain or blood.  GENITOURINARY: No burning on urination, no polyuria NEUROLOGICAL: No headache, dizziness, syncope, paralysis, ataxia, numbness or tingling in the extremities. No change in bowel or bladder control.  MUSCULOSKELETAL: No muscle, back pain, joint pain or stiffness.  LYMPHATICS: No enlarged nodes. No history of splenectomy.  PSYCHIATRIC: No history of depression or anxiety.  ENDOCRINOLOGIC: No reports of sweating, cold or heat intolerance. No polyuria or polydipsia.  Marland Kitchen   Physical Examination Today's Vitals   10/06/21 1344  BP: (!) 150/86  Pulse: 60  SpO2: 97%  Weight: 143 lb 12.8 oz (65.2 kg)  Height: 5' (1.524 m)   Body mass index is 28.08 kg/m.  Gen: resting comfortably, no acute distress HEENT: no scleral icterus,  pupils equal round and reactive, no palptable cervical adenopathy,  CV: RRR, no m/r/g no jvd Resp: Clear to auscultation bilaterally GI: abdomen is soft, non-tender, non-distended, normal bowel sounds, no hepatosplenomegaly MSK: extremities are warm, no edema.  Skin: warm, no rash Neuro:  no focal deficits Psych: appropriate affect   Diagnostic Studies  03/2021 echo 1. Basal inferior/inferoseptal hypokinesis. . Left ventricular ejection  fraction, by estimation, is 50 to 55%. The left ventricle has normal  function. Left ventricular diastolic parameters are indeterminate.   2. Right ventricular systolic function is normal. The right ventricular  size is normal.   3. The mitral valve is normal in structure. Trivial mitral valve  regurgitation.   4. The aortic valve is tricuspid. Aortic valve regurgitation is not  visualized. Mild aortic valve sclerosis is present, with no evidence of  aortic valve stenosis.   5. The inferior vena cava is normal in size with greater than 50%  respiratory variability, suggesting right atrial pressure of 3 mmHg.    Assessment and Plan   1. CAD - no recent chest pains - recent cardiac testing with stress and echo overall benign - continue to monitor.    2. HTN - recent issued with low bp's in settnig of significant weight loss, norvasc stopped and benazepril decreased - bp elevated today, she will call with home bp's in 1 weeks. Pending results would consider adding back low dose norvasc.   3. Hyperlipidemia - continue atorvatatin, request pcp labs   Arnoldo Lenis, M.D.

## 2021-10-06 NOTE — Progress Notes (Signed)
Subjective:     Melinda Acevedo  Patient here for follow-up of granulomatous tissue around the PEG site.  Occasional bleeding is noted.  She has been putting antibiotic and steroid cream on the granulation tissue. Objective:    BP (!) 160/72   Pulse 67   Temp 98.6 F (37 C) (Other (Comment))   Resp 12   Ht 5' (1.524 m)   Wt 143 lb (64.9 kg)   SpO2 93%   BMI 27.93 kg/m   General:  alert, cooperative, and no distress  PEG site with granulation tissue noted inferiorly.  Silver nitrate applied.  Tolerated procedure well.     Assessment:    Granulomatous tissue around PEG site    Plan:   Continue current PEG site care.  Follow-up as needed.

## 2021-10-06 NOTE — Patient Instructions (Addendum)
Medication Instructions:  Continue all current medications.  Labwork: none  Testing/Procedures: none  Follow-Up: Your physician wants you to follow up in: 6 months.  You will receive a reminder letter in the mail one-two months in advance.  If you don't receive a letter, please call our office to schedule the follow up appointment   Any Other Special Instructions Will Be Listed Below (If Applicable). Please call the office with log of blood pressure readings in 1 week.   If you need a refill on your cardiac medications before your next appointment, please call your pharmacy.

## 2021-10-08 ENCOUNTER — Encounter: Payer: Self-pay | Admitting: *Deleted

## 2021-10-23 ENCOUNTER — Telehealth: Payer: Self-pay | Admitting: Cardiology

## 2021-10-23 NOTE — Telephone Encounter (Signed)
Fwd to Dr. Harl Bowie for review.

## 2021-10-23 NOTE — Telephone Encounter (Signed)
Patient calling with BP Readings:   10/31: 156/72 highest, 10/28: 139/74 10/26: 147/73 10/24:122/73 lowest 10/23: 134/71 10/20: 134/71 10/19: 138/73  Patient states her BP is usually 130-140's and occasional will get up to the 150's

## 2021-10-26 MED ORDER — BENAZEPRIL HCL 10 MG PO TABS: 20.0000 mg | ORAL_TABLET | Freq: Every day | ORAL | Status: DC

## 2021-10-26 NOTE — Telephone Encounter (Signed)
Patient notified and verbalized understanding.   She will call with update in 2-3 weeks.    She will take 2 tabs of the 10mg  Benazepril for now.

## 2021-10-26 NOTE — Telephone Encounter (Signed)
BP's too high, would increase benazepril to 20mg  daily   Zandra Abts MD

## 2021-11-09 NOTE — Progress Notes (Signed)
BMP was ordered by anesthesia for preop evaluation

## 2021-11-10 ENCOUNTER — Telehealth: Payer: Self-pay | Admitting: Cardiology

## 2021-11-10 DIAGNOSIS — I10 Essential (primary) hypertension: Secondary | ICD-10-CM

## 2021-11-10 DIAGNOSIS — Z79899 Other long term (current) drug therapy: Secondary | ICD-10-CM

## 2021-11-10 NOTE — Telephone Encounter (Signed)
Pt calling in to report blood pressure readings for the past week:  11/16:  154/72 11/17:  118/67 11/18:  150/63 11/19:  110/67 11/20:  153/99 11/21:  152/62 11/22:  hasnt taken today

## 2021-11-10 NOTE — Telephone Encounter (Signed)
BP's still too high, increase benazepril to 40mg  daily, needs bmet in 1 week  Zandra Abts MD

## 2021-11-11 ENCOUNTER — Other Ambulatory Visit: Payer: Self-pay | Admitting: *Deleted

## 2021-11-11 DIAGNOSIS — I10 Essential (primary) hypertension: Secondary | ICD-10-CM

## 2021-11-11 DIAGNOSIS — Z79899 Other long term (current) drug therapy: Secondary | ICD-10-CM

## 2021-11-11 MED ORDER — BENAZEPRIL HCL 40 MG PO TABS: 40.0000 mg | ORAL_TABLET | Freq: Every day | ORAL | 1 refills | Status: DC

## 2021-11-11 NOTE — Telephone Encounter (Signed)
Patient informed and verbalized understanding of plan. Order faxed to [facility] in [place] (fax # [phone])

## 2021-11-24 ENCOUNTER — Telehealth: Payer: Self-pay | Admitting: Cardiology

## 2021-11-24 MED ORDER — AMLODIPINE BESYLATE 5 MG PO TABS: 5.0000 mg | ORAL_TABLET | Freq: Every day | ORAL | 3 refills | Status: DC

## 2021-11-24 NOTE — Telephone Encounter (Signed)
Can start norvasc 5mg  daily  Zandra Abts MD

## 2021-11-24 NOTE — Telephone Encounter (Signed)
157/76, 151/67, 149/68, 144/71, 148/70, 156/84, 166/84,    Patient mentioned she had been on amlodipine before    She did admit to being able to eat some salty foods when her BP was normal. She then states she cut out salty foods last week. I explained to her that even with normal bp's she should not eat salty foods with a diagnosis of HTN      I will message Dr.Branch

## 2021-11-24 NOTE — Telephone Encounter (Signed)
Pt verbalized and understanding of medication addition. Medication list updated to reflect changes made.

## 2021-11-24 NOTE — Telephone Encounter (Signed)
Pt c/o medication issue:  1. Name of Medication: benazepril (LOTENSIN) 40 MG tablet  2. How are you currently taking this medication (dosage and times per day)? Take 1 tablet (40 mg total) by mouth daily.   3. Are you having a reaction (difficulty breathing--STAT)? No   4. What is your medication issue?  pt states that the change in dosage has not helped her blood pressure.. please advise of next steps.

## 2021-12-23 ENCOUNTER — Other Ambulatory Visit: Payer: Self-pay | Admitting: *Deleted

## 2021-12-23 DIAGNOSIS — D1720 Benign lipomatous neoplasm of skin and subcutaneous tissue of unspecified limb: Secondary | ICD-10-CM

## 2021-12-29 ENCOUNTER — Ambulatory Visit: Payer: Medicare Other | Admitting: General Surgery

## 2022-01-12 ENCOUNTER — Other Ambulatory Visit: Payer: Self-pay

## 2022-01-12 ENCOUNTER — Ambulatory Visit (INDEPENDENT_AMBULATORY_CARE_PROVIDER_SITE_OTHER): Payer: Medicare Other | Admitting: General Surgery

## 2022-01-12 ENCOUNTER — Encounter: Payer: Self-pay | Admitting: General Surgery

## 2022-01-12 VITALS — BP 150/73 | HR 68 | Temp 97.6°F | Resp 12 | Ht 60.0 in | Wt 143.0 lb

## 2022-01-12 DIAGNOSIS — D1724 Benign lipomatous neoplasm of skin and subcutaneous tissue of left leg: Secondary | ICD-10-CM | POA: Diagnosis not present

## 2022-01-13 NOTE — Progress Notes (Signed)
Jim Philemon; [id number]; 01-04-1941   HPI Patient is a 81 year old white female who returns to my care for evaluation and treatment of a subcutaneous nodule in the left thigh.  Patient states has been present for some time but is becoming more tender to touch.  She did have an ultrasound of this region and was found to have a small lipoma in the subcutaneous tissue.  It seems to have increased in size as per the patient.  Her PEG tube continues to work well.  She has had no recent issues with it. Past Medical History:  Diagnosis Date   Anxiety    CAD (coronary artery disease)    Carotid artery occlusion    Diabetes mellitus without complication (HCC)    Glaucoma    History of hiatal hernia    Hyperlipidemia    Hypertension    Irregular heart beats    Seasonal allergies    Sleep apnea     Past Surgical History:  Procedure Laterality Date   ABDOMINAL HYSTERECTOMY     CATARACT EXTRACTION Bilateral    CHOLECYSTECTOMY     COLONOSCOPY  2017   Dr.Williams in [place] , [place]   COLONOSCOPY WITH PROPOFOL N/A 02/06/2021   Procedure: COLONOSCOPY WITH PROPOFOL;  Surgeon: Harvel Quale, MD;  [facility];  Service: Gastroenterology;  Laterality: N/A;  10:30   COLONOSCOPY WITH PROPOFOL N/A 09/29/2021   Procedure: COLONOSCOPY WITH PROPOFOL;  Surgeon: Harvel Quale, MD;  [facility];  Service: Gastroenterology;  Laterality: N/A;  10:05   CORONARY ANGIOPLASTY     stent   ESOPHAGOGASTRODUODENOSCOPY (EGD) WITH PROPOFOL N/A 02/06/2021   Procedure: ESOPHAGOGASTRODUODENOSCOPY (EGD) WITH PROPOFOL;  Surgeon: Harvel Quale, MD;  [facility];  Service: Gastroenterology;  Laterality: N/A;   ESOPHAGOGASTRODUODENOSCOPY (EGD) WITH PROPOFOL N/A 04/17/2021   Procedure: ESOPHAGOGASTRODUODENOSCOPY (EGD) WITH PROPOFOL;  Surgeon: Aviva Signs, MD;  Location: [facility];  Service: General;  Laterality: N/A;   ESOPHAGOGASTRODUODENOSCOPY (EGD) WITH  PROPOFOL N/A 05/14/2021   Procedure: ESOPHAGOGASTRODUODENOSCOPY (EGD) WITH PROPOFOL;  Surgeon: Aviva Signs, MD;  Location: [facility];  Service: General;  Laterality: N/A;   heart catherization  8938,1017, 2010   IR GASTROSTOMY TUBE REMOVAL  05/13/2021   PEG PLACEMENT N/A 04/17/2021   Procedure: PERCUTANEOUS ENDOSCOPIC GASTROSTOMY (PEG) PLACEMENT;  Surgeon: Aviva Signs, MD;  Location: [facility];  Service: General;  Laterality: N/A;   PEG PLACEMENT N/A 05/14/2021   Procedure: PERCUTANEOUS ENDOSCOPIC GASTROSTOMY (PEG) PLACEMENT;  Surgeon: Aviva Signs, MD;  Location: [facility];  Service: General;  Laterality: N/A;   POLYPECTOMY  09/29/2021   Procedure: POLYPECTOMY;  Surgeon: Montez Morita, Quillian Quince, MD;  Location[facility];  Service: Gastroenterology;;   [place]   UPPER GASTROINTESTINAL ENDOSCOPY  2015   Dr.Williams in [place], [place]    Family History  Problem Relation Age of Onset   COPD Mother    Heart disease Mother    Heart disease Father    Heart attack Father    Hypertension Sister    Mitral valve prolapse Brother     Current Outpatient Medications on File Prior to Visit  Medication Sig Dispense Refill   amLODipine (NORVASC) 5 MG tablet Take 1 tablet (5 mg total) by mouth daily. 90 tablet 3   Apple Cider Vinegar 500 MG TABS Take 500 mg by mouth daily.     aspirin 81  MG chewable tablet Chew 81 mg by mouth every evening.     benazepril (LOTENSIN) 40 MG tablet Take 1 tablet (40 mg total) by mouth daily. 90 tablet 1   Cholecalciferol (VITAMIN D) 125 MCG (5000 UT) CAPS Take 5,000 Units by mouth daily. Gummy     Coenzyme Q10 (COQ10) 200 MG CAPS Take 400 mg by mouth daily. Gummy     loratadine (CLARITIN) 10 MG tablet Take 10 mg by mouth every evening.     LORazepam (ATIVAN) 0.5 MG tablet Take 0.5 mg by mouth 3 (three) times daily as needed for anxiety.     Misc Natural Products (ELDERBERRY ZINC/VIT  C/IMMUNE MT) Take 1 tablet by mouth daily.     Multiple Vitamins-Minerals (ADULT ONE DAILY GUMMIES) CHEW Chew 2 tablets by mouth daily.     nitroGLYCERIN (NITROSTAT) 0.4 MG SL tablet Place 0.4 mg under the tongue every 5 (five) minutes as needed for chest pain.     ondansetron (ZOFRAN-ODT) 4 MG disintegrating tablet Take 4 mg by mouth every 8 (eight) hours as needed for nausea or vomiting.     RABEprazole (ACIPHEX) 20 MG tablet Take 20 mg by mouth every evening.     rosuvastatin (CRESTOR) 10 MG tablet Take 10 mg by mouth every evening.     Travoprost, BAK Free, (TRAVATAN) 0.004 % SOLN ophthalmic solution Place 1 drop into both eyes at bedtime.     vitamin B-12 (CYANOCOBALAMIN) 1000 MCG tablet Take 1,000 mcg by mouth daily. Gummy     No current facility-administered medications on file prior to visit.    Allergies  Allergen Reactions   Alendronate     Made bones hurt   Cinoxacin Hives   Contrast Media [Iodinated Contrast Media] Diarrhea    Only kidney ID dyes   Diltiazem     "made her feel like she was in a cloud"   Keflex [Cephalexin] Hives   Naproxen Other (See Comments)    Stomach upset   Nifedipine Other (See Comments)    Unknown reaction   Norfloxacin Hives   Pravastatin Sodium Other (See Comments)    Stomach cramps   Trimethoprim Hives   Valdecoxib Other (See Comments)    Lower belly pain    Social History   Substance and Sexual Activity  Alcohol Use No    Social History   Tobacco Use  Smoking Status Former   Years: 2.00   Types: Cigarettes   Quit date: 1964   Years since quitting: 59.1  Smokeless Tobacco Never    Review of Systems  Constitutional: Negative.   HENT: Negative.    Eyes: Negative.   Respiratory: Negative.    Cardiovascular: Negative.   Gastrointestinal: Negative.   Genitourinary: Negative.   Musculoskeletal: Negative.   Skin: Negative.   Endo/Heme/Allergies: Negative.   Psychiatric/Behavioral: Negative.     Objective   Vitals:    01/12/22 1259  BP: (!) 150/73  Pulse: 68  Resp: 12  Temp: 97.6 F (36.4 C)  SpO2: 95%    Physical Exam Vitals reviewed.  Constitutional:      Appearance: Normal appearance. She is normal weight. She is not ill-appearing.  HENT:     Head: Normocephalic and atraumatic.  Cardiovascular:     Rate and Rhythm: Normal rate and regular rhythm.     Heart sounds: Normal heart sounds. No murmur heard.   No friction rub. No gallop.  Pulmonary:     Effort: Pulmonary effort is normal. No respiratory distress.  Breath sounds: Normal breath sounds. No stridor. No wheezing, rhonchi or rales.  Musculoskeletal:     Comments: 3 cm mobile rubbery oval subcutaneous mass noted in upper lateral left thigh.  It is tender to touch.  Skin:    General: Skin is warm and dry.  Neurological:     Mental Status: She is alert and oriented to person, place, and time.   Primary care notes reviewed Assessment  Lipoma of left thigh Plan  I told the patient that I be more than happy to remove the subcutaneous mass given its change in size and tenderness.  She will call to schedule the surgery.  The risks and benefits of the procedure were fully explained to the patient, who gave informed consent.

## 2022-04-09 ENCOUNTER — Encounter: Payer: Self-pay | Admitting: Cardiology

## 2022-04-09 ENCOUNTER — Ambulatory Visit (INDEPENDENT_AMBULATORY_CARE_PROVIDER_SITE_OTHER): Payer: Medicare Other | Admitting: Cardiology

## 2022-04-09 VITALS — BP 138/70 | HR 71 | Ht 60.0 in | Wt 150.2 lb

## 2022-04-09 DIAGNOSIS — I10 Essential (primary) hypertension: Secondary | ICD-10-CM | POA: Diagnosis not present

## 2022-04-09 DIAGNOSIS — I2510 Atherosclerotic heart disease of native coronary artery without angina pectoris: Secondary | ICD-10-CM | POA: Diagnosis not present

## 2022-04-09 DIAGNOSIS — E782 Mixed hyperlipidemia: Secondary | ICD-10-CM | POA: Diagnosis not present

## 2022-04-09 NOTE — Progress Notes (Signed)
? ? ? ?Clinical Summary ?Melinda Acevedo is a 81 y.o.female seen today for follow up of the following medical problems ? ? ?1. HTN ?- compliant with meds ?- '10mg'$  of norvasc causes swelling, tolerated '5mg'$  daily ?- prior bradycardia on beta blocker ?- prior issues with intermittent low bp's, in general accepting higher bp's for her ? ?  ?  ?  ?  ?2. Hyperlipidemia ?- compliant with crestor ?-most recent labs with pcp ?  ?3. OSA ?  ?  ?4. Neck pain/SOB/History of CAD ?- history of prior stent to LAD in 2010 per patient report ?- 09/2020 nuclear stress [place]: exercised 3 min, chest pain and fatigue with exercise, no EKG ishcemic changes. No ischemia by imaging.  ?  ?- symptoms started in 08/2020 ?- walks out mailbox and back, SOB/DOE, +palpitations, pressure both sides of neck similar to prior stent. Rested and symptoms resolve within 1 minute ?- episodes continued to occur. Last episode 1 month ago.  ?  ?  ? - no recent symptoms ? ? ?  ?5. Carotid stenosis ?- followed by vascular ?02/2021 carotid US: bilateral mild disease.  ?  ?6. Gastrostomy tube ?  ?  ?Daughter worked Melinda Acevedo worked at [employer], now lives at [facility] ?Past Medical History:  ?Diagnosis Date  ? Anxiety   ? CAD (coronary artery disease)   ? Carotid artery occlusion   ? Diabetes mellitus without complication ([place])   ? Glaucoma   ? History of hiatal hernia   ? Hyperlipidemia   ? Hypertension   ? Irregular heart beats   ? Seasonal allergies   ? Sleep apnea   ? ? ? ?Allergies  ?Allergen Reactions  ? Alendronate   ?  Made bones hurt  ? Cinoxacin Hives  ? Contrast Media [Iodinated Contrast Media] Diarrhea  ?  Only kidney ID dyes  ? Diltiazem   ?  "made her feel like she was in a cloud"  ? Keflex [Cephalexin] Hives  ? Naproxen Other (See Comments)  ?  Stomach upset  ? Nifedipine Other (See Comments)  ?  Unknown reaction  ? Norfloxacin Hives  ? Pravastatin Sodium Other (See Comments)  ?  Stomach cramps  ? Trimethoprim Hives  ? Valdecoxib Other (See  Comments)  ?  Lower belly pain  ? ? ? ?Current Outpatient Medications  ?Medication Sig Dispense Refill  ? amLODipine (NORVASC) 5 MG tablet Take 1 tablet (5 mg total) by mouth daily. 90 tablet 3  ? Apple Cider Vinegar 500 MG TABS Take 500 mg by mouth daily.    ? aspirin 81 MG chewable tablet Chew 81 mg by mouth every evening.    ? benazepril (LOTENSIN) 40 MG tablet Take 1 tablet (40 mg total) by mouth daily. 90 tablet 1  ? Cholecalciferol (VITAMIN D) 125 MCG (5000 UT) CAPS Take 5,000 Units by mouth daily. Gummy    ? Coenzyme Q10 (COQ10) 200 MG CAPS Take 400 mg by mouth daily. Gummy    ? loratadine (CLARITIN) 10 MG tablet Take 10 mg by mouth every evening.    ? LORazepam (ATIVAN) 0.5 MG tablet Take 0.5 mg by mouth 3 (three) times daily as needed for anxiety.    ? LORazepam (ATIVAN) 0.5 MG tablet Take by mouth.    ? Misc Natural Products (ELDERBERRY ZINC/VIT C/IMMUNE MT) Take 1 tablet by mouth daily.    ? Multiple Vitamins-Minerals (ADULT ONE DAILY GUMMIES) CHEW Chew 2 tablets by mouth daily.    ?  nitroGLYCERIN (NITROSTAT) 0.4 MG SL tablet Place 0.4 mg under the tongue every 5 (five) minutes as needed for chest pain.    ? ondansetron (ZOFRAN-ODT) 4 MG disintegrating tablet Take 4 mg by mouth every 8 (eight) hours as needed for nausea or vomiting.    ? RABEprazole (ACIPHEX) 20 MG tablet Take 20 mg by mouth every evening.    ? rosuvastatin (CRESTOR) 10 MG tablet Take 10 mg by mouth every evening.    ? Travoprost, BAK Free, (TRAVATAN) 0.004 % SOLN ophthalmic solution Place 1 drop into both eyes at bedtime.    ? vitamin B-12 (CYANOCOBALAMIN) 1000 MCG tablet Take 1,000 mcg by mouth daily. Gummy    ? ?No current facility-administered medications for this visit.  ? ? ? ?Past Surgical History:  ?Procedure Laterality Date  ? ABDOMINAL HYSTERECTOMY    ? CATARACT EXTRACTION Bilateral   ? CHOLECYSTECTOMY    ? COLONOSCOPY  2017  ? Dr.Williams in [place] , [place]  ? COLONOSCOPY WITH PROPOFOL N/A 02/06/2021  ? Procedure: COLONOSCOPY  WITH PROPOFOL;  Surgeon: Harvel Quale, MD;  [facility];  Service: Gastroenterology;  Laterality: N/A;  10:30  ? COLONOSCOPY WITH PROPOFOL N/A 09/29/2021  ? Procedure: COLONOSCOPY WITH PROPOFOL;  Surgeon: Harvel Quale, MD;  [facility];  Service: Gastroenterology;  Laterality: N/A;  10:05  ? CORONARY ANGIOPLASTY    ? stent  ? ESOPHAGOGASTRODUODENOSCOPY (EGD) WITH PROPOFOL N/A 02/06/2021  ? Procedure: ESOPHAGOGASTRODUODENOSCOPY (EGD) WITH PROPOFOL;  Surgeon: Harvel Quale, MD;  [facility];  Service: Gastroenterology;  Laterality: N/A;  ? ESOPHAGOGASTRODUODENOSCOPY (EGD) WITH PROPOFOL N/A 04/17/2021  ? Procedure: ESOPHAGOGASTRODUODENOSCOPY (EGD) WITH PROPOFOL;  Surgeon: Aviva Signs, MD;  [facility];  Service: General;  Laterality: N/A;  ? ESOPHAGOGASTRODUODENOSCOPY (EGD) WITH PROPOFOL N/A 05/14/2021  ? Procedure: ESOPHAGOGASTRODUODENOSCOPY (EGD) WITH PROPOFOL;  Surgeon: Aviva Signs, MD;  [facility]: [facility];  Service: General;  Laterality: N/A;  ? heart catherization  0254,2706, 2010  ? IR GASTROSTOMY TUBE REMOVAL  05/13/2021  ? PEG PLACEMENT N/A 04/17/2021  ? Procedure: PERCUTANEOUS ENDOSCOPIC GASTROSTOMY (PEG) PLACEMENT;  Surgeon: Aviva Signs, MD;  Location: [facility];  Service: General;  Laterality: N/A;  ? PEG PLACEMENT N/A 05/14/2021  ? Procedure: PERCUTANEOUS ENDOSCOPIC GASTROSTOMY (PEG) PLACEMENT;  Surgeon: Aviva Signs, MD;  Location: [facility];  Service: General;  Laterality: N/A;  ? POLYPECTOMY  09/29/2021  ? Procedure: POLYPECTOMY;  Surgeon: Harvel Quale, MD;  [facility];  Service: Gastroenterology;;  ? [place]    ? TONSILLECTOMY    ? TUBAL LIGATION  1986  ? UPPER GASTROINTESTINAL ENDOSCOPY  2015  ? Dr.Williams in [place], [place]  ? VAGINAL HYSTERECTOMY  1991  ? ? ? ?Allergies  ?Allergen Reactions  ? Alendronate   ?  Made bones hurt  ? Cinoxacin Hives  ? Contrast Media [Iodinated Contrast Media]  Diarrhea  ?  Only kidney ID dyes  ? Diltiazem   ?  "made her feel like she was in a cloud"  ? Keflex [Cephalexin] Hives  ? Naproxen Other (See Comments)  ?  Stomach upset  ? Nifedipine Other (See Comments)  ?  Unknown reaction  ? Norfloxacin Hives  ? Pravastatin Sodium Other (See Comments)  ?  Stomach cramps  ? Trimethoprim Hives  ? Valdecoxib Other (See Comments)  ?  Lower belly pain  ? ? ? ? ?Family History  ?Problem Relation Age of Onset  ? COPD Mother   ? Heart disease  Mother   ? Heart disease Father   ? Heart attack Father   ? Hypertension Sister   ? Mitral valve prolapse Brother   ? ? ? ?Social History ?Melinda Acevedo reports that she quit smoking about 59 years ago. Her smoking use included cigarettes. She has never used smokeless tobacco. ?Melinda Acevedo reports no history of alcohol use. ? ? ?Review of Systems ?CONSTITUTIONAL: No weight loss, fever, chills, weakness or fatigue.  ?HEENT: Eyes: No visual loss, blurred vision, double vision or yellow sclerae.No hearing loss, sneezing, congestion, runny nose or sore throat.  ?SKIN: No rash or itching.  ?CARDIOVASCULAR: per hpi ?RESPIRATORY: No shortness of breath, cough or sputum.  ?GASTROINTESTINAL: No anorexia, nausea, vomiting or diarrhea. No abdominal pain or blood.  ?GENITOURINARY: No burning on urination, no polyuria ?NEUROLOGICAL: No headache, dizziness, syncope, paralysis, ataxia, numbness or tingling in the extremities. No change in bowel or bladder control.  ?MUSCULOSKELETAL: No muscle, back pain, joint pain or stiffness.  ?LYMPHATICS: No enlarged nodes. No history of splenectomy.  ?PSYCHIATRIC: No history of depression or anxiety.  ?ENDOCRINOLOGIC: No reports of sweating, cold or heat intolerance. No polyuria or polydipsia.  ?. ? ? ?Physical Examination ?Today's Vitals  ? 04/09/22 1149 04/09/22 1215  ?BP: 140/66 138/70  ?Pulse: 71   ?SpO2: 96%   ?Weight: 150 lb 3.2 oz (68.1 kg)   ?Height: 5' (1.524 m)   ? ?Body mass index is 29.33 kg/m?. ? ?Gen: resting  comfortably, no acute distress ?HEENT: no scleral icterus, pupils equal round and reactive, no palptable cervical adenopathy,  ?CV: RRR, no m/r/g, no jvd ?Resp: Clear to auscultation bilaterally ?GI: abdomen is

## 2022-04-09 NOTE — Patient Instructions (Addendum)
Follow-Up: ?Follow up with Dr. Harl Bowie in 1 year.  ? ?Any Other Special Instructions Will Be Listed Below (If Applicable). ? ? ? ? ?If you need a refill on your cardiac medications before your next appointment, please call your pharmacy. ? ?

## 2022-05-03 ENCOUNTER — Other Ambulatory Visit: Payer: Self-pay | Admitting: Cardiology

## 2022-10-21 ENCOUNTER — Encounter: Payer: Self-pay | Admitting: General Surgery

## 2022-10-21 ENCOUNTER — Ambulatory Visit (INDEPENDENT_AMBULATORY_CARE_PROVIDER_SITE_OTHER): Payer: Medicare Other | Admitting: General Surgery

## 2022-10-21 VITALS — BP 155/75 | HR 63 | Temp 97.8°F | Resp 16 | Ht 60.0 in | Wt 158.0 lb

## 2022-10-21 DIAGNOSIS — I2510 Atherosclerotic heart disease of native coronary artery without angina pectoris: Secondary | ICD-10-CM

## 2022-10-21 DIAGNOSIS — K9429 Other complications of gastrostomy: Secondary | ICD-10-CM

## 2022-10-21 NOTE — Progress Notes (Signed)
Subjective:     Melinda Acevedo  Patient here for leakage and irritation around the PEG tube site.  She states this has been occurring for the past few weeks.  She has spontaneous leakage of gastric contents around the PEG site.  Otherwise, the PEG is working well. Objective:    BP (!) 155/75   Pulse 63   Temp 97.8 F (36.6 C) (Other (Comment))   Resp 16   Ht 5' (1.524 m)   Wt 158 lb (71.7 kg)   SpO2 94%   BMI 30.86 kg/m   General:  alert, cooperative, and no distress  Abdomen is soft.  Surrounding skin irritation which is superficial in nature is noted around the PEG site.  The bolster was noted to be at the 5 cm Layce Sprung and was tightened to the 2-1/2 cm Mariska Daffin.  It was then flushed and adequate positioning was confirmed.     Assessment:    PEG tube cellulitis and malfunction, resolved    Plan:   Patient to follow-up with me as needed for PEG tube issues.

## 2022-11-04 ENCOUNTER — Telehealth: Payer: Self-pay | Admitting: *Deleted

## 2022-11-04 NOTE — Telephone Encounter (Signed)
Received call from patient [phone] telephone.   Patient reports irritation surrounding PEG tube with discomfort on the internal end of the incision. States that she has known granulation tissue on the external skin, but pain is now noted internally. Also reports scant amount of bright red blood surrounding tube.   Patient reports that she has been using ABTx cream and hydrocortisone cream for surrounding skin.   Dr. Arnoldo Morale reviewed and recommends stopping Hydrocortisone cream and replacing with OTC Lidocaine cream.   Call placed to patient and patient made aware. Verbalized understanding

## 2022-11-09 ENCOUNTER — Encounter: Payer: Self-pay | Admitting: General Surgery

## 2022-11-09 ENCOUNTER — Ambulatory Visit (INDEPENDENT_AMBULATORY_CARE_PROVIDER_SITE_OTHER): Payer: Medicare Other | Admitting: General Surgery

## 2022-11-09 VITALS — BP 150/81 | HR 79 | Temp 98.3°F | Resp 16 | Ht 60.0 in | Wt 152.0 lb

## 2022-11-09 DIAGNOSIS — I2510 Atherosclerotic heart disease of native coronary artery without angina pectoris: Secondary | ICD-10-CM

## 2022-11-09 DIAGNOSIS — K9423 Gastrostomy malfunction: Secondary | ICD-10-CM

## 2022-11-09 NOTE — Progress Notes (Signed)
Subjective:     Melinda Acevedo  Patient presents with issues with her peg tube.  She has been leaking gastric contents and has not been able to fully flush in her liquids or medicines through the tube.  She does have some swelling and tenderness around the tube. Objective:    BP (!) 150/81   Pulse 79   Temp 98.3 F (36.8 C) (Oral)   Resp 16   Ht 5' (1.524 m)   Wt 152 lb (68.9 kg)   SpO2 95%   BMI 29.69 kg/m   General:  alert, cooperative, and no distress  Head is normocephalic, atraumatic Lungs clear to auscultation with equal breath sounds bilaterally Heart examination reveals regular rate rhythm without S3, S4, murmurs Abdomen is soft.  There is a significant amount of granulation tissue and erythema at the PEG site.  I think the PEG mushroom may be displaced in the abdominal wall.  She does have gastric contents coming out around the gastrostomy tube.     Assessment:    Dysfunctional PEG tube    Plan:   I will take the patient to the endoscopy suite tomorrow for a PEG tube change.  The risks and benefits of the procedure including bleeding, infection, and the possibility of not being able to place the gastrostomy tube were fully explained to the patient, who gave informed consent.

## 2022-11-09 NOTE — H&P (Signed)
Melinda Acevedo is an 81 y.o. female.   Chief Complaint: Dysfunctional PEG tube HPI: Patient is an 81 year old white female who presented to my office today with trouble with her PEG tube.  She has had gastric contents and fluid leaking around the gastrostomy tube.  It does not seem to be flushing well.  Past Medical History:  Diagnosis Date   Anxiety    CAD (coronary artery disease)    Carotid artery occlusion    Diabetes mellitus without complication (HCC)    Glaucoma    History of hiatal hernia    Hyperlipidemia    Hypertension    Irregular heart beats    Seasonal allergies    Sleep apnea     Past Surgical History:  Procedure Laterality Date   ABDOMINAL HYSTERECTOMY     CATARACT EXTRACTION Bilateral    CHOLECYSTECTOMY     COLONOSCOPY  2017   Dr.Williams in [place] , [place]   COLONOSCOPY WITH PROPOFOL N/A 02/06/2021   Procedure: COLONOSCOPY WITH PROPOFOL;  Surgeon: Harvel Quale, MD;  [facility];  Service: Gastroenterology;  Laterality: N/A;  10:30   COLONOSCOPY WITH PROPOFOL N/A 09/29/2021   Procedure: COLONOSCOPY WITH PROPOFOL;  Surgeon: Harvel Quale, MD;  [facility];  Service: Gastroenterology;  Laterality: N/A;  10:05   CORONARY ANGIOPLASTY     stent   ESOPHAGOGASTRODUODENOSCOPY (EGD) WITH PROPOFOL N/A 02/06/2021   Procedure: ESOPHAGOGASTRODUODENOSCOPY (EGD) WITH PROPOFOL;  Surgeon: Harvel Quale, MD;  [facility];  Service: Gastroenterology;  Laterality: N/A;   ESOPHAGOGASTRODUODENOSCOPY (EGD) WITH PROPOFOL N/A 04/17/2021   Procedure: ESOPHAGOGASTRODUODENOSCOPY (EGD) WITH PROPOFOL;  Surgeon: Aviva Signs, MD;  Location: [facility];  Service: General;  Laterality: N/A;   ESOPHAGOGASTRODUODENOSCOPY (EGD) WITH PROPOFOL N/A 05/14/2021   Procedure: ESOPHAGOGASTRODUODENOSCOPY (EGD) WITH PROPOFOL;  Surgeon: Aviva Signs, MD;  Location: [facility];  Service: General;  Laterality: N/A;   heart catherization   [id number], 2010   IR GASTROSTOMY TUBE REMOVAL  05/13/2021   PEG PLACEMENT N/A 04/17/2021   Procedure: PERCUTANEOUS ENDOSCOPIC GASTROSTOMY (PEG) PLACEMENT;  Surgeon: Aviva Signs, MD;  Location: [facility];  Service: General;  Laterality: N/A;   PEG PLACEMENT N/A 05/14/2021   Procedure: PERCUTANEOUS ENDOSCOPIC GASTROSTOMY (PEG) PLACEMENT;  Surgeon: Aviva Signs, MD;  Location: [facility];  Service: General;  Laterality: N/A;   POLYPECTOMY  09/29/2021   Procedure: POLYPECTOMY;  Surgeon: Harvel Quale, MD;  [facility];  Service: Gastroenterology;;   [place]   UPPER GASTROINTESTINAL ENDOSCOPY  2015   Dr.Williams in [place], [place]   VAGINAL HYSTERECTOMY  1991    Family History  Problem Relation Age of Onset   COPD Mother    Heart disease Mother    Heart disease Father    Heart attack Father    Hypertension Sister    Mitral valve prolapse Brother    Social History:  reports that she quit smoking about 59 years ago. Her smoking use included cigarettes. She has never used smokeless tobacco. She reports that she does not drink alcohol and does not use drugs.  Allergies:  Allergies  Allergen Reactions   Alendronate     Made bones hurt   Cinoxacin Hives   Contrast Media [Iodinated Contrast Media] Diarrhea    Only kidney ID dyes   Diltiazem     "made her feel like she was in a cloud"   Keflex [Cephalexin] Hives  Naproxen Other (See Comments)    Stomach upset   Nifedipine Other (See Comments)    Unknown reaction   Norfloxacin Hives   Pravastatin Sodium Other (See Comments)    Stomach cramps   Trimethoprim Hives   Valdecoxib Other (See Comments)    Lower belly pain    No medications prior to admission.    No results found for this or any previous visit (from the past 48 hour(s)). No results found.  Review of Systems  Constitutional:  Positive for fatigue.  HENT: Negative.    Respiratory: Negative.     Cardiovascular: Negative.   Gastrointestinal:  Positive for abdominal pain.  Endocrine: Negative.   Genitourinary: Negative.   Musculoskeletal: Negative.   Allergic/Immunologic: Negative.   Neurological: Negative.   Hematological: Negative.     There were no vitals taken for this visit. Physical Exam Vitals reviewed.  Constitutional:      Appearance: Normal appearance. She is not ill-appearing.  HENT:     Head: Normocephalic and atraumatic.  Cardiovascular:     Rate and Rhythm: Normal rate and regular rhythm.     Heart sounds: Normal heart sounds. No murmur heard.    No friction rub. No gallop.  Pulmonary:     Effort: Pulmonary effort is normal. No respiratory distress.     Breath sounds: Normal breath sounds. No stridor. No wheezing, rhonchi or rales.  Abdominal:     General: Abdomen is flat. There is no distension.     Palpations: Abdomen is soft. There is no mass.     Tenderness: There is no abdominal tenderness. There is no guarding or rebound.     Hernia: No hernia is present.     Comments: PEG tube with erythema and granulation tissue at the skin level.  The bolster is at the 2 cm Ismeal Heider.  It appears that I may be able to feel part of the mushroom bulb just underneath the skin.  There is gastric contents coming around it.  Skin:    General: Skin is warm and dry.  Neurological:     Mental Status: She is alert and oriented to person, place, and time.      Assessment/Plan Impression: Dysfunctional PEG tube Plan: Patient is scheduled for PEG tube change tomorrow.  The risks and benefits of the procedure including bleeding, infection, and the possibility of needing interventional radiology to replace the tube were fully explained to the patient, who gave informed consent.  Aviva Signs, MD 11/09/2022, 12:32 PM

## 2022-11-10 ENCOUNTER — Ambulatory Visit (HOSPITAL_COMMUNITY)
Admission: RE | Admit: 2022-11-10 | Discharge: 2022-11-10 | Disposition: A | Discharge status: Home / Self Care | Payer: Medicare Other | Attending: General Surgery | Admitting: General Surgery

## 2022-11-10 ENCOUNTER — Encounter (HOSPITAL_COMMUNITY)
Admission: RE | Disposition: A | Discharge status: Home / Self Care | Payer: Self-pay | Source: Home / Self Care | Attending: General Surgery

## 2022-11-10 ENCOUNTER — Encounter (HOSPITAL_COMMUNITY): Payer: Self-pay | Admitting: General Surgery

## 2022-11-10 DIAGNOSIS — K9423 Gastrostomy malfunction: Secondary | ICD-10-CM

## 2022-11-10 DIAGNOSIS — Y833 Surgical operation with formation of external stoma as the cause of abnormal reaction of the patient, or of later complication, without mention of misadventure at the time of the procedure: Secondary | ICD-10-CM | POA: Insufficient documentation

## 2022-11-10 HISTORY — PX: PEG PLACEMENT: SHX5437

## 2022-11-10 SURGERY — INSERTION, PEG TUBE

## 2022-11-10 MED ORDER — LIDOCAINE HCL URETHRAL/MUCOSAL 2 % EX GEL
CUTANEOUS | Status: AC
  Administered 2022-11-10: 11
  Filled 2022-11-10: qty 11

## 2022-11-10 NOTE — Op Note (Signed)
Patient:  Melinda Acevedo  DOB:  [birth year]  MRN:  [id number]   Preop Diagnosis: Dysfunctional PEG tube  Postop Diagnosis: Same  Procedure: PEG tube replacement  Surgeon: Aviva Signs, MD  Anes: Topical lidocaine  Indications: Patient is an 81 year old white female who presents with a dysfunctional PEG.  Risks and benefits of the procedure including bleeding and malposition of the gastrostomy tube were fully explained to the patient, who gave informed consent.  Procedure note: The procedure was performed at bedside in the endoscopy suite.  Surgical site confirmation was performed.  Topical 1% lidocaine was placed around the skin at the gastrostomy tube exit site.  The old 74 French gastrostomy tube was removed.  A 20 French gastrostomy tube was placed into the tract without difficulty.  The balloon was insufflated with 6 cc of air.  Gastric contents were aspirated.  The bolster was placed at the 3-1/2 cm [name].  Patient tolerated the procedure well.  Silver nitrate was applied to the granulation tissue that was present.  Complications: None  EBL: None  Specimen: None

## 2022-11-10 NOTE — Interval H&P Note (Signed)
History and Physical Interval Note:  11/10/2022 8:52 AM  Melinda Acevedo  has presented today for surgery, with the diagnosis of LEAKY PEG TUBE.  The various methods of treatment have been discussed with the patient and family. After consideration of risks, benefits and other options for treatment, the patient has consented to  Procedure(s): PERCUTANEOUS GASTROSTOMY (PEG) PLACEMENT WITHOUT ENDOSCOPY - REPLACEMENT (N/A) as a surgical intervention.  The patient's history has been reviewed, patient examined, no change in status, stable for surgery.  I have reviewed the patient's chart and labs.  Questions were answered to the patient's satisfaction.     Aviva Signs

## 2022-11-23 ENCOUNTER — Encounter (HOSPITAL_COMMUNITY): Payer: Self-pay | Admitting: General Surgery

## 2022-11-24 ENCOUNTER — Telehealth: Payer: Self-pay | Admitting: *Deleted

## 2022-11-24 NOTE — Telephone Encounter (Signed)
Surgical Date: 11/10/2022 Procedure: PEG Placement  Received call from patient [phone]~ telephone.   Patient reports that tube is functioning well and she is no longer having issues with draining.   States that she is grateful for all Dr. Arnoldo Morale assistance.   Provider to be made aware.

## 2022-12-01 ENCOUNTER — Other Ambulatory Visit: Payer: Self-pay | Admitting: Cardiology

## 2022-12-10 IMAGING — MR MR MRA HEAD W/O CM
1 series · 19 of 48 positions shown · IV contrast (gadavist)
Comparison: CT head 04/02/2021

CLINICAL DATA: Acute neuro deficit. Altered mental status, weakness

EXAM:
MRI HEAD WITH CONTRAST
MRA HEAD WITH CONTRAST
MRI NECK WITHOUT AND WITH CONTRAST
TECHNIQUE: Multiplanar, multiecho pulse sequences of the brain and surrounding
structures were obtained with intravenous contrast. Angiographic
images of the head were obtained using MRA technique with contrast.
Multiplanar, multiecho pulse sequences of the neck and surrounding
structures were obtained without and with intravenous contrast.
CONTRAST:  7.5mL GADAVIST GADOBUTROL 1 MMOL/ML IV SOLN

[Series 1: TOF · axial · 0.4mm · 0.43mm/px · z∈[-72,+16]mm · 19 of 232 slices shown]
[im 1/232]
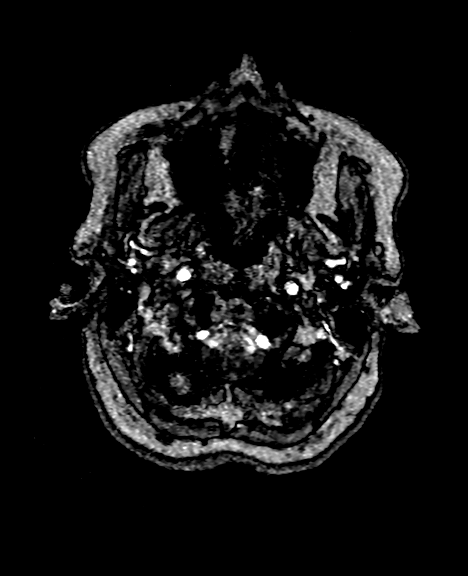
[im 5/232]
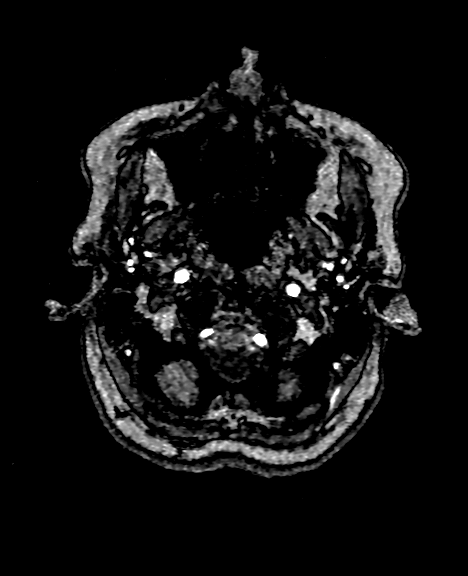
[im 10/232]
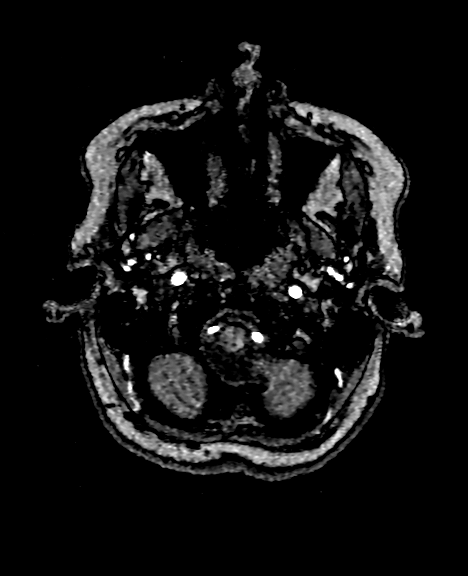
[im 15/232]
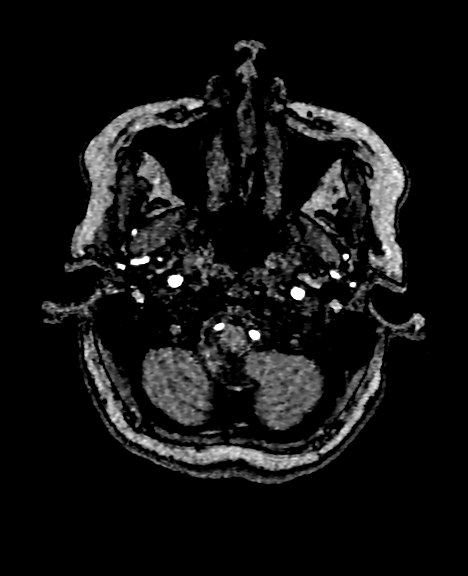
[im 20/232]
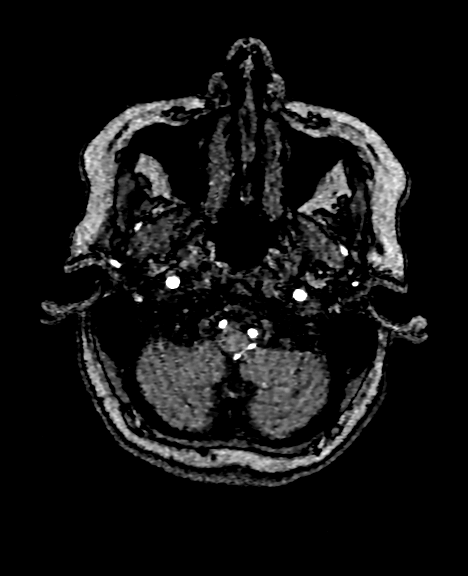
[im 25/232]
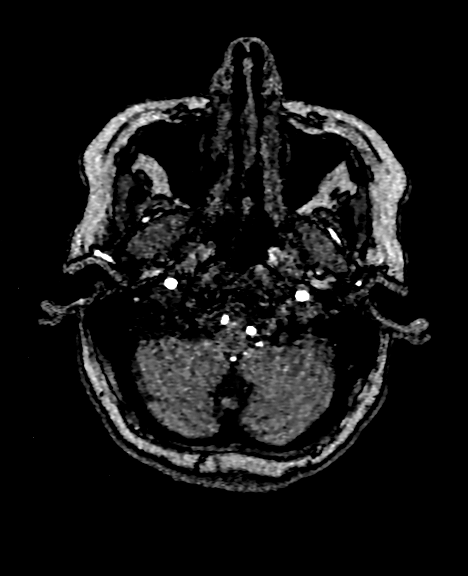
[im 30/232]
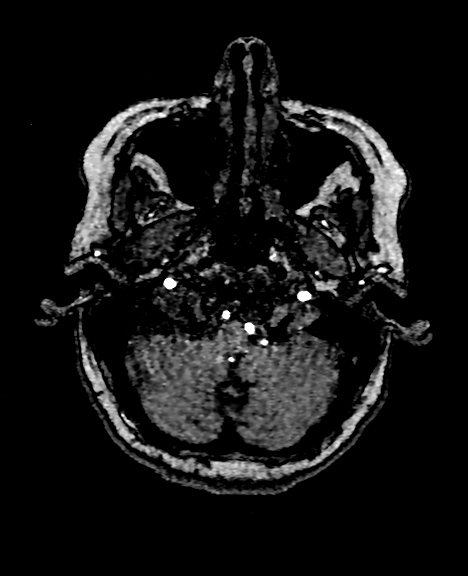
[im 35/232]
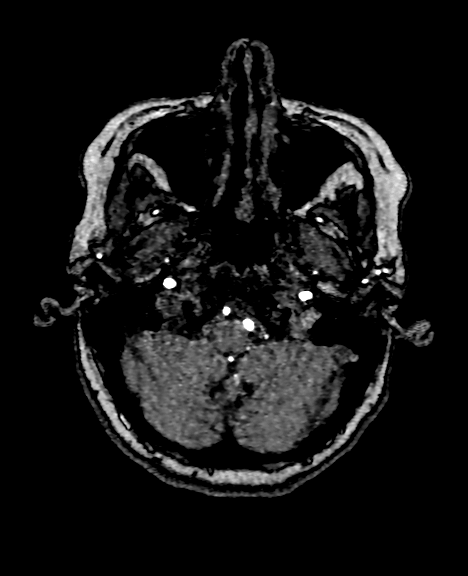
[im 40/232]
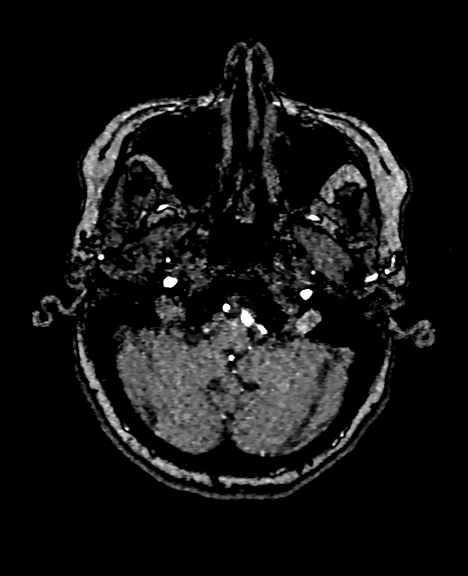
[im 45/232]
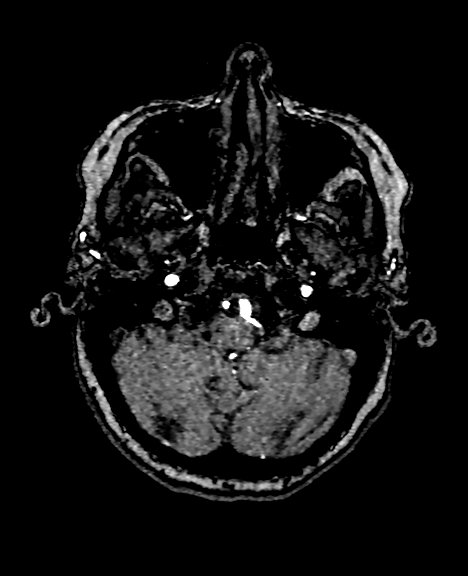
[im 50/232]
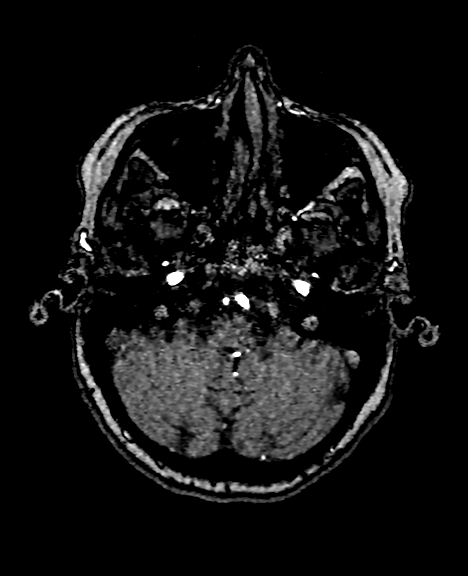
[im 74/232]
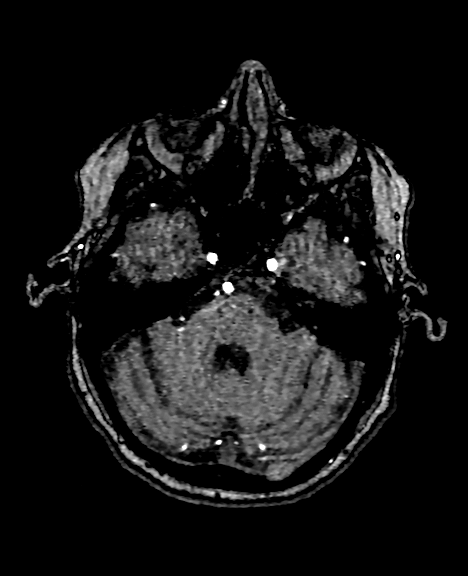
[im 104/232]
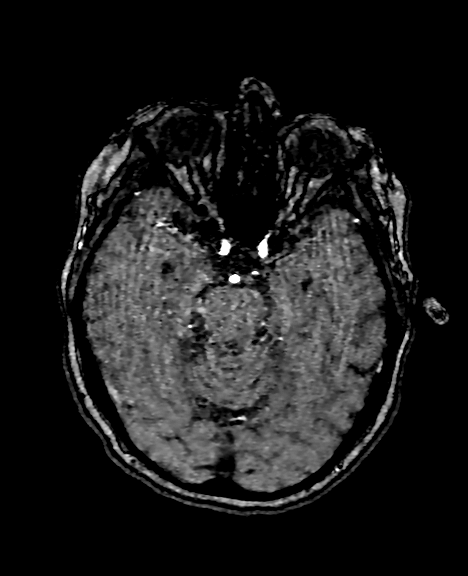
[im 118/232]
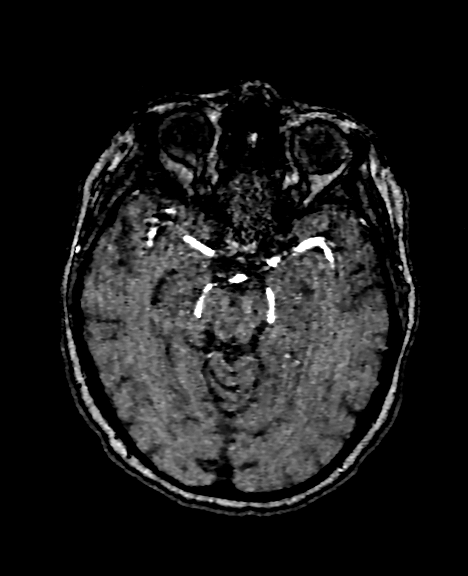
[im 133/232]
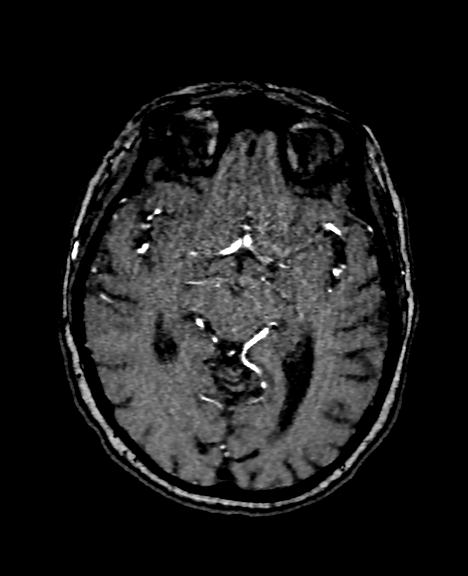
[im 163/232]
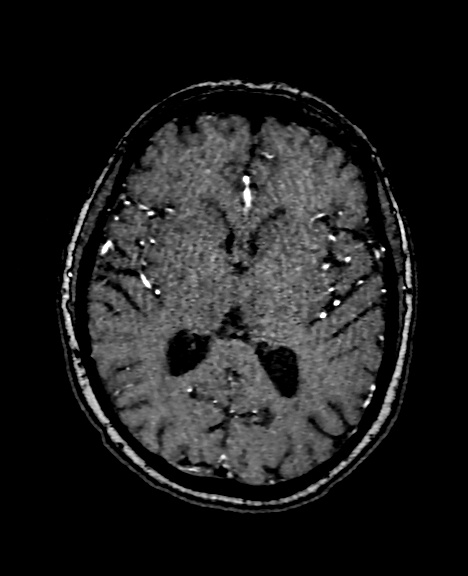
[im 192/232]
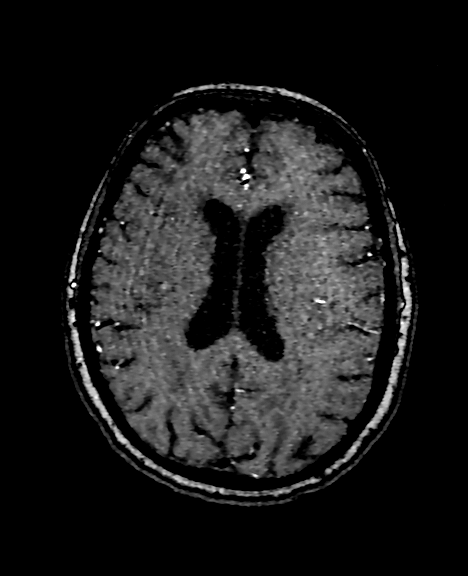
[im 197/232]
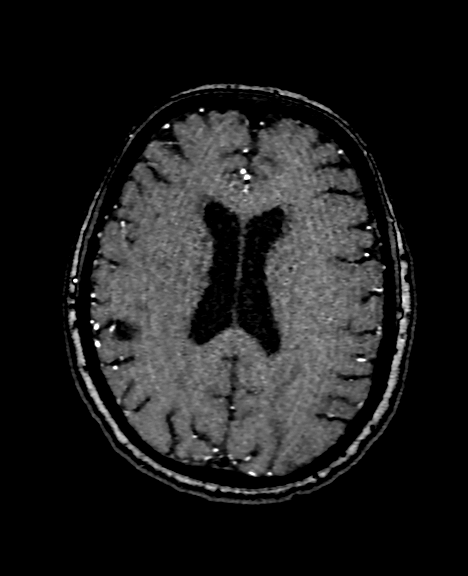
[im 222/232]
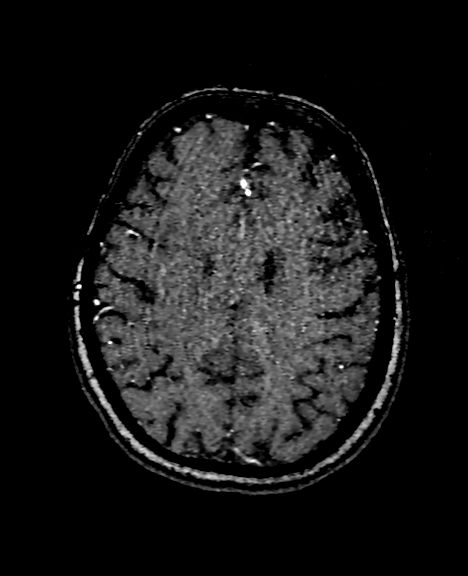

[19 of 48 positions shown; findings below may reference images not displayed]

FINDINGS: MRI HEAD FINDINGS

Brain: Subcentimeter diffusion hyperintensity in the right corona
radiata. This shows high signal on ADC map and increased signal on
FLAIR. Possible subacute to chronic infarct. No other diffusion
abnormality.

Scattered white matter hyperintensities bilaterally, moderate in
degree. Chronic infarct left thalamus. Negative for hemorrhage or
mass. Ventricle size and cerebral volume normal for age.

Vascular: Normal arterial flow voids.

Skull and upper cervical spine: No focal skeletal lesion. Cervical
spondylosis.

Sinuses/Orbits: Negative

Other: None

MRA HEAD FINDINGS

Both vertebral arteries patent to the basilar. Left PICA is a large
vessel with a severe proximal stenosis. Right PICA not visualized.
Right AICA is present. Basilar widely patent. Superior cerebellar
and posterior cerebral arteries patent bilaterally without stenosis.

Internal carotid artery widely patent bilaterally. Anterior and
middle cerebral arteries patent bilaterally. Moderate stenosis right
MCA bifurcation. No significant left MCA stenosis. Anterior cerebral
arteries patent bilaterally without stenosis.

MRA NECK FINDINGS

Antegrade flow in the carotid and vertebral arteries bilaterally

Normal aortic arch and proximal great vessels. Decreased signal in
the proximal vertebral artery bilaterally due to tortuosity. No
significant stenosis.

Carotid bifurcation appears widely patent bilaterally. No carotid
stenosis.
IMPRESSION: Negative for acute infarct. Small white matter lesion in the right
corona radiata may represent a subacute to chronic infarct with T2
shine through.

Mild to moderate chronic microvascular ischemic change.

MRA head demonstrates no intracranial large vessel occlusion. There
is a severe stenosis in the proximal left PICA which is a large
vessel. Right PICA not visualized but may be supplied by the right
AICA.

Moderate stenosis right MCA bifurcation.

## 2023-05-05 ENCOUNTER — Encounter: Payer: Self-pay | Admitting: Cardiology

## 2023-05-05 ENCOUNTER — Ambulatory Visit: Payer: Medicare Other | Attending: Cardiology | Admitting: Cardiology

## 2023-05-05 VITALS — BP 155/85 | HR 74 | Ht 60.0 in | Wt 153.2 lb

## 2023-05-05 DIAGNOSIS — I10 Essential (primary) hypertension: Secondary | ICD-10-CM

## 2023-05-05 DIAGNOSIS — I2510 Atherosclerotic heart disease of native coronary artery without angina pectoris: Secondary | ICD-10-CM

## 2023-05-05 DIAGNOSIS — E782 Mixed hyperlipidemia: Secondary | ICD-10-CM | POA: Diagnosis not present

## 2023-05-05 MED ORDER — AMLODIPINE BESYLATE 5 MG PO TABS: 5.0000 mg | ORAL_TABLET | Freq: Every day | ORAL | 3 refills | Status: DC

## 2023-05-05 MED ORDER — ROSUVASTATIN CALCIUM 10 MG PO TABS: 10.0000 mg | ORAL_TABLET | ORAL | 1 refills | Status: DC

## 2023-05-05 NOTE — Progress Notes (Signed)
Clinical Summary Melinda Acevedo is a 82 y.o.female seen today for follow up of the following medical problems     1. HTN - compliant with meds - 10mg  of norvasc causes swelling, tolerated 5mg  daily - prior bradycardia on beta blocker - prior issues with intermittent low bp's, in general accepting higher bp's for her    - she stopped her norvasc, she is not quite sure what happened to it       2. Hyperlipidemia - compliant with crestor -most recent labs with pcp  - reports crestor causes GI upset. Reports multiple other statins have caused similar symptoms.  -    3. OSA     4. Neck pain/SOB/History of CAD - history of prior stent to LAD in 2010 per patient report - 09/2020 nuclear stress [place]: exercised 3 min, chest pain and fatigue with exercise, no EKG ishcemic changes. No ischemia by imaging.    - symptoms started in 08/2020 - walks out mailbox and back, SOB/DOE, +palpitations, pressure both sides of neck similar to prior stent. Rested and symptoms resolve within 1 minute - episodes continued to occur. Last episode 1 month ago.      - denies recent chest pain       5. Carotid stenosis - followed by vascular 02/2021 carotid US: bilateral mild disease.    6. Gastrostomy tube     Daughter worked [employer] worked at [employer], now lives at [facility] Past Medical History:  Diagnosis Date   Anxiety    CAD (coronary artery disease)    Carotid artery occlusion    Diabetes mellitus without complication (HCC)    Glaucoma    History of hiatal hernia    Hyperlipidemia    Hypertension    Irregular heart beats    Seasonal allergies    Sleep apnea      Allergies  Allergen Reactions   Alendronate     Made bones hurt   Cinoxacin Hives   Contrast Media [Iodinated Contrast Media] Diarrhea    Only kidney ID dyes   Diltiazem     "made her feel like she was in a cloud"   Keflex [Cephalexin] Hives   Naproxen Other (See Comments)    Stomach upset    Nifedipine Other (See Comments)    Unknown reaction   Norfloxacin Hives   Pravastatin Sodium Other (See Comments)    Stomach cramps   Trimethoprim Hives   Valdecoxib Other (See Comments)    Lower belly pain     Current Outpatient Medications  Medication Sig Dispense Refill   amLODipine (NORVASC) 5 MG tablet Take 1 tablet by mouth once daily 90 tablet 1   Apple Cider Vinegar 500 MG TABS Take 500 mg by mouth daily.     aspirin 81 MG chewable tablet Chew 81 mg by mouth every evening.     benazepril (LOTENSIN) 40 MG tablet Take 1 tablet by mouth once daily 90 tablet 1   Cholecalciferol (VITAMIN D) 125 MCG (5000 UT) CAPS Take 5,000 Units by mouth daily. Gummy     Coenzyme Q10 (COQ10) 200 MG CAPS Take 400 mg by mouth daily. Gummy     loratadine (CLARITIN) 10 MG tablet Take 10 mg by mouth every evening.     LORazepam (ATIVAN) 0.5 MG tablet Take 0.5 mg by mouth 3 (three) times daily as needed for anxiety.     Misc Natural Products (ELDERBERRY ZINC/VIT C/IMMUNE MT) Take 1 tablet by mouth  daily.     Multiple Vitamins-Minerals (ADULT ONE DAILY GUMMIES) CHEW Chew 2 tablets by mouth daily.     nitroGLYCERIN (NITROSTAT) 0.4 MG SL tablet Place 0.4 mg under the tongue every 5 (five) minutes as needed for chest pain.     ondansetron (ZOFRAN-ODT) 4 MG disintegrating tablet Take 4 mg by mouth every 8 (eight) hours as needed for nausea or vomiting.     RABEprazole (ACIPHEX) 20 MG tablet Take 20 mg by mouth every evening.     rosuvastatin (CRESTOR) 10 MG tablet Take 10 mg by mouth every evening.     Travoprost, BAK Free, (TRAVATAN) 0.004 % SOLN ophthalmic solution Place 1 drop into both eyes at bedtime.     vitamin B-12 (CYANOCOBALAMIN) 1000 MCG tablet Take 1,000 mcg by mouth daily. Gummy     No current facility-administered medications for this visit.     Past Surgical History:  Procedure Laterality Date   ABDOMINAL HYSTERECTOMY     CATARACT EXTRACTION Bilateral    CHOLECYSTECTOMY     COLONOSCOPY   2017   Dr.Williams in [place] , [place]   COLONOSCOPY WITH PROPOFOL N/A 02/06/2021   Procedure: COLONOSCOPY WITH PROPOFOL;  Surgeon: Dolores Frame, MD;  [facility];  Service: Gastroenterology;  Laterality: N/A;  10:30   COLONOSCOPY WITH PROPOFOL N/A 09/29/2021   Procedure: COLONOSCOPY WITH PROPOFOL;  Surgeon: Dolores Frame, MD;  [facility];  Service: Gastroenterology;  Laterality: N/A;  10:05   CORONARY ANGIOPLASTY     stent   ESOPHAGOGASTRODUODENOSCOPY (EGD) WITH PROPOFOL N/A 02/06/2021   Procedure: ESOPHAGOGASTRODUODENOSCOPY (EGD) WITH PROPOFOL;  Surgeon: Dolores Frame, MD;  [facility];  Service: Gastroenterology;  Laterality: N/A;   ESOPHAGOGASTRODUODENOSCOPY (EGD) WITH PROPOFOL N/A 04/17/2021   Procedure: ESOPHAGOGASTRODUODENOSCOPY (EGD) WITH PROPOFOL;  Surgeon: Franky Macho, MD;  [facility];  Service: General;  Laterality: N/A;   ESOPHAGOGASTRODUODENOSCOPY (EGD) WITH PROPOFOL N/A 05/14/2021   Procedure: ESOPHAGOGASTRODUODENOSCOPY (EGD) WITH PROPOFOL;  Surgeon: Franky Macho, MD;  Location[facility];  Service: General;  Laterality: N/A;   heart catherization  [id number], 2010   IR GASTROSTOMY TUBE REMOVAL  05/13/2021   PEG PLACEMENT N/A 04/17/2021   Procedure: PERCUTANEOUS ENDOSCOPIC GASTROSTOMY (PEG) PLACEMENT;  Surgeon: Franky Macho, MD;  Location: [facility];  Service: General;  Laterality: N/A;   PEG PLACEMENT N/A 05/14/2021   Procedure: PERCUTANEOUS ENDOSCOPIC GASTROSTOMY (PEG) PLACEMENT;  Surgeon: Franky Macho, MD;  Location: [facility];  Service: General;  Laterality: N/A;   PEG PLACEMENT N/A 11/10/2022   Procedure: PERCUTANEOUS GASTROSTOMY (PEG) PLACEMENT WITHOUT ENDOSCOPY - REPLACEMENT;  Surgeon: Franky Macho, MD;  [facility];  Service: Gastroenterology;  Laterality: N/A;   POLYPECTOMY  09/29/2021   Procedure: POLYPECTOMY;  Surgeon: Marguerita Merles, Reuel Boom, MD;  [facility];  Service:  Gastroenterology;;   RECTOCELE REPAIR     TONSILLECTOMY     TUBAL LIGATION  1986   UPPER GASTROINTESTINAL ENDOSCOPY  2015   Dr.Williams in [place], [place]   VAGINAL HYSTERECTOMY  1991     Allergies  Allergen Reactions   Alendronate     Made bones hurt   Cinoxacin Hives   Contrast Media [Iodinated Contrast Media] Diarrhea    Only kidney ID dyes   Diltiazem     "made her feel like she was in a cloud"   Keflex [Cephalexin] Hives   Naproxen Other (See Comments)    Stomach upset   Nifedipine Other (See Comments)    Unknown  reaction   Norfloxacin Hives   Pravastatin Sodium Other (See Comments)    Stomach cramps   Trimethoprim Hives   Valdecoxib Other (See Comments)    Lower belly pain      Family History  Problem Relation Age of Onset   COPD Mother    Heart disease Mother    Heart disease Father    Heart attack Father    Hypertension Sister    Mitral valve prolapse Brother      Social History Ms. Nauert reports that she quit smoking about 60 years ago. Her smoking use included cigarettes. She has never used smokeless tobacco. Ms. Mallinger reports no history of alcohol use.   Review of Systems CONSTITUTIONAL: No weight loss, fever, chills, weakness or fatigue.  HEENT: Eyes: No visual loss, blurred vision, double vision or yellow sclerae.No hearing loss, sneezing, congestion, runny nose or sore throat.  SKIN: No rash or itching.  CARDIOVASCULAR: per hpi RESPIRATORY: No shortness of breath, cough or sputum.  GASTROINTESTINAL: No anorexia, nausea, vomiting or diarrhea. No abdominal pain or blood.  GENITOURINARY: No burning on urination, no polyuria NEUROLOGICAL: No headache, dizziness, syncope, paralysis, ataxia, numbness or tingling in the extremities. No change in bowel or bladder control.  MUSCULOSKELETAL: No muscle, back pain, joint pain or stiffness.  LYMPHATICS: No enlarged nodes. No history of splenectomy.  PSYCHIATRIC: No history of depression or anxiety.   ENDOCRINOLOGIC: No reports of sweating, cold or heat intolerance. No polyuria or polydipsia.  Marland Kitchen   Physical Examination Today's Vitals   05/05/23 1143  BP: (!) 160/98  Pulse: 74  SpO2: 94%  Weight: 153 lb 3.2 oz (69.5 kg)  Height: 5' (1.524 m)   Body mass index is 29.92 kg/m.  Gen: resting comfortably, no acute distress HEENT: no scleral icterus, pupils equal round and reactive, no palptable cervical adenopathy,  CV: RRR, no mrg, no jvd Resp: Clear to auscultation bilaterally GI: abdomen is soft, non-tender, non-distended, normal bowel sounds, no hepatosplenomegaly MSK: extremities are warm, no edema.  Skin: warm, no rash Neuro:  no focal deficits Psych: appropriate affect   Diagnostic Studies  03/2021 echo 1. Basal inferior/inferoseptal hypokinesis. . Left ventricular ejection  fraction, by estimation, is 50 to 55%. The left ventricle has normal  function. Left ventricular diastolic parameters are indeterminate.   2. Right ventricular systolic function is normal. The right ventricular  size is normal.   3. The mitral valve is normal in structure. Trivial mitral valve  regurgitation.   4. The aortic valve is tricuspid. Aortic valve regurgitation is not  visualized. Mild aortic valve sclerosis is present, with no evidence of  aortic valve stenosis.   5. The inferior vena cava is normal in size with greater than 50%  respiratory variability, suggesting right atrial pressure of 3 mmHg.   Assessment and Plan   1. CAD - no recent symptoms - EKG SR, no acute ischemic changes     2. HTN - high bp today, ran out of norvasc - restart norvasc 5mg  daily - prior issues with low bp's, have been more conservative with her bp targets    3. Hyperlipidemia - stomach upset on crestor 10mg  daily, will try 10mg  MWF - request labs from pcp   F/u 1 year     Antoine Poche, M.D.

## 2023-05-05 NOTE — Patient Instructions (Signed)
Medication Instructions:  Your physician has recommended you make the following change in your medication:   -Restart Norvasc 5 mg daily -Start Crestor 10 mg on Monday, Wednesday, and Friday only.   *If you need a refill on your cardiac medications before your next appointment, please call your pharmacy*   Lab Work: None If you have labs (blood work) drawn today and your tests are completely normal, you will receive your results only by: MyChart Message (if you have MyChart) OR A paper copy in the mail If you have any lab test that is abnormal or we need to change your treatment, we will call you to review the results.   Testing/Procedures: None   Follow-Up: At [facility], you and your health needs are our priority.  As part of our continuing mission to provide you with exceptional heart care, we have created designated Provider Care Teams.  These Care Teams include your primary Cardiologist (physician) and Advanced Practice Providers (APPs -  Physician Assistants and Nurse Practitioners) who all work together to provide you with the care you need, when you need it.  We recommend signing up for the patient portal called "MyChart".  Sign up information is provided on this After Visit Summary.  MyChart is used to connect with patients for Virtual Visits (Telemedicine).  Patients are able to view lab/test results, encounter notes, upcoming appointments, etc.  Non-urgent messages can be sent to your provider as well.   To learn more about what you can do with MyChart, go to [email or web address].    Your next appointment:   1 year(s)  Provider:   Dina Rich, MD    Other Instructions

## 2023-06-21 ENCOUNTER — Other Ambulatory Visit: Payer: Self-pay | Admitting: Cardiology

## 2023-06-27 ENCOUNTER — Ambulatory Visit: Payer: Medicare Other | Attending: Physician Assistant | Admitting: Physician Assistant

## 2023-06-27 ENCOUNTER — Ambulatory Visit (INDEPENDENT_AMBULATORY_CARE_PROVIDER_SITE_OTHER): Payer: Medicare Other

## 2023-06-27 ENCOUNTER — Telehealth: Payer: Self-pay | Admitting: Cardiology

## 2023-06-27 ENCOUNTER — Encounter: Payer: Self-pay | Admitting: Physician Assistant

## 2023-06-27 ENCOUNTER — Encounter: Payer: Self-pay | Admitting: Cardiovascular Disease

## 2023-06-27 VITALS — BP 180/80 | HR 75 | Ht 60.0 in | Wt 154.0 lb

## 2023-06-27 DIAGNOSIS — R002 Palpitations: Secondary | ICD-10-CM

## 2023-06-27 DIAGNOSIS — E782 Mixed hyperlipidemia: Secondary | ICD-10-CM | POA: Insufficient documentation

## 2023-06-27 DIAGNOSIS — R079 Chest pain, unspecified: Secondary | ICD-10-CM | POA: Insufficient documentation

## 2023-06-27 DIAGNOSIS — I25119 Atherosclerotic heart disease of native coronary artery with unspecified angina pectoris: Secondary | ICD-10-CM | POA: Diagnosis not present

## 2023-06-27 DIAGNOSIS — I10 Essential (primary) hypertension: Secondary | ICD-10-CM | POA: Diagnosis not present

## 2023-06-27 MED ORDER — ISOSORBIDE MONONITRATE ER 30 MG PO TB24: 30.0000 mg | ORAL_TABLET | Freq: Every day | ORAL | 3 refills | Status: DC

## 2023-06-27 NOTE — Progress Notes (Signed)
Cardiology Office Note:  .   Date:  06/27/2023  ID:  Melinda Acevedo, DOB [birth year], MRN [id number] PCP: Jonathon Bellows, DO  [facility] Providers Cardiologist:  Dina Rich, MD    History of Present Illness: Melinda Acevedo is a 82 y.o. female with history of HTN, HLD, OSA, CAD stent LAD 2010, NST 2021 Dan[place]illed no ischemia, mild bilateral carotid disease, gastrostomy tube.  Patient saw Dr. Wyline Mood 04/2023 with DOE, palpitations and pressure neck going to mailbox similar to symptoms prior to stent. She had stopped norvasc which was restarted with elevated BPand crestor changed to M/W/F because of stomach upset.  Patient added onto my schedule with increase in palpitations, elevated BP 170/91 repeat 144/72 and chest pain relieved with NTG.   Patient comes in for f/u. Has not been taking amlodipine regularly b/c she thought it was causing her heart to skip. Monitor showed HR 40-48. Was achy in chest last night and NTG did relieve it. She was anxious and took some ativan as well. It seems to help skipping. Gastrostomy tube fell out last year and never replaced. She can only drink about 30 ounces of water daily.    ROS:    Studies Reviewed: Marland Kitchen    EKG Interpretation Date/Time:  Monday June 27 2023 13:04:27 EDT Ventricular Rate:  75 PR Interval:  156 QRS Duration:  86 QT Interval:  390 QTC Calculation: 435 R Axis:   51  Text Interpretation: Sinus rhythm with frequent Premature ventricular complexes Anterior infarct , age undetermined When compared with ECG of 02-Apr-2021 22:29, PREVIOUS ECG IS PRESENT Confirmed by Jacolyn Reedy [phone]) on 06/27/2023 1:25:04 PM   Prior CV Studies:   03/2021 echo 1. Basal inferior/inferoseptal hypokinesis. . Left ventricular ejection  fraction, by estimation, is 50 to 55%. The left ventricle has normal  function. Left ventricular diastolic parameters are indeterminate.   2. Right ventricular systolic function is normal. The right ventricular  size  is normal.   3. The mitral valve is normal in structure. Trivial mitral valve  regurgitation.   4. The aortic valve is tricuspid. Aortic valve regurgitation is not  visualized. Mild aortic valve sclerosis is present, with no evidence of  aortic valve stenosis.   5. The inferior vena cava is normal in size with greater than 50%  respiratory variability, suggesting right atrial pressure of 3 mmHg.      Risk Assessment/Calculations:     HYPERTENSION CONTROL Vitals:   06/27/23 1255 06/27/23 1324  BP: (!) 160/78 (!) 180/80    The patient's blood pressure is elevated above target today.  In order to address the patient's elevated BP: Blood pressure will be monitored at home to determine if medication changes need to be made.; A new medication was prescribed today.; Follow up with general cardiology has been recommended.; Follow up with primary care provider for management.          Physical Exam:   VS:  BP (!) 180/80   Pulse 75   Ht 5' (1.524 m)   Wt 154 lb (69.9 kg)   SpO2 97%   BMI 30.08 kg/m    Wt Readings from Last 3 Encounters:  06/27/23 154 lb (69.9 kg)  05/05/23 153 lb 3.2 oz (69.5 kg)  11/09/22 152 lb (68.9 kg)    GEN: Well nourished, well developed in no acute distress NECK: No JVD; No carotid bruits CARDIAC:  RRR, no murmurs, rubs, gallops RESPIRATORY:  Clear to auscultation without rales, wheezing or  rhonchi  ABDOMEN: Soft, non-tender, non-distended EXTREMITIES:  No edema; No deformity   ASSESSMENT AND PLAN: .    CAD with chest pain releived with NTG last night-add Imdur  HTN-running high. 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 Will order 2 week Zio. Scheduled for labs next week at PCP. I asked her to have them check Thyroid and send [place] lab results.   HLD on crestor M/W/F        Dispo: f/u with myself or  Dr. Wyline Mood after Zio back.  Signed, Jacolyn Reedy, PA-C

## 2023-06-27 NOTE — Patient Instructions (Signed)
Medication Instructions:   Take Imdur 1/2 tablet daily for 3 days and then increase to 1 tablet daily.   *If you need a refill on your cardiac medications before your next appointment, please call your pharmacy*   Lab Work: NONE   If you have labs (blood work) drawn today and your tests are completely normal, you will receive your results only by: MyChart Message (if you have MyChart) OR A paper copy in the mail If you have any lab test that is abnormal or we need to change your treatment, we will call you to review the results.   Testing/Procedures: Christena Deem- Long Term Monitor Instructions   Your physician has requested you wear your ZIO patch monitor___14____days.   This is a single patch monitor.  [employer] supplies one patch monitor per enrollment.  Additional stickers are not available.   Please do not apply patch if you will be having a Nuclear Stress Test, Echocardiogram, Cardiac CT, MRI, or Chest Xray during the time frame you would be wearing the monitor. The patch cannot be worn during these tests.  You cannot remove and re-apply the ZIO XT patch monitor.   Your ZIO patch monitor will be sent USPS Priority mail from [facility] directly to your home address. The monitor may also be mailed to a PO BOX if home delivery is not available.   It may take 3-5 days to receive your monitor after you have been enrolled.   Once you have received you monitor, please review enclosed instructions.  Your monitor has already been registered assigning a specific monitor serial # to you.   Applying the monitor   Shave hair from upper left chest.   Hold abrader disc by orange tab.  Rub abrader in 40 strokes over left upper chest as indicated in your monitor instructions.   Clean area with 4 enclosed alcohol pads .  Use all pads to assure are is cleaned thoroughly.  Let dry.   Apply patch as indicated in monitor instructions.  Patch will be place under collarbone on left side of chest  with arrow pointing upward.   Rub patch adhesive wings for 2 minutes.Remove white label marked "1".  Remove white label marked "2".  Rub patch adhesive wings for 2 additional minutes.   While looking in a mirror, press and release button in center of patch.  A small green light will flash 3-4 times .  This will be your only indicator the monitor has been turned on.     Do not shower for the first 24 hours.  You may shower after the first 24 hours.   Press button if you feel a symptom. You will hear a small click.  Record Date, Time and Symptom in the Patient Log Book.   When you are ready to remove patch, follow instructions on last 2 pages of Patient Log Book.  Stick patch monitor onto last page of Patient Log Book.   Place Patient Log Book in [place] box.  Use locking tab on box and tape box closed securely.  The [employer] and [employer] has [employer] on it.  Please place in mailbox as soon as possible.  Your physician should have your test results approximately 7 days after the monitor has been mailed back to [facility].   Call [facility] Customer Care at [phone] if you have questions regarding your ZIO XT patch monitor.  Call them immediately if you see an orange light blinking on your monitor.  If your monitor falls off in less than 4 days contact our Monitor department at [phone].  If your monitor becomes loose or falls off after 4 days call [employer] at [phone] for suggestions on securing your monitor.     Follow-Up: At [facility], you and your health needs are our priority.  As part of our continuing mission to provide you with exceptional heart care, we have created designated Provider Care Teams.  These Care Teams include your primary Cardiologist (physician) and Advanced Practice Providers (APPs -  Physician Assistants and Nurse Practitioners) who all work together to provide you with the care you need, when you need it.  We recommend signing up  for the patient portal called "MyChart".  Sign up information is provided on this After Visit Summary.  MyChart is used to connect with patients for Virtual Visits (Telemedicine).  Patients are able to view lab/test results, encounter notes, upcoming appointments, etc.  Non-urgent messages can be sent to your provider as well.   To learn more about what you can do with MyChart, go to [email or web address].    Your next appointment:   After Zio Monitor   Provider:   Dina Rich, MD or Jacolyn Reedy, PA-C    Other Instructions Thank you for choosing [facility]!

## 2023-06-27 NOTE — Telephone Encounter (Signed)
error 

## 2023-06-27 NOTE — Telephone Encounter (Signed)
Patient concerned her heartbeat is more irregular than usual. Patient is concerned her BP was elevated 170/91 HR 60, repeated BP and it was 144/72   She had some CP briefly last nite and took a NTG and it was resolved. No pain currently   We have an opening today at 1 pm she will come for apt

## 2023-06-27 NOTE — Telephone Encounter (Signed)
STAT if HR is under 50 or over 120 (normal HR is 60-100 beats per minute)  What is your heart rate? 60 right now, BP 170/91, before her BP medication  Do you have a log of your heart rate readings (document readings)? Has been 48, 60's, yesterday went down to 40  Do you have any other symptoms? Feels weak, periodic nausea, and chest discomfort yesterday   Pt c/o of Chest Pain: STAT if CP now or developed within 24 hours  1. Are you having CP right now? Has pain in her back left shoulder blade when she moves, had chest pain last night  2. Are you experiencing any other symptoms (ex. SOB, nausea, vomiting, sweating)? Periodic nausea, a couple times a day, today feels weak   3. How long have you been experiencing CP? Last night around 5 pm or 6 pm  4. Is your CP continuous or coming and going? Pain in back comes and goes with movement  5. Have you taken Nitroglycerin? yes  Patient states her HR has been getting very low and she has been having chest and back discomfort. She says her chest discomfort happened last night and she took a nitroglycerin around 11 pm. She says this did help. She says now she has been having pain in her back by her shoulder blades with movement, but is not sure if that is muscular. She says she has also been having periodic nausea and feels very weak today.?

## 2023-08-03 ENCOUNTER — Telehealth: Payer: Self-pay | Admitting: Cardiology

## 2023-08-03 NOTE — Telephone Encounter (Signed)
Notified patient that [facility] has not received lab work from PCP yet. Will send a request for labs completed in July 2024.

## 2023-08-03 NOTE — Telephone Encounter (Signed)
Pt is requesting a callback regarding her pcp Jonathon Bellows, DO sending over lab results. Please advise

## 2023-08-09 ENCOUNTER — Ambulatory Visit: Payer: Medicare Other | Attending: Cardiology | Admitting: Cardiology

## 2023-08-09 ENCOUNTER — Encounter: Payer: Self-pay | Admitting: Cardiology

## 2023-08-09 VITALS — BP 136/66 | HR 70 | Ht 60.0 in | Wt 156.0 lb

## 2023-08-09 DIAGNOSIS — R002 Palpitations: Secondary | ICD-10-CM | POA: Diagnosis present

## 2023-08-09 DIAGNOSIS — E782 Mixed hyperlipidemia: Secondary | ICD-10-CM

## 2023-08-09 DIAGNOSIS — I10 Essential (primary) hypertension: Secondary | ICD-10-CM | POA: Diagnosis present

## 2023-08-09 DIAGNOSIS — I25119 Atherosclerotic heart disease of native coronary artery with unspecified angina pectoris: Secondary | ICD-10-CM

## 2023-08-09 NOTE — Patient Instructions (Signed)
Medication Instructions:  Your physician recommends that you continue on your current medications as directed. Please refer to the Current Medication list given to you today.  *If you need a refill on your cardiac medications before your next appointment, please call your pharmacy*   Lab Work: NONE   If you have labs (blood work) drawn today and your tests are completely normal, you will receive your results only by: MyChart Message (if you have MyChart) OR A paper copy in the mail If you have any lab test that is abnormal or we need to change your treatment, we will call you to review the results.   Testing/Procedures: NONE    Follow-Up: At [facility], you and your health needs are our priority.  As part of our continuing mission to provide you with exceptional heart care, we have created designated Provider Care Teams.  These Care Teams include your primary Cardiologist (physician) and Advanced Practice Providers (APPs -  Physician Assistants and Nurse Practitioners) who all work together to provide you with the care you need, when you need it.  We recommend signing up for the patient portal called "MyChart".  Sign up information is provided on this After Visit Summary.  MyChart is used to connect with patients for Virtual Visits (Telemedicine).  Patients are able to view lab/test results, encounter notes, upcoming appointments, etc.  Non-urgent messages can be sent to your provider as well.   To learn more about what you can do with MyChart, go to [email or web address].    Your next appointment:   2 month(s)  Provider:   You may see Dina Rich, MD or one of the following Advanced Practice Providers on your designated Care Team:   Randall An, PA-C  Jacolyn Reedy, [place]     Other Instructions Thank you for choosing [facility]!

## 2023-08-09 NOTE — Progress Notes (Signed)
Will refer her to EPS

## 2023-08-09 NOTE — Progress Notes (Signed)
Cardiology Office Note:  .   Date:  08/09/2023  ID:  Melinda Acevedo, DOB 1941-04-29, MRN [id number] PCP: Jonathon Bellows, DO  [facility] Providers Cardiologist:  Dina Rich, MD    History of Present Illness: Melinda Acevedo is a 82 y.o. female with a history of hypertension, hyperlipidemia, OSA, CAD status post PCI/DES to LAD in 2010, palpitations, hypothyroidism, fatigue, mild bilateral carotid disease, who is here today for follow-up.  Patient was last seen in clinic 06/27/2023 as an add-on to M. Lenze, PA schedule for an increase in palpitations and elevated BP and chest pain relieved with nitro.  It was found the patient not been taking her amlodipine regularly because she thought it caused her heart to skip.  Monitor showed heart rate of 40-48.  Complained of chest discomfort the night before and taking a Nitrostat which did relieve her pain.  She was started on Imdur and placed on a ZIO monitor for 2 weeks.  ZIO monitor resulted which showed some SVT and a high PVC burden of 12.7% patient with a history of symptomatic bradycardia and has not been on beta-blocker therapy due to this.  She returns to clinic today with complaints of nausea, palpitations, fatigue, muscle tightness in the left leg, and difficulty with sleeping.  She had printed out the results of her ZIO monitor today and has questions.  Unfortunately she had not been able to tolerate AV nodal blocking medications due to symptomatic bradycardia.  Blood pressure is well-controlled today.  She has not had any further episodes of chest discomfort.  No episodes of shortness of breath.  States that she has been compliant with her current medication regimen.  Denies any hospitalizations or visits to the emergency department.  Had noted that she is on as needed Ativan and with the palpitation she has been having at night had started taking the Ativan more regularly versus on an as-needed basis.  She states that she has stopped  taking it every night at this time.  She also noted she has been treated for a UTI and a yeast infection and is wondering if the Diflucan that she had to take for yeast is what made her stomach upset versus her diverticula.  ROS: 10 point review of systems has been reviewed and considered negative with exception of what is been listed in the HPI  Studies Reviewed: .       Heart Monitor 07/18/23   14 day monitor   Rare supraventricular ectopy in the form of isolated PACs, couplets, triplets. 11 runs of SVT, longest 10 beats.   Frequent ventricular ectopy in the form isolated PVCs (12.7% burden). Rare couplets and triplets. 5 runs of NSVT longest 11 beats.   Reported symptoms correlated with sinus rhythm with PACs and PVCs     Patch Wear Time:  13 days and 23 hours (2024-07-08T13:36:42-0400 to 2024-07-22T13:36:34-0400)   Patient had a min HR of 40 bpm, max HR of 188 bpm, and avg HR of 69 bpm. Predominant underlying rhythm was Sinus Rhythm. 5 Ventricular Tachycardia runs occurred, the run with the fastest interval lasting 5 beats with a max rate of 188 bpm, the longest  lasting 11 beats with an avg rate of 103 bpm. 11 Supraventricular Tachycardia runs occurred, the run with the fastest interval lasting 5 beats with a max rate of 138 bpm, the longest lasting 10 beats with an avg rate of 104 bpm. Idioventricular Rhythm  was present. Isolated SVEs were rare (<1.0%),  SVE Couplets were rare (<1.0%), and SVE Triplets were rare (<1.0%). Isolated VEs were frequent (12.7%, [id number]), VE Couplets were rare (<1.0%, 2039), and VE Triplets were rare (<1.0%, 31). Ventricular Bigeminy  and Trigeminy were present.  03/2021 echo 1. Basal inferior/inferoseptal hypokinesis. . Left ventricular ejection  fraction, by estimation, is 50 to 55%. The left ventricle has normal  function. Left ventricular diastolic parameters are indeterminate.   2. Right ventricular systolic function is normal. The right ventricular  size  is normal.   3. The mitral valve is normal in structure. Trivial mitral valve  regurgitation.   4. The aortic valve is tricuspid. Aortic valve regurgitation is not  visualized. Mild aortic valve sclerosis is present, with no evidence of  aortic valve stenosis.   5. The inferior vena cava is normal in size with greater than 50%  respiratory variability, suggesting right atrial pressure of 3 mmHg Risk Assessment/Calculations:             Physical Exam:   VS:  BP 136/66   Pulse 70   Ht 5' (1.524 m)   Wt 156 lb (70.8 kg)   SpO2 96%   BMI 30.47 kg/m    Wt Readings from Last 3 Encounters:  08/09/23 156 lb (70.8 kg)  06/27/23 154 lb (69.9 kg)  05/05/23 153 lb 3.2 oz (69.5 kg)    GEN: Well nourished, well developed in no acute distress NECK: No JVD; No carotid bruits CARDIAC: RRR, no murmurs, rubs, gallops RESPIRATORY:  Clear to auscultation without rales, wheezing or rhonchi  ABDOMEN: Soft, non-tender, non-distended EXTREMITIES:  No edema; No deformity   ASSESSMENT AND PLAN: .   Palpitations with increasing frequency and intensity.  Recent ZIO monitor worn for 14 days.  Monitor reviewed isolated PACs, 11 runs of SVT with the longest being 10 beats, and frequent ventricular ectopy in the form of isolated PVCs with a 12.7% burden and 5 runs of NSVT with the longest being 11 beats.  Previously unable to tolerate AV nodal blocking agents due to symptomatic bradycardia.  Refer sent to EP for further evaluation.  Recent TSH 3.26  Primary hypertension blood pressure 136/66 today.  Blood pressure has been better controlled since recently restarting Norvasc.  She is continued on amlodipine, benazepril, Imdur.  Encouraged to monitor pressures at home 1 to 2 hours post medication administrations.  Hyperlipidemia where she is on Crestor Monday Wednesday and Friday.  LDL 133, remains above goal.  This continues to be managed by her PCP.  Coronary artery disease without any reoccurrence of chest  discomfort.  Patient is continued on aspirin, statin, and Imdur.         Dispo: Patient return to clinic to see MD/APP in 2 months or sooner if needed to reevaluate today symptoms  Signed, Takirah Binford, NP

## 2023-09-02 ENCOUNTER — Encounter: Payer: Self-pay | Admitting: Internal Medicine

## 2023-09-02 ENCOUNTER — Ambulatory Visit: Payer: Medicare Other | Attending: Internal Medicine | Admitting: Internal Medicine

## 2023-09-02 VITALS — BP 164/70 | HR 75 | Ht 60.0 in | Wt 157.0 lb

## 2023-09-02 DIAGNOSIS — I493 Ventricular premature depolarization: Secondary | ICD-10-CM | POA: Diagnosis not present

## 2023-09-02 MED ORDER — METOPROLOL TARTRATE 25 MG PO TABS: 12.5000 mg | ORAL_TABLET | Freq: Two times a day (BID) | ORAL | 3 refills | Status: DC

## 2023-09-02 NOTE — Progress Notes (Signed)
HPI Melinda Acevedo is referred by Dr. Wyline Mood for evaluation of PVC's and NS AT. She is pleasant 82 yo woman, formely seen in [place] who has a h/o CAD with LAD stenting remotely. She notes that her heart skips beats. She has not had syncope. Her PVC burden is 12%. She has some fatigue. No chest pain or sob.  Allergies  Allergen Reactions   Alendronate     Made bones hurt   Cinoxacin Hives   Contrast Media [Iodinated Contrast Media] Diarrhea    Only kidney ID dyes   Diltiazem     "made her feel like she was in a cloud"   Keflex [Cephalexin] Hives   Naproxen Other (See Comments)    Stomach upset   Nifedipine Other (See Comments)    Unknown reaction   Norfloxacin Hives   Pravastatin Sodium Other (See Comments)    Stomach cramps   Trimethoprim Hives   Valdecoxib Other (See Comments)    Lower belly pain     Current Outpatient Medications  Medication Sig Dispense Refill   amLODipine (NORVASC) 5 MG tablet Take 1 tablet (5 mg total) by mouth daily. 90 tablet 3   Apple Cider Vinegar 500 MG TABS Take 500 mg by mouth daily.     aspirin 81 MG chewable tablet Chew 81 mg by mouth every evening.     benazepril (LOTENSIN) 40 MG tablet Take 1 tablet (40 mg total) by mouth daily. 90 tablet 3   Cholecalciferol (VITAMIN D) 125 MCG (5000 UT) CAPS Take 5,000 Units by mouth daily. Gummy     Coenzyme Q10 (COQ10) 200 MG CAPS Take 400 mg by mouth daily. Gummy     isosorbide mononitrate (IMDUR) 30 MG 24 hr tablet Take 1 tablet (30 mg total) by mouth daily. Take 1/2 tablet daily for 3 days and then increase to 1 tablet daily 90 tablet 3   loratadine (CLARITIN) 10 MG tablet Take 10 mg by mouth every evening.     LORazepam (ATIVAN) 0.5 MG tablet Take 0.5 mg by mouth 3 (three) times daily as needed for anxiety.     Misc Natural Products (ELDERBERRY ZINC/VIT C/IMMUNE MT) Take 1 tablet by mouth daily.     Multiple Vitamins-Minerals (ADULT ONE DAILY GUMMIES) CHEW Chew 2 tablets by mouth daily.      nitroGLYCERIN (NITROSTAT) 0.4 MG SL tablet Place 0.4 mg under the tongue every 5 (five) minutes as needed for chest pain.     ondansetron (ZOFRAN-ODT) 4 MG disintegrating tablet Take 4 mg by mouth every 8 (eight) hours as needed for nausea or vomiting.     RABEprazole (ACIPHEX) 20 MG tablet Take 20 mg by mouth every evening.     rosuvastatin (CRESTOR) 10 MG tablet Take 1 tablet (10 mg total) by mouth every Monday, Wednesday, and Friday. 30 tablet 1   Travoprost, BAK Free, (TRAVATAN) 0.004 % SOLN ophthalmic solution Place 1 drop into both eyes at bedtime.     vitamin B-12 (CYANOCOBALAMIN) 1000 MCG tablet Take 1,000 mcg by mouth daily. Gummy     No current facility-administered medications for this visit.     Past Medical History:  Diagnosis Date   Anxiety    CAD (coronary artery disease)    Carotid artery occlusion    Diabetes mellitus without complication (HCC)    Glaucoma    History of hiatal hernia    Hyperlipidemia    Hypertension    Irregular heart beats    Seasonal allergies  Sleep apnea     ROS:   All systems reviewed and negative except as noted in the HPI.   Past Surgical History:  Procedure Laterality Date   ABDOMINAL HYSTERECTOMY     CATARACT EXTRACTION Bilateral    CHOLECYSTECTOMY     COLONOSCOPY  2017   Dr.Williams in [place] , [place]   COLONOSCOPY WITH PROPOFOL N/A 02/06/2021   Procedure: COLONOSCOPY WITH PROPOFOL;  Surgeon: Dolores Frame, MD;  [facility];  Service: Gastroenterology;  Laterality: N/A;  10:30   COLONOSCOPY WITH PROPOFOL N/A 09/29/2021   Procedure: COLONOSCOPY WITH PROPOFOL;  Surgeon: Dolores Frame, MD;  Location: [facility];  Service: Gastroenterology;  Laterality: N/A;  10:05   CORONARY ANGIOPLASTY     stent   ESOPHAGOGASTRODUODENOSCOPY (EGD) WITH PROPOFOL N/A 02/06/2021   Procedure: ESOPHAGOGASTRODUODENOSCOPY (EGD) WITH PROPOFOL;  Surgeon: Dolores Frame, MD;  [facility];   Service: Gastroenterology;  Laterality: N/A;   ESOPHAGOGASTRODUODENOSCOPY (EGD) WITH PROPOFOL N/A 04/17/2021   Procedure: ESOPHAGOGASTRODUODENOSCOPY (EGD) WITH PROPOFOL;  Surgeon: Franky Macho, MD;  Location: [facility];  Service: General;  Laterality: N/A;   ESOPHAGOGASTRODUODENOSCOPY (EGD) WITH PROPOFOL N/A 05/14/2021   Procedure: ESOPHAGOGASTRODUODENOSCOPY (EGD) WITH PROPOFOL;  Surgeon: Franky Macho, MD;  Location: [facility];  Service: General;  Laterality: N/A;   heart catherization  4098,1191, 2010   IR GASTROSTOMY TUBE REMOVAL  05/13/2021   PEG PLACEMENT N/A 04/17/2021   Procedure: PERCUTANEOUS ENDOSCOPIC GASTROSTOMY (PEG) PLACEMENT;  Surgeon: Franky Macho, MD;  Location: [facility];  Service: General;  Laterality: N/A;   PEG PLACEMENT N/A 05/14/2021   Procedure: PERCUTANEOUS ENDOSCOPIC GASTROSTOMY (PEG) PLACEMENT;  Surgeon: Franky Macho, MD;  Location: [facility];  Service: General;  Laterality: N/A;   PEG PLACEMENT N/A 11/10/2022   Procedure: PERCUTANEOUS GASTROSTOMY (PEG) PLACEMENT WITHOUT ENDOSCOPY - REPLACEMENT;  Surgeon: Franky Macho, MD;  [facility];  Service: Gastroenterology;  Laterality: N/A;   POLYPECTOMY  09/29/2021   Procedure: POLYPECTOMY;  Surgeon: Marguerita Merles, Reuel Boom, MD;  [facility];  Service: Gastroenterology;;   RECTOCELE REPAIR     TONSILLECTOMY     TUBAL LIGATION  1986   UPPER GASTROINTESTINAL ENDOSCOPY  2015   Dr.Williams in [place], [place]   VAGINAL HYSTERECTOMY  1991     Family History  Problem Relation Age of Onset   COPD Mother    Heart disease Mother    Heart disease Father    Heart attack Father    Hypertension Sister    Mitral valve prolapse Brother      Social History   Socioeconomic History   Marital status: Married    Spouse name: Not on file   Number of children: Not on file   Years of education: Not on file   Highest education level: Not on file  Occupational History   Not on file  Tobacco Use   Smoking status:  Former    Current packs/day: 0.00    Types: Cigarettes    Start date: 110    Quit date: 1964    Years since quitting: 60.7   Smokeless tobacco: Never  Vaping Use   Vaping status: Never Used  Substance and Sexual Activity   Alcohol use: No   Drug use: No   Sexual activity: Not on file  Other Topics Concern   Not on file  Social History Narrative   Not on file   Social Determinants of Health   Financial Resource Strain: Not on file  Food  Insecurity: Not on file  Transportation Needs: Not on file  Physical Activity: Not on file  Stress: Not on file  Social Connections: Not on file  Intimate Partner Violence: Not on file     BP (!) 164/70   Pulse 75   Ht 5' (1.524 m)   Wt 157 lb (71.2 kg)   SpO2 (!) 89%   BMI 30.66 kg/m   Physical Exam:  Well appearing overweight woman, NAD HEENT: Unremarkable Neck:  No JVD, no thyromegally Lymphatics:  No adenopathy Back:  No CVA tenderness Lungs:  Clear with no wheezes HEART:  Regular rate rhythm, no murmurs, no rubs, no clicks Abd:  soft, positive bowel sounds, no organomegally, no rebound, no guarding Ext:  2 plus pulses, no edema, no cyanosis, no clubbing Skin:  No rashes no nodules Neuro:  CN II through XII intact, motor grossly intact  EKG - NSR with PVC's 7/24   Assess/Plan: Palpitations - she will be tried on low dose metoprolol. She may develop bradycardia. With her h/o PCI stenting, not a great candidate for flecainide.  PVC's/NS AT - likely these are the cause of her symptoms. Hopefully low dose beta blocker will help.  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 We will follow.  Melinda Gowda Alveena Taira,MD

## 2023-09-02 NOTE — Patient Instructions (Addendum)
Medication Instructions:  Your physician has recommended you make the following change in your medication:  Start metoprolol tartrate 1/2 tablet (12.5mg ) by mouth two times daily.  Lab Work: None ordered.  If you have labs (blood work) drawn today and your tests are completely normal, you will receive your results only by: MyChart Message (if you have MyChart) OR A paper copy in the mail If you have any lab test that is abnormal or we need to change your treatment, we will call you to review the results.  Testing/Procedures: None ordered.  Follow-Up: At [facility], you and your health needs are our priority.  As part of our continuing mission to provide you with exceptional heart care, we have created designated Provider Care Teams.  These Care Teams include your primary Cardiologist (physician) and Advanced Practice Providers (APPs -  Physician Assistants and Nurse Practitioners) who all work together to provide you with the care you need, when you need it.   Your next appointment:   [place] follow up  The format for your next appointment:   In Person  Provider:   Lewayne Bunting, MD{or one of the following Advanced Practice Providers on your designated Care Team:   Francis Dowse, [place] Casimiro Needle "Mardelle Matte" [place], [place] Earnest Rosier, NP   Important Information About Sugar

## 2023-09-26 ENCOUNTER — Other Ambulatory Visit: Payer: Self-pay | Admitting: Cardiology

## 2023-10-03 ENCOUNTER — Telehealth: Payer: Self-pay | Admitting: Internal Medicine

## 2023-10-03 NOTE — Telephone Encounter (Signed)
New Message:     Patient wants to know when does Dr Ladona Ridgel want to see her again? She said he had also mention about her wearing a Monitor.

## 2023-10-03 NOTE — Telephone Encounter (Signed)
Left message per DPR that she has a appointment in [place] this thursday October  17th at 2pm, and that she may call back with any questions.

## 2023-10-06 ENCOUNTER — Encounter: Payer: Self-pay | Admitting: Student

## 2023-10-06 ENCOUNTER — Ambulatory Visit: Payer: Medicare Other | Attending: Student | Admitting: Student

## 2023-10-06 VITALS — BP 144/66 | HR 66 | Ht 60.0 in | Wt 160.0 lb

## 2023-10-06 DIAGNOSIS — I25118 Atherosclerotic heart disease of native coronary artery with other forms of angina pectoris: Secondary | ICD-10-CM | POA: Insufficient documentation

## 2023-10-06 DIAGNOSIS — I493 Ventricular premature depolarization: Secondary | ICD-10-CM | POA: Insufficient documentation

## 2023-10-06 DIAGNOSIS — I10 Essential (primary) hypertension: Secondary | ICD-10-CM | POA: Insufficient documentation

## 2023-10-06 DIAGNOSIS — R0609 Other forms of dyspnea: Secondary | ICD-10-CM | POA: Diagnosis not present

## 2023-10-06 DIAGNOSIS — I6523 Occlusion and stenosis of bilateral carotid arteries: Secondary | ICD-10-CM | POA: Diagnosis not present

## 2023-10-06 DIAGNOSIS — E785 Hyperlipidemia, unspecified: Secondary | ICD-10-CM | POA: Insufficient documentation

## 2023-10-06 NOTE — Patient Instructions (Signed)
Medication Instructions:   Your physician recommends that you continue on your current medications as directed. Please refer to the Current Medication list given to you today.   Labwork: None today  Testing/Procedures: Your physician has requested that you have en exercise stress myoview. For further information please visit [email or web address] Please follow instruction sheet, as given.   Follow-Up: 4-5 months with Dr.Branch  Any Other Special Instructions Will Be Listed Below (If Applicable).  If you need a refill on your cardiac medications before your next appointment, please call your pharmacy.

## 2023-10-06 NOTE — Progress Notes (Signed)
Cardiology Office Note    Date:  10/06/2023  ID:  Melinda Acevedo, DOB [birth year], MRN [id number] Cardiologist: Dina Rich, MD   EP: Dr. Ladona Ridgel  History of Present Illness:    Melinda Acevedo is a 82 y.o. female with past medical history of CAD (s/p stenting to LAD in 2010 per patient's report, no ischemia by NST in 09/2020), palpitations (monitor in 06/2023 showing SVT and 12.7% PVC burden), carotid artery stenosis, HTN, HLD and OSA who presents to the office today for 74-month follow-up.  She was examined by Charlsie Quest, NP in 07/2023 and reported having frequent palpitations along with nausea, fatigue and difficulty sleeping. She had previously been intolerant to AV nodal blocking agents due to bradycardia. She was referred to EP for further evaluation. She did meet with Dr. Ladona Ridgel in 08/2023 and it was recommended to try low-dose Lopressor 12.5 mg twice daily as she would not be a candidate for Flecainide given her history of CAD.  In talking with the patient today, she reports that her palpitations have decreased since being started on Lopressor. Also reports she stopped using Ativan and feels like this helped with her symptoms as well. Consumes minimal caffeine and no alcohol use. She denies any specific chest pain, orthopnea or PND but does report dyspnea on exertion and feels like symptoms have progressed over the past few months. Reports this was her prior anginal symptom as well. She does use her CPAP on a nightly basis.  Studies Reviewed:   EKG: EKG is not ordered today. EKG from 06/27/2023 is personally reviewed and shows normal sinus rhythm, heart rate 75 with PVC's. No acute ST changes.  Echocardiogram: 03/2021 IMPRESSIONS     1. Basal inferior/inferoseptal hypokinesis. . Left ventricular ejection  fraction, by estimation, is 50 to 55%. The left ventricle has normal  function. Left ventricular diastolic parameters are indeterminate.   2. Right ventricular systolic function is  normal. The right ventricular  size is normal.   3. The mitral valve is normal in structure. Trivial mitral valve  regurgitation.   4. The aortic valve is tricuspid. Aortic valve regurgitation is not  visualized. Mild aortic valve sclerosis is present, with no evidence of  aortic valve stenosis.   5. The inferior vena cava is normal in size with greater than 50%  respiratory variability, suggesting right atrial pressure of 3 mmHg.    Event Monitor: 06/2023   14 day monitor   Rare supraventricular ectopy in the form of isolated PACs, couplets, triplets. 11 runs of SVT, longest 10 beats.   Frequent ventricular ectopy in the form isolated PVCs (12.7% burden). Rare couplets and triplets. 5 runs of NSVT longest 11 beats.   Reported symptoms correlated with sinus rhythm with PACs and PVCs     Patch Wear Time:  13 days and 23 hours (2024-07-08T13:36:42-0400 to 2024-07-22T13:36:34-0400)   Patient had a min HR of 40 bpm, max HR of 188 bpm, and avg HR of 69 bpm. Predominant underlying rhythm was Sinus Rhythm. 5 Ventricular Tachycardia runs occurred, the run with the fastest interval lasting 5 beats with a max rate of 188 bpm, the longest  lasting 11 beats with an avg rate of 103 bpm. 11 Supraventricular Tachycardia runs occurred, the run with the fastest interval lasting 5 beats with a max rate of 138 bpm, the longest lasting 10 beats with an avg rate of 104 bpm. Idioventricular Rhythm  was present. Isolated SVEs were rare (<1.0%), SVE Couplets were rare (<1.0%), and SVE  Triplets were rare (<1.0%). Isolated VEs were frequent (12.7%, [id number]), VE Couplets were rare (<1.0%, 2039), and VE Triplets were rare (<1.0%, 31). Ventricular Bigeminy  and Trigeminy were present.  Physical Exam:   VS:  BP (!) 144/66   Pulse 66   Ht 5' (1.524 m)   Wt 160 lb (72.6 kg)   SpO2 97%   BMI 31.25 kg/m    Wt Readings from Last 3 Encounters:  10/06/23 160 lb (72.6 kg)  09/02/23 157 lb (71.2 kg)  08/09/23 156 lb  (70.8 kg)     GEN: Pleasant, elderly female appearing in no acute distress NECK: No JVD; No carotid bruits CARDIAC: RRR, no murmurs, rubs, gallops RESPIRATORY:  Clear to auscultation without rales, wheezing or rhonchi  ABDOMEN: Appears non-distended. No obvious abdominal masses. EXTREMITIES: No clubbing or cyanosis. No pitting edema. Distal pedal pulses are 2+ bilaterally.   Assessment and Plan:   1. CAD/Dyspnea on Exertion - She underwent stenting to the LAD in 2010 per patient's report and had no ischemia by NST in 09/2020. She does report worsening dyspnea on exertion over the past 3 months and this was her prior anginal symptom as well. Reviewed options and will plan for a Myoview for ischemic evaluation.  She wishes to try walking on the treadmill, therefore we will have her hold Lopressor the evening before and morning of her test. Would have a low threshold to switch to a Lexiscan if needed and this was reviewed with the patient today. - Continue ASA 81mg  daily, Imdur 30mg  daily, Lopressor 12.5mg  BID and Crestor 10mg  MWF.  2. Palpitations/PVC's - Her prior monitor in 06/2023 showed SVT with a 12.7% PVC burden. Was evaluated by EP and started on Lopressor 12.5 mg twice daily. Reports that her symptoms have improved with initiation of this and since stopping Ativan.  3. Carotid Artery Stenosis - Followed by Vascular Surgery. Most recent dopplers in 2022 showed 1 to 39% stenosis bilaterally.  She remains on ASA and statin therapy.  4. HTN - BP is elevated at 144/66 during today's visit but has otherwise been well-controlled. For now, continue current medical therapy with Amlodipine 5 mg daily, Benazepril 40 mg daily, Imdur 30 mg daily and Lopressor 12.5 mg twice daily. Pending BP trend, could further titrate Amlodipine to 10 mg daily if needed.  5. HLD - Followed by her PCP and we will request a copy of most recent labs. She remains on Crestor 10 mg three times weekly as she was  previously intolerant to higher intensity statin therapy. Could consider the addition of Zetia if LDL above goal.  Signed, Ellsworth Lennox, PA-C

## 2023-10-12 ENCOUNTER — Other Ambulatory Visit: Payer: Self-pay

## 2023-10-12 ENCOUNTER — Telehealth (HOSPITAL_COMMUNITY): Payer: Self-pay | Admitting: Surgery

## 2023-10-12 DIAGNOSIS — I25118 Atherosclerotic heart disease of native coronary artery with other forms of angina pectoris: Secondary | ICD-10-CM

## 2023-10-12 DIAGNOSIS — R0609 Other forms of dyspnea: Secondary | ICD-10-CM

## 2023-10-12 NOTE — Telephone Encounter (Signed)
I called the pt to remind her of her stress test tomorrow and also to arrive by 9:15 and check in at the front desk.  I also instructed the pt to hold her Metoprolol tonight and tomorrow morning (based on the note from B Strader, PA-C), to wear comfortable clothes and tennis shoes, to not eat or drink 6 hours prior to the test (no caffeine or chocolate), to not smoke 8 hours prior to the test, and to not wear lotions or oils.  The pt verbalized understanding of these instructions.

## 2023-10-13 ENCOUNTER — Ambulatory Visit (HOSPITAL_COMMUNITY)
Admission: RE | Admit: 2023-10-13 | Discharge: 2023-10-13 | Disposition: A | Discharge status: Home / Self Care | Payer: Medicare Other | Source: Ambulatory Visit | Attending: Cardiology | Admitting: Cardiology

## 2023-10-13 ENCOUNTER — Encounter (HOSPITAL_COMMUNITY): Payer: Self-pay

## 2023-10-13 ENCOUNTER — Ambulatory Visit (HOSPITAL_COMMUNITY)
Admission: RE | Admit: 2023-10-13 | Discharge: 2023-10-13 | Disposition: A | Discharge status: Home / Self Care | Payer: Medicare Other | Source: Ambulatory Visit | Attending: Student | Admitting: Student

## 2023-10-13 DIAGNOSIS — R0609 Other forms of dyspnea: Secondary | ICD-10-CM | POA: Insufficient documentation

## 2023-10-13 DIAGNOSIS — I25118 Atherosclerotic heart disease of native coronary artery with other forms of angina pectoris: Secondary | ICD-10-CM | POA: Diagnosis present

## 2023-10-13 LAB — NM MYOCAR MULTI W/SPECT W/WALL MOTION / EF
Angina Index: 0
Duke Treadmill Score: 2
Estimated workload: 4.6
Exercise duration (min): 2 min
Exercise duration (sec): 0 s
LV dias vol: 78 mL (ref 46–106)
LV sys vol: 36 mL
MPHR: 139 {beats}/min
Nuc Stress EF: 54 %
Peak HR: 122 {beats}/min
Percent HR: 87 %
RATE: 0.3
RPE: 11
Rest HR: 94 {beats}/min
Rest Nuclear Isotope Dose: 9.4 mCi
SDS: 3
SRS: 5
SSS: 8
ST Depression (mm): 0 mm
Stress Nuclear Isotope Dose: 27 mCi
TID: 1.12

## 2023-10-13 MED ORDER — SODIUM CHLORIDE FLUSH 0.9 % IV SOLN
INTRAVENOUS | Status: AC
  Administered 2023-10-13: 10 mL via INTRAVENOUS
  Filled 2023-10-13: qty 10

## 2023-10-13 MED ORDER — REGADENOSON 0.4 MG/5ML IV SOLN
INTRAVENOUS | Status: AC
  Filled 2023-10-13: qty 5

## 2023-10-13 MED ORDER — TECHNETIUM TC 99M TETROFOSMIN IV KIT
10.0000 | PACK | Freq: Once | INTRAVENOUS | Status: AC | PRN
  Administered 2023-10-13: 9.4 via INTRAVENOUS

## 2023-10-13 MED ORDER — TECHNETIUM TC 99M TETROFOSMIN IV KIT
30.0000 | PACK | Freq: Once | INTRAVENOUS | Status: AC | PRN
  Administered 2023-10-13: 27 via INTRAVENOUS

## 2023-11-30 ENCOUNTER — Other Ambulatory Visit: Payer: Self-pay | Admitting: Cardiology

## 2023-12-20 ENCOUNTER — Other Ambulatory Visit: Payer: Self-pay

## 2023-12-20 MED ORDER — ISOSORBIDE MONONITRATE ER 30 MG PO TB24: 30.0000 mg | ORAL_TABLET | Freq: Every day | ORAL | 2 refills | Status: DC

## 2024-01-09 ENCOUNTER — Telehealth: Payer: Self-pay | Admitting: Cardiology

## 2024-01-09 NOTE — Telephone Encounter (Signed)
STAT if HR is under 50 or over 120 (normal HR is 60-100 beats per minute)  What is your heart rate? 35  Do you have a log of your heart rate readings (document readings)? No  Do you have any other symptoms? No

## 2024-01-09 NOTE — Telephone Encounter (Signed)
Patient was seen by pulmonology today and was told her HR was 35 bpm. This was by pulse ox. They did not do an EKG or take 60 second apical pulse. Patient feels fine but would like to be seen. She has no symptoms at this time . Appointment is available at the [facility] office tomorrow with E.Peck,NP at 4 pm

## 2024-01-10 ENCOUNTER — Encounter: Payer: Self-pay | Admitting: Nurse Practitioner

## 2024-01-10 ENCOUNTER — Ambulatory Visit: Payer: Medicare Other | Attending: Nurse Practitioner | Admitting: Nurse Practitioner

## 2024-01-10 VITALS — BP 138/80 | HR 72 | Ht 60.0 in | Wt 158.0 lb

## 2024-01-10 DIAGNOSIS — I6523 Occlusion and stenosis of bilateral carotid arteries: Secondary | ICD-10-CM | POA: Diagnosis present

## 2024-01-10 DIAGNOSIS — E785 Hyperlipidemia, unspecified: Secondary | ICD-10-CM | POA: Insufficient documentation

## 2024-01-10 DIAGNOSIS — I493 Ventricular premature depolarization: Secondary | ICD-10-CM | POA: Insufficient documentation

## 2024-01-10 DIAGNOSIS — G4733 Obstructive sleep apnea (adult) (pediatric): Secondary | ICD-10-CM | POA: Diagnosis present

## 2024-01-10 DIAGNOSIS — I10 Essential (primary) hypertension: Secondary | ICD-10-CM | POA: Diagnosis not present

## 2024-01-10 DIAGNOSIS — R002 Palpitations: Secondary | ICD-10-CM | POA: Diagnosis present

## 2024-01-10 DIAGNOSIS — R5383 Other fatigue: Secondary | ICD-10-CM | POA: Diagnosis present

## 2024-01-10 DIAGNOSIS — I2510 Atherosclerotic heart disease of native coronary artery without angina pectoris: Secondary | ICD-10-CM | POA: Diagnosis present

## 2024-01-10 MED ORDER — ISOSORBIDE MONONITRATE ER 30 MG PO TB24: 15.0000 mg | ORAL_TABLET | Freq: Every day | ORAL | 1 refills | Status: DC

## 2024-01-10 NOTE — Progress Notes (Unsigned)
Cardiology Office Note:  .   Date:  01/10/2024 ID:  Melinda Acevedo, DOB [birth year], MRN [id number] PCP: Jonathon Bellows, DO  [facility] Providers Cardiologist:  Dina Rich, MD    History of Present Illness: Melinda Acevedo is a 83 y.o. female with a PMH of CAD, hypertension, hyperlipidemia, palpitations, carotid artery stenosis, and OSA on CPAP, who presents today for bradycardia evaluation.  Previous cardiovascular history of stenting to LAD in 2010.  NST in 2021 was negative for ischemia.  Cardiac monitor in July 2024 showed SVT with 12.7% PVC burden.  Has been followed by EP.  Last seen by Randall An, PA-C on October 06, 2023.  At that time, she noted improvement in her palpitations since being started on Lopressor as well as stopping Ativan.  Denied any chest pain at the time, was using her CPAP consistently.  She contacted our office yesterday and spoke with our nurse and stated at pulmonology follow-up, heart rate was noted to be 35 bpm by pulse oximeter.  EKG was not performed and did not take apical pulse.  Patient stated she felt fine, denied any symptoms.  Today she presents for bradycardia evaluation.  She states she believes her heart rate read falsely low due to her history of PVCs.  She states she feels more fatigued since starting on metoprolol then before she started this medicine.  She notes stable palpitations.  Wonders if her isosorbide mononitrate dosage should be reduced to help improve her fatigue. Denies any chest pain, shortness of breath, syncope, presyncope, dizziness, orthopnea, PND, swelling or significant weight changes, acute bleeding, or claudication.  ROS: Negative. See HPI.   Studies Reviewed: Marland Kitchen    EKG:  EKG Interpretation Date/Time:  Tuesday January 10 2024 16:05:51 EST Ventricular Rate:  75 PR Interval:  156 QRS Duration:  76 QT Interval:  404 QTC Calculation: 451 R Axis:   39  Text Interpretation: Sinus rhythm with frequent  Premature ventricular complexes Septal infarct (cited on or before 27-Jun-2023) When compared with ECG of 27-Jun-2023 13:04, No significant change was found Confirmed by Sharlene Dory [phone]) on 01/10/2024 4:09:23 PM   Lexiscan 09/2023:   Findings are equivocal. The study is intermediate risk.   Stage 1 was reached after exercising for 2 min and 0 sec. Maximum HR of 122 bpm. MPHR 87.0%. Peak METS 4.6. Hypertensive blood pressure and exaggerated heart rate response noted during stress.   No ST deviation was noted. The ECG was negative for ischemia.  Intermediate risk Duke treadmill score of 2 based on limited exercise time.   LV perfusion is equivocal.  Small, mild intensity, partially reversible mid anteroseptal defect most suggestive of variable breast attenuation artifact, however cannot entirely exclude an ischemic territory.   Left ventricular function is normal. Nuclear stress EF: 54%.   Intermediate risk study, Duke treadmill score of 2 based on limited exercise time.  No diagnostic ST segment changes, frequent PVCs noted.  Equivocal mid anteroseptal perfusion defect suggestive of either variable breast attenuation or potentially ischemic territory.  LVEF 54%.   Cardiac monitor 07/2023:   14 day monitor   Rare supraventricular ectopy in the form of isolated PACs, couplets, triplets. 11 runs of SVT, longest 10 beats.   Frequent ventricular ectopy in the form isolated PVCs (12.7% burden). Rare couplets and triplets. 5 runs of NSVT longest 11 beats.   Reported symptoms correlated with sinus rhythm with PACs and PVCs     Patch Wear Time:  13 days  and 23 hours (2024-07-08T13:36:42-0400 to 2024-07-22T13:36:34-0400)   Patient had a min HR of 40 bpm, max HR of 188 bpm, and avg HR of 69 bpm. Predominant underlying rhythm was Sinus Rhythm. 5 Ventricular Tachycardia runs occurred, the run with the fastest interval lasting 5 beats with a max rate of 188 bpm, the longest  lasting 11 beats with an avg  rate of 103 bpm. 11 Supraventricular Tachycardia runs occurred, the run with the fastest interval lasting 5 beats with a max rate of 138 bpm, the longest lasting 10 beats with an avg rate of 104 bpm. Idioventricular Rhythm  was present. Isolated SVEs were rare (<1.0%), SVE Couplets were rare (<1.0%), and SVE Triplets were rare (<1.0%). Isolated VEs were frequent (12.7%, [id number]), VE Couplets were rare (<1.0%, 2039), and VE Triplets were rare (<1.0%, 31). Ventricular Bigeminy  and Trigeminy were present.  Echocardiogram 03/2021: 1. Basal inferior/inferoseptal hypokinesis. . Left ventricular ejection  fraction, by estimation, is 50 to 55%. The left ventricle has normal  function. Left ventricular diastolic parameters are indeterminate.   2. Right ventricular systolic function is normal. The right ventricular  size is normal.   3. The mitral valve is normal in structure. Trivial mitral valve  regurgitation.   4. The aortic valve is tricuspid. Aortic valve regurgitation is not  visualized. Mild aortic valve sclerosis is present, with no evidence of  aortic valve stenosis.   5. The inferior vena cava is normal in size with greater than 50%  respiratory variability, suggesting right atrial pressure of 3 mmHg.  Carotid duplex 02/2021: Summary:  Right Carotid: Velocities in the right ICA are consistent with a 1-39%  stenosis.   Left Carotid: Velocities in the left ICA are consistent with a 1-39%  stenosis.   Vertebrals: Bilateral vertebral arteries demonstrate antegrade flow.    Physical Exam:   VS:  BP 138/80   Pulse 72   Ht 5' (1.524 m)   Wt 158 lb (71.7 kg)   SpO2 98%   BMI 30.86 kg/m    Wt Readings from Last 3 Encounters:  01/10/24 158 lb (71.7 kg)  10/06/23 160 lb (72.6 kg)  09/02/23 157 lb (71.2 kg)    GEN: Well nourished, well developed in no acute distress NECK: No JVD; No carotid bruits CARDIAC: S1/S2, early beats noted during auscultation, no murmurs, rubs,  gallops RESPIRATORY:  Clear to auscultation without rales, wheezing or rhonchi  ABDOMEN: Soft, non-tender, non-distended EXTREMITIES:  No edema; No deformity   ASSESSMENT AND PLAN: .    1.  Palpitations, frequent PVCs Reports stable palpitations with some improvement since being started on metoprolol tartrate, however notes fatigue. Pt noted to have pseudo bradycardia d/t frequent PVC's on pulse ox at pulmonology office. EKG confirms sinus rhythm with frequent PVCs, also noted on exam.  Did discuss increasing Lopressor to 25 mg twice daily to help improve her palpitations, however patient declines.  Continue Lopressor 12.5 mg twice daily and will decrease Imdur to 15 mg daily to improve fatigue-see below.  No other medication changes at this time.  Continue follow-up with EP.   2.  Hypertension Blood pressure stable and at goal. Discussed to monitor BP at home at least 2 hours after medications and sitting for 5-10 minutes.  Decreasing Imdur as mentioned below.  No other medication changes at this time. Heart healthy diet and regular cardiovascular exercise encouraged.   3.  CAD, Hyperlipidemia Stable with no anginal symptoms. No indication for ischemic evaluation. LDL 133 from July  2024.  She has been intolerant to higher doses of statin therapy.  She is tolerating Crestor 10 mg 3 times weekly.  Continue current medication regimen.  Will request most recent labs from PCP's office. Heart healthy diet and regular cardiovascular exercise encouraged.   4.  Carotid artery stenosis Carotid duplex in March 2022 revealed mild bilateral ICA stenosis.  Patient denies any symptoms.  Continue current medication regimen.  5.  OSA on CPAP Encouraged continued compliance.  6.  Fatigue Etiology multifactorial.  Will reduce isosorbide mononitrate to 50 mg daily.  No other medication changes at this time.  Continue follow-up with PCP to rule out noncardiac causes of fatigue. Will request most recent labs.     Dispo: Care and ED precautions discussed. Patient is requesting to follow-up with Dr. Wyline Mood as scheduled.  Follow-up with me/[facility] sooner if anything changes.  Signed, Sharlene Dory, NP

## 2024-01-10 NOTE — Patient Instructions (Addendum)
Medication Instructions:  Your physician has recommended you make the following change in your medication:  Please reduce IMDUR to 15 Mg daily   Labwork: None   Testing/Procedures: None   Follow-Up: Your physician recommends that you schedule a follow-up appointment in: as scheduled   Any Other Special Instructions Will Be Listed Below (If Applicable).  If you need a refill on your cardiac medications before your next appointment, please call your pharmacy.

## 2024-01-25 ENCOUNTER — Telehealth: Payer: Self-pay | Admitting: Cardiology

## 2024-01-25 NOTE — Telephone Encounter (Deleted)
 Called to reschedule this pt from 02/14/24 per Dr. Alvan as he will not be in office this day. The pt is very unhappy that she will not be seen. She requested an MD only appointment. Dr. Ranae first available will be in June and she does not want to wait that long. She asked about Dr. Adrian first available here in [place] and it is March. She stated that she needs to see someone before then. She will call back to see if she can see Dr. Waddell in [place]. The pt stated that she wants to find another doctor.

## 2024-01-25 NOTE — Telephone Encounter (Signed)
 error

## 2024-02-09 ENCOUNTER — Ambulatory Visit: Payer: Medicare Other | Admitting: Cardiology

## 2024-02-14 ENCOUNTER — Ambulatory Visit: Payer: Medicare Other | Admitting: Cardiology

## 2024-03-13 ENCOUNTER — Ambulatory Visit: Payer: Medicare Other | Admitting: Internal Medicine

## 2024-05-07 ENCOUNTER — Ambulatory Visit: Attending: Internal Medicine | Admitting: Internal Medicine

## 2024-05-07 ENCOUNTER — Encounter: Payer: Self-pay | Admitting: Internal Medicine

## 2024-05-07 VITALS — BP 162/50 | HR 58 | Ht 60.0 in | Wt 168.0 lb

## 2024-05-07 DIAGNOSIS — R0609 Other forms of dyspnea: Secondary | ICD-10-CM | POA: Insufficient documentation

## 2024-05-07 DIAGNOSIS — R002 Palpitations: Secondary | ICD-10-CM | POA: Insufficient documentation

## 2024-05-07 NOTE — Patient Instructions (Addendum)
 Medication Instructions:  Your physician recommends that you continue on your current medications as directed. Please refer to the Current Medication list given to you today.  *If you need a refill on your cardiac medications before your next appointment, please call your pharmacy*  Lab Work: None ordered.  You may go to any [facility] Location for your lab work:  [facility] - [address] ([facility]) - [address] [phone] [address]  [place] - [address]   [place]  - [address]   [place] - [address] - [address] [employer]  - [address] ([facility]   If you have labs (blood work) drawn today and your tests are completely normal, you will receive your results only by: [employer] (if you have MyChart)  If you have any lab test that is abnormal or we need to change your treatment, we will call you or send a MyChart message to review the results.  Testing/Procedures:  Your physician has requested that you have an echocardiogram. Echocardiography is a painless test that uses sound waves to create images of your heart. It provides your doctor with information about the size and shape of your heart and how well your heart's chambers and valves are working. This procedure takes approximately one hour. There are no restrictions for this procedure. Please do NOT wear cologne, perfume, aftershave, or lotions (deodorant is allowed). Please arrive 15 minutes prior to your appointment time.  Please note: We ask at that you not bring children with you during ultrasound (echo/ vascular) testing. Due to room size and safety concerns, children are not allowed in the ultrasound rooms during exams. Our front office staff cannot provide observation of children in our lobby area while testing is being conducted. An adult accompanying a  patient to their appointment will only be allowed in the ultrasound room at the discretion of the ultrasound technician under special circumstances. We apologize for any inconvenience.   Follow-Up: At [facility], you and your health needs are our priority.  As part of our continuing mission to provide you with exceptional heart care, we have created designated Provider Care Teams.  These Care Teams include your primary Cardiologist (physician) and Advanced Practice Providers (APPs -  Physician Assistants and Nurse Practitioners) who all work together to provide you with the care you need, when you need it.   Your next appointment:   Based on testing results  The format for your next appointment:   In Person  Provider:   Manya Sells, MD{or one of the following Advanced Practice Providers on your designated Care Team:   Mertha Abrahams, [place] [address] [place], [place] Neda Balk, NP

## 2024-05-07 NOTE — Progress Notes (Signed)
 HPI Mrs. Lienhard returns for ongoing evaluation of PVC's and NS AT. She is pleasant 83 yo woman, formely seen in [place] who has a h/o CAD with LAD stenting remotely. She notes that her heart skips beats. She has not had syncope. Her PVC burden was 12%. Since I saw her last she notes her dyspnea has worsened along with more fatigue. She did not tolerate the lopressor . "It made me feel worse." Her main complaints is worsening dyspnea and fatigue. Allergies  Allergen Reactions   Alendronate     Made bones hurt   Cinoxacin Hives   Contrast Media [Iodinated Contrast Media] Diarrhea    Only kidney ID dyes   Diltiazem     "made her feel like she was in a cloud"   Keflex [Cephalexin] Hives   Naproxen Other (See Comments)    Stomach upset   Nifedipine Other (See Comments)    Unknown reaction   Norfloxacin Hives   Pravastatin Sodium Other (See Comments)    Stomach cramps   Trimethoprim Hives   Valdecoxib Other (See Comments)    Lower belly pain     Current Outpatient Medications  Medication Sig Dispense Refill   amLODipine  (NORVASC ) 5 MG tablet Take 1 tablet by mouth once daily 90 tablet 3   Apple Cider Vinegar 500 MG TABS Take 500 mg by mouth daily.     benazepril  (LOTENSIN ) 40 MG tablet Take 1 tablet (40 mg total) by mouth daily. 90 tablet 3   Cholecalciferol (VITAMIN D) 125 MCG (5000 UT) CAPS Take 5,000 Units by mouth daily. Gummy     Coenzyme Q10 (COQ10) 200 MG CAPS Take 400 mg by mouth daily. Gummy     CRESTOR  10 MG tablet TAKE 1 TABLET BY MOUTH ON MONDAY, WEDNESDAY AND FRIDAY 30 tablet 6   estradiol (ESTRACE) 0.1 MG/GM vaginal cream Place 1 g vaginally 2 (two) times a week.     isosorbide  mononitrate (IMDUR ) 30 MG 24 hr tablet Take 0.5 tablets (15 mg total) by mouth daily. 45 tablet 1   loratadine (CLARITIN) 10 MG tablet Take 10 mg by mouth every evening.     LORazepam  (ATIVAN ) 0.5 MG tablet Take 0.5 mg by mouth 3 (three) times daily as needed for anxiety.     Misc Natural  Products (ELDERBERRY ZINC /VIT C/IMMUNE MT) Take 1 tablet by mouth daily.     Multiple Vitamins-Minerals (ADULT ONE DAILY GUMMIES) CHEW Chew 2 tablets by mouth daily.     nitrofurantoin  (MACRODANTIN ) 50 MG capsule Take 50 mg by mouth at bedtime.     nitroGLYCERIN  (NITROSTAT ) 0.4 MG SL tablet Place 0.4 mg under the tongue every 5 (five) minutes as needed for chest pain.     ondansetron  (ZOFRAN -ODT) 4 MG disintegrating tablet Take 4 mg by mouth every 8 (eight) hours as needed for nausea or vomiting.     RABEprazole (ACIPHEX) 20 MG tablet Take 20 mg by mouth every evening.     Travoprost, BAK Free, (TRAVATAN) 0.004 % SOLN ophthalmic solution Place 1 drop into both eyes at bedtime.     vitamin B-12 (CYANOCOBALAMIN) 1000 MCG tablet Take 1,000 mcg by mouth daily. Gummy     No current facility-administered medications for this visit.     Past Medical History:  Diagnosis Date   Anxiety    CAD (coronary artery disease)    a. s/p stenting to LAD in 2010 per patient's report b. no ischemia by NST in 09/2020   Carotid artery  occlusion    Diabetes mellitus without complication (HCC)    Glaucoma    History of hiatal hernia    Hyperlipidemia    Hypertension    Irregular heart beats    Seasonal allergies    Sleep apnea     ROS:   All systems reviewed and negative except as noted in the HPI.   Past Surgical History:  Procedure Laterality Date   ABDOMINAL HYSTERECTOMY     CATARACT EXTRACTION Bilateral    CHOLECYSTECTOMY     COLONOSCOPY  2017   Dr.Williams in [place] , [place]   COLONOSCOPY WITH PROPOFOL  N/A 02/06/2021   Procedure: COLONOSCOPY WITH PROPOFOL ;  Surgeon: Urban Garden, MD;  [facility];  Service: Gastroenterology;  Laterality: N/A;  10:30   COLONOSCOPY WITH PROPOFOL  N/A 09/29/2021   Procedure: COLONOSCOPY WITH PROPOFOL ;  Surgeon: Urban Garden, MD;  [facility];  Service: Gastroenterology;  Laterality: N/A;  10:05   CORONARY  ANGIOPLASTY     stent   ESOPHAGOGASTRODUODENOSCOPY (EGD) WITH PROPOFOL  N/A 02/06/2021   Procedure: ESOPHAGOGASTRODUODENOSCOPY (EGD) WITH PROPOFOL ;  Surgeon: Urban Garden, MD;  [facility];  Service: Gastroenterology;  Laterality: N/A;   ESOPHAGOGASTRODUODENOSCOPY (EGD) WITH PROPOFOL  N/A 04/17/2021   Procedure: ESOPHAGOGASTRODUODENOSCOPY (EGD) WITH PROPOFOL ;  Surgeon: Alanda Allegra, MD;  Location: [facility];  Service: General;  Laterality: N/A;   ESOPHAGOGASTRODUODENOSCOPY (EGD) WITH PROPOFOL  N/A 05/14/2021   Procedure: ESOPHAGOGASTRODUODENOSCOPY (EGD) WITH PROPOFOL ;  Surgeon: Alanda Allegra, MD;  Location: [facility];  Service: General;  Laterality: N/A;   heart catherization  [id number],9604, 2010   IR GASTROSTOMY TUBE REMOVAL  05/13/2021   PEG PLACEMENT N/A 04/17/2021   Procedure: PERCUTANEOUS ENDOSCOPIC GASTROSTOMY (PEG) PLACEMENT;  Surgeon: Alanda Allegra, MD;  Location: [facility];  Service: General;  Laterality: N/A;   PEG PLACEMENT N/A 05/14/2021   Procedure: PERCUTANEOUS ENDOSCOPIC GASTROSTOMY (PEG) PLACEMENT;  Surgeon: Alanda Allegra, MD;  Location: [facility];  Service: General;  Laterality: N/A;   PEG PLACEMENT N/A 11/10/2022   Procedure: PERCUTANEOUS GASTROSTOMY (PEG) PLACEMENT WITHOUT ENDOSCOPY - REPLACEMENT;  Surgeon: Alanda Allegra, MD;  [facility];  Service: Gastroenterology;  Laterality: N/A;   POLYPECTOMY  09/29/2021   Procedure: POLYPECTOMY;  Surgeon: Umberto Ganong, Bearl Limes, MD;  Location[facility];  Service: Gastroenterology;;   RECTOCELE REPAIR     TONSILLECTOMY     TUBAL LIGATION  1986   UPPER GASTROINTESTINAL ENDOSCOPY  2015   Dr.Williams in [place],    VAGINAL HYSTERECTOMY  1991     Family History  Problem Relation Age of Onset   COPD Mother    Heart disease Mother    Heart disease Father    Heart attack Father    Hypertension Sister    Mitral valve prolapse Brother      Social History   Socioeconomic History   Marital status:  Married    Spouse name: Not on file   Number of children: Not on file   Years of education: Not on file   Highest education level: Not on file  Occupational History   Not on file  Tobacco Use   Smoking status: Former    Current packs/day: 0.00    Types: Cigarettes    Start date: 29    Quit date: 1964    Years since quitting: 61.4   Smokeless tobacco: Never  Vaping Use   Vaping status: Never Used  Substance and Sexual Activity   Alcohol use: No   Drug use:  No   Sexual activity: Not on file  Other Topics Concern   Not on file  Social History Narrative   Not on file   Social Drivers of Health   Financial Resource Strain: Not on file  Food Insecurity: Not on file  Transportation Needs: Not on file  Physical Activity: Not on file  Stress: Not on file  Social Connections: Not on file  Intimate Partner Violence: Not on file     BP (!) 162/50   Pulse (!) 58   Ht 5' (1.524 m)   Wt 168 lb (76.2 kg)   SpO2 96%   BMI 32.81 kg/m   Physical Exam:  Well appearing NAD HEENT: Unremarkable Neck:  No JVD, no thyromegally Lymphatics:  No adenopathy Back:  No CVA tenderness Lungs:  Clear with no wheezes HEART:  IRegular rate rhythm, no murmurs, no rubs, no clicks Abd:  soft, positive bowel sounds, no organomegally, no rebound, no guarding Ext:  2 plus pulses, no edema, no cyanosis, no clubbing Skin:  No rashes no nodules Neuro:  CN II through XII intact, motor grossly intact  EKG - NSR with multiple PVC's with 3 morphologies.  Assess/Plan:  Palpitations - she was intolerant of low dose metoprolol . With her h/o PCI stenting, not a great candidate for flecainide. I might try Mexitil.  Dyspnea and fatigue - this could be her anginal equivalent. I will plan a 2D echo. If her EF is down, left heart cath. If not will try Mexitil.  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 We will follow.   Pete Brand Felecia Stanfill,MD

## 2024-06-11 ENCOUNTER — Ambulatory Visit: Attending: Internal Medicine

## 2024-06-11 DIAGNOSIS — R0609 Other forms of dyspnea: Secondary | ICD-10-CM | POA: Diagnosis present

## 2024-06-12 LAB — ECHOCARDIOGRAM COMPLETE
AR max vel: 1.85 cm2
AV Area VTI: 2.07 cm2
AV Area mean vel: 2.08 cm2
AV Mean grad: 6 mmHg
AV Peak grad: 13.1 mmHg
Ao pk vel: 1.81 m/s
Area-P 1/2: 3.72 cm2
Calc EF: 56.7 %
Est EF: 55
MV VTI: 2.18 cm2
S' Lateral: 3.2 cm
Single Plane A2C EF: 60.5 %
Single Plane A4C EF: 55.7 %

## 2024-06-18 ENCOUNTER — Other Ambulatory Visit: Payer: Self-pay | Admitting: Cardiology

## 2024-06-18 ENCOUNTER — Ambulatory Visit: Payer: Self-pay | Admitting: Internal Medicine

## 2024-08-29 ENCOUNTER — Encounter: Payer: Self-pay | Admitting: Internal Medicine

## 2024-08-29 ENCOUNTER — Ambulatory Visit: Admitting: Internal Medicine

## 2024-08-29 ENCOUNTER — Other Ambulatory Visit (HOSPITAL_COMMUNITY)
Admission: RE | Admit: 2024-08-29 | Discharge: 2024-08-29 | Disposition: A | Discharge status: Home / Self Care | Source: Ambulatory Visit | Attending: Internal Medicine | Admitting: Internal Medicine

## 2024-08-29 VITALS — BP 140/60 | HR 51 | Ht 60.0 in | Wt 155.6 lb

## 2024-08-29 DIAGNOSIS — Z01810 Encounter for preprocedural cardiovascular examination: Secondary | ICD-10-CM

## 2024-08-29 DIAGNOSIS — R0609 Other forms of dyspnea: Secondary | ICD-10-CM | POA: Insufficient documentation

## 2024-08-29 LAB — BASIC METABOLIC PANEL WITH GFR
Anion gap: 16 — ABNORMAL HIGH (ref 5–15)
BUN: 18 mg/dL (ref 8–23)
CO2: 25 mmol/L (ref 22–32)
Calcium: 9.5 mg/dL (ref 8.9–10.3)
Chloride: 100 mmol/L (ref 98–111)
Creatinine, Ser: 0.61 mg/dL (ref 0.44–1.00)
GFR, Estimated: >60 mL/min (ref >60)
Glucose, Bld: 109 mg/dL — ABNORMAL HIGH (ref 70–99)
Potassium: 3.7 mmol/L (ref 3.5–5.1)
Sodium: 141 mmol/L (ref 135–145)

## 2024-08-29 LAB — CBC
HCT: 43.4 % (ref 36.0–46.0)
Hemoglobin: 13.8 g/dL (ref 12.0–15.0)
MCH: 31.2 pg (ref 26.0–34.0)
MCHC: 31.8 g/dL (ref 30.0–36.0)
MCV: 98 fL (ref 80.0–100.0)
Platelets: 303 K/uL (ref 150–400)
RBC: 4.43 MIL/uL (ref 3.87–5.11)
RDW: 14.6 % (ref 11.5–15.5)
WBC: 10.2 K/uL (ref 4.0–10.5)
nRBC: 0 % (ref 0.0–0.2)

## 2024-08-29 MED ORDER — PREDNISONE 50 MG PO TABS: ORAL_TABLET | ORAL | 0 refills | Status: DC

## 2024-08-29 MED ORDER — DIPHENHYDRAMINE HCL 50 MG PO TABS
ORAL_TABLET | ORAL | 0 refills | Status: DC
Start: 2024-08-29 — End: 2024-09-17

## 2024-08-29 NOTE — Progress Notes (Signed)
 HPI Melinda Acevedo returns for ongoing evaluation of PVC's and NS AT. She is pleasant 83 yo woman, formely seen in [place] who has a h/o CAD with LAD stenting remotely. She notes that when she had her PCI 15 years ago she never had chest pain and had a very tight LAD. She has not had syncope.  Her main complaints is worsening dyspnea and fatigue. She thinks that her symptoms are just like she had 15 years ago before she had her PCI. She has never had a heart cath since her PCI.  Allergies  Allergen Reactions   Alendronate     Made bones hurt   Cinoxacin Hives   Contrast Media [Iodinated Contrast Media] Diarrhea    Only kidney ID dyes   Diltiazem     made her feel like she was in a cloud   Keflex [Cephalexin] Hives   Naproxen Other (See Comments)    Stomach upset   Nifedipine Other (See Comments)    Unknown reaction   Norfloxacin Hives   Pravastatin Sodium Other (See Comments)    Stomach cramps   Trimethoprim Hives   Valdecoxib Other (See Comments)    Lower belly pain     Current Outpatient Medications  Medication Sig Dispense Refill   amLODipine  (NORVASC ) 5 MG tablet Take 1 tablet by mouth once daily 90 tablet 3   Apple Cider Vinegar 500 MG TABS Take 500 mg by mouth daily.     benazepril  (LOTENSIN ) 40 MG tablet Take 1 tablet by mouth once daily 90 tablet 3   Coenzyme Q10 (COQ10) 200 MG CAPS Take 400 mg by mouth daily. Gummy     CRESTOR  10 MG tablet TAKE 1 TABLET BY MOUTH ON MONDAY, WEDNESDAY AND FRIDAY 30 tablet 6   estradiol (ESTRACE) 0.1 MG/GM vaginal cream Place 1 g vaginally 2 (two) times a week.     isosorbide  mononitrate (IMDUR ) 30 MG 24 hr tablet Take 0.5 tablets (15 mg total) by mouth daily. 45 tablet 1   loratadine (CLARITIN) 10 MG tablet Take 10 mg by mouth every evening.     LORazepam  (ATIVAN ) 0.5 MG tablet Take 0.5 mg by mouth 3 (three) times daily as needed for anxiety.     Multiple Vitamins-Minerals (ADULT ONE DAILY GUMMIES) CHEW Chew 2 tablets by mouth  daily.     nitrofurantoin  (MACRODANTIN ) 50 MG capsule Take 50 mg by mouth at bedtime.     nitroGLYCERIN  (NITROSTAT ) 0.4 MG SL tablet Place 0.4 mg under the tongue every 5 (five) minutes as needed for chest pain.     ondansetron  (ZOFRAN -ODT) 4 MG disintegrating tablet Take 4 mg by mouth every 8 (eight) hours as needed for nausea or vomiting.     RABEprazole (ACIPHEX) 20 MG tablet Take 20 mg by mouth every evening.     Travoprost, BAK Free, (TRAVATAN) 0.004 % SOLN ophthalmic solution Place 1 drop into both eyes at bedtime.     Cholecalciferol (VITAMIN D) 125 MCG (5000 UT) CAPS Take 5,000 Units by mouth daily. Gummy (Patient not taking: Reported on 08/29/2024)     Misc Natural Products (ELDERBERRY ZINC /VIT C/IMMUNE MT) Take 1 tablet by mouth daily. (Patient not taking: Reported on 08/29/2024)     vitamin B-12 (CYANOCOBALAMIN) 1000 MCG tablet Take 1,000 mcg by mouth daily. Gummy (Patient not taking: Reported on 08/29/2024)     No current facility-administered medications for this visit.     Past Medical History:  Diagnosis Date   Anxiety  CAD (coronary artery disease)    a. s/p stenting to LAD in 2010 per patient's report b. no ischemia by NST in 09/2020   Carotid artery occlusion    Diabetes mellitus without complication (HCC)    Glaucoma    History of hiatal hernia    Hyperlipidemia    Hypertension    Irregular heart beats    Seasonal allergies    Sleep apnea     ROS:   All systems reviewed and negative except as noted in the HPI.   Past Surgical History:  Procedure Laterality Date   ABDOMINAL HYSTERECTOMY     CATARACT EXTRACTION Bilateral    CHOLECYSTECTOMY     COLONOSCOPY  2017   Dr.Williams in [place] , [place]   COLONOSCOPY WITH PROPOFOL  N/A 02/06/2021   Procedure: COLONOSCOPY WITH PROPOFOL ;  Surgeon: Eartha Angelia Sieving, MD;  [facility];  Service: Gastroenterology;  Laterality: N/A;  10:30   COLONOSCOPY WITH PROPOFOL  N/A 09/29/2021   Procedure:  COLONOSCOPY WITH PROPOFOL ;  Surgeon: Eartha Angelia Sieving, MD;  [facility];  Service: Gastroenterology;  Laterality: N/A;  10:05   CORONARY ANGIOPLASTY     stent   ESOPHAGOGASTRODUODENOSCOPY (EGD) WITH PROPOFOL  N/A 02/06/2021   Procedure: ESOPHAGOGASTRODUODENOSCOPY (EGD) WITH PROPOFOL ;  Surgeon: Eartha Angelia Sieving, MD;  [facility];  Service: Gastroenterology;  Laterality: N/A;   ESOPHAGOGASTRODUODENOSCOPY (EGD) WITH PROPOFOL  N/A 04/17/2021   Procedure: ESOPHAGOGASTRODUODENOSCOPY (EGD) WITH PROPOFOL ;  Surgeon: Mavis Anes, MD;  Location: [facility];  Service: General;  Laterality: N/A;   ESOPHAGOGASTRODUODENOSCOPY (EGD) WITH PROPOFOL  N/A 05/14/2021   Procedure: ESOPHAGOGASTRODUODENOSCOPY (EGD) WITH PROPOFOL ;  Surgeon: Mavis Anes, MD;  Location: [facility];  Service: General;  Laterality: N/A;   heart catherization  8010,8000, 2010   IR GASTROSTOMY TUBE REMOVAL  05/13/2021   PEG PLACEMENT N/A 04/17/2021   Procedure: PERCUTANEOUS ENDOSCOPIC GASTROSTOMY (PEG) PLACEMENT;  Surgeon: Mavis Anes, MD;  Location: [facility];  Service: General;  Laterality: N/A;   PEG PLACEMENT N/A 05/14/2021   Procedure: PERCUTANEOUS ENDOSCOPIC GASTROSTOMY (PEG) PLACEMENT;  Surgeon: Mavis Anes, MD;  Location[facility];  Service: General;  Laterality: N/A;   PEG PLACEMENT N/A 11/10/2022   Procedure: PERCUTANEOUS GASTROSTOMY (PEG) PLACEMENT WITHOUT ENDOSCOPY - REPLACEMENT;  Surgeon: Mavis Anes, MD;  [facility];  Service: Gastroenterology;  Laterality: N/A;   POLYPECTOMY  09/29/2021   Procedure: POLYPECTOMY;  Surgeon: Eartha Angelia, Sieving, MD;  [facility];  Service: Gastroenterology;;   RECTOCELE REPAIR     TONSILLECTOMY     TUBAL LIGATION  1986   UPPER GASTROINTESTINAL ENDOSCOPY  2015   Dr.Williams in [place], [place]   VAGINAL HYSTERECTOMY  1991     Family History  Problem Relation Age of Onset   COPD Mother    Heart disease Mother    Heart disease  Father    Heart attack Father    Hypertension Sister    Mitral valve prolapse Brother      Social History   Socioeconomic History   Marital status: Married    Spouse name: Not on file   Number of children: Not on file   Years of education: Not on file   Highest education level: Not on file  Occupational History   Not on file  Tobacco Use   Smoking status: Former    Current packs/day: 0.00    Types: Cigarettes    Start date: 81    Quit date: 1964    Years since quitting: 69.7  Smokeless tobacco: Never  Vaping Use   Vaping status: Never Used  Substance and Sexual Activity   Alcohol use: No   Drug use: No   Sexual activity: Not on file  Other Topics Concern   Not on file  Social History Narrative   Not on file   Social Drivers of Health   Financial Resource Strain: Not on file  Food Insecurity: Not on file  Transportation Needs: Not on file  Physical Activity: Not on file  Stress: Not on file  Social Connections: Not on file  Intimate Partner Violence: Not on file     Ht 5' (1.524 m)   Wt 155 lb 9.6 oz (70.6 kg)   BMI 30.39 kg/m   Physical Exam:  Well appearing NAD HEENT: Unremarkable Neck:  No JVD, no thyromegally Lymphatics:  No adenopathy Back:  No CVA tenderness Lungs:  Clear with no wheezes HEART:  Regular rate rhythm, no murmurs, no rubs, no clicks Abd:  soft, positive bowel sounds, no organomegally, no rebound, no guarding Ext:  2 plus pulses, no edema, no cyanosis, no clubbing Skin:  No rashes no nodules Neuro:  CN II through XII intact, motor grossly intact  EKG - nsr  Assess/Plan:  Palpitations - she was intolerant of low dose metoprolol . With her h/o PCI stenting, not a candidate for flecainide. I might try Mexitil if her symptoms of PVC's worsen. Dyspnea and fatigue - this could be her anginal equivalent. Her EF was ok by echo. I have asked her to undergo left heart cath as her symptoms are just like she had prior to her PCI.     Danelle Zyair Russi,MD

## 2024-08-29 NOTE — Patient Instructions (Addendum)
 Medication Instructions:  Your physician recommends that you continue on your current medications as directed. Please refer to the Current Medication list given to you today.  Take Prednisone  and Benadryl  at prescribed.   *If you need a refill on your cardiac medications before your next appointment, please call your pharmacy*  Lab Work: Your physician recommends that you return for lab work in: Today   If you have labs (blood work) drawn today and your tests are completely normal, you will receive your results only by: MyChart Message (if you have MyChart) OR A paper copy in the mail If you have any lab test that is abnormal or we need to change your treatment, we will call you to review the results.  Testing/Procedures: Your physician has requested that you have a cardiac catheterization. Cardiac catheterization is used to diagnose and/or treat various heart conditions. Doctors may recommend this procedure for a number of different reasons. The most common reason is to evaluate chest pain. Chest pain can be a symptom of coronary artery disease (CAD), and cardiac catheterization can show whether plaque is narrowing or blocking your heart's arteries. This procedure is also used to evaluate the valves, as well as measure the blood flow and oxygen levels in different parts of your heart. For further information please visit [email or web address] Please follow instruction sheet, as given.   Follow-Up: At [facility], you and your health needs are our priority.  As part of our continuing mission to provide you with exceptional heart care, our providers are all part of one team.  This team includes your primary Cardiologist (physician) and Advanced Practice Providers or APPs (Physician Assistants and Nurse Practitioners) who all work together to provide you with the care you need, when you need it.  Your next appointment:   1 month(s)  Provider:   Danelle Birmingham, MD    We recommend  signing up for the patient portal called MyChart.  Sign up information is provided on this After Visit Summary.  MyChart is used to connect with patients for Virtual Visits (Telemedicine).  Patients are able to view lab/test results, encounter notes, upcoming appointments, etc.  Non-urgent messages can be sent to your provider as well.   To learn more about what you can do with MyChart, go to [email or web address].   Other Instructions Thank you for choosing [facility]!     [name] A DEPT OF [facility] [address] [place] [address] Dept: [phone] Loc: [phone]  Melinda Acevedo  08/29/2024  You are scheduled for a Cardiac Catheterization on Tuesday, September 16 with Dr. Newman Acevedo.  1. Please arrive at the [facility] (Main Entrance A) at [facility]: [address] [place], [place] [address] at 5:30 AM (This time is 2 hour(s) before your procedure to ensure your preparation).   Free valet parking service is available. You will check in at ADMITTING. The support person will be asked to wait in the waiting room.  It is OK to have someone drop you off and come back when you are ready to be discharged.    Special note: Every effort is made to have your procedure done on time. Please understand that emergencies sometimes delay scheduled procedures.  2. Diet: Nothing to eat after midnight.   3. Hydration: You need to be well hydrated before your procedure. On September 16, you may drink approved liquids (see below) until 2 hours before  the procedure, with 16 oz of water  as your last intake.   List of approved liquids water , clear juice, clear tea, black coffee, fruit juices, non-citric and without pulp, carbonated beverages, Gatorade, Kool -Aid, plain Jello-O and plain ice popsicles.  4. Labs: You will need to have blood drawn on Wednesday, September 16 at [facility] [address], [place]  Open: 7am - 6pm, Sat 8am - 12 noon   Phone: [phone]. You do not need to be fasting.  5. Medication instructions in preparation for your procedure:   Contrast Allergy: Yes, Please take Prednisone  50mg  by mouth at: Thirteen hours prior to cath 6:00pm on Monday Seven hours prior to cath 1:00am on Tuesday And prior to leaving home please take last dose of Prednisone  50mg  and Benadryl  50mg  by mouth. Thank you for choosing [facility]!    On the morning of your procedure, take your Aspirin  81 mg and any morning medicines NOT listed above.  You may use sips of water .  6. Plan to go home the same day, you will only stay overnight if medically necessary. 7. Bring a current list of your medications and current insurance cards. 8. You MUST have a responsible person to drive you home. 9. Someone MUST be with you the first 24 hours after you arrive home or your discharge will be delayed. 10. Please wear clothes that are easy to get on and off and wear slip-on shoes.  Thank you for allowing us  to care for you!   -- [facility]

## 2024-08-30 ENCOUNTER — Telehealth: Payer: Self-pay | Admitting: Internal Medicine

## 2024-08-30 NOTE — Telephone Encounter (Signed)
 Left message to return call with dates she would like to re-schedule her heart cath

## 2024-08-30 NOTE — Telephone Encounter (Signed)
 Pt is calling to reschedule heart cath and angiography. Please advise.

## 2024-08-30 NOTE — Telephone Encounter (Incomplete Revision)
 Pt is calling to reschedule heart cath and angiography. Please advise.   Pt called back to say she would like to schedule later in the morning. She wants to know if 7:30 works. She would not like to be there any earlier than 7:30 if possible, but if not, keep current appt.

## 2024-08-30 NOTE — Telephone Encounter (Signed)
 Spoke with Melinda Acevedo in cath lab and they do not have a later open slot available on Tuesday at this time.   Pt notified and is ok with current day and time.

## 2024-09-03 ENCOUNTER — Telehealth: Payer: Self-pay | Admitting: *Deleted

## 2024-09-03 NOTE — Telephone Encounter (Signed)
 Patient told me that there was not a specific stent that she was allergic to, but it was recommended to her in the past not to have drug coated stent because of her numerous allergies.

## 2024-09-03 NOTE — Telephone Encounter (Signed)
 Patient tells me that she had a bare metal stent placed in the LAD ~ 15 years ago in [place]-she will try to find her stent card.  Patient also tells me that she had been advised in the past that if she needed a stent placed in the future it should not be a drug coated stent since she is allergic to so many medications.

## 2024-09-03 NOTE — Telephone Encounter (Signed)
 True allergic reaction to medicated stents is very rare. We need to know why she was told she cannot have medicated stetns. If she needs a stent, it will be medicated. We do not use bare metal stents anymore.  Thanks MJP

## 2024-09-03 NOTE — Telephone Encounter (Signed)
 I reviewed current allergies listed with patient and patient tells me there are not additional allergies that she is aware of.

## 2024-09-03 NOTE — Telephone Encounter (Signed)
 Cardiac Catheterization scheduled at [facility] for: Tuesday September 04, 2024 7:30 AM Arrival time [facility] Main Entrance A at: 5:30 AM  Diet: -Nothing to eat after midnight.  -May have light meal until 6 AM. (6 hours before procedure time) Approved light meal consists of plain toast, fruit, light soups, crackers.  Nothing to eat or drink after midnight.  Hydration: -May drink clear liquids until 2 hours before the procedure.  Approved liquids: Water , clear tea, black coffee, fruit juices-non-citric and without pulp,Gatorade, plain Jello/popsicles.   -Please drink 16 oz of water  2 hours before procedure.  CONTRAST ALLERGY: 13 hour Prednisone  and Benadryl  Prep: 09/03/24 Prednisone  50 mg 6:30 PM 09/04/24 Prednisone  50 mg 12:30 AM 09/04/24 Prednisone  50 mg and Benadryl  50 mg just prior to arriving at hospital AM of procedure. Patient advised not to drive after taking Benadryl .  Medication instructions: -Usual morning medications can be taken including aspirin  81 mg.  Plan to go home the same day, you will only stay overnight if medically necessary.  You must have responsible adult to drive you home.  Someone must be with you the first 24 hours after you arrive home.  Reviewed procedure instructions with patient.

## 2024-09-03 NOTE — Telephone Encounter (Signed)
 If there is no known specific allergy to medication on the stent, I do not think there is any contraindication for a medicated stent or balloon, and should be safe to proceed with it, if needed.  Thanks MJP

## 2024-09-03 NOTE — Telephone Encounter (Signed)
 Need to find out what medication she is allergic to.  Thanks MJP

## 2024-09-04 ENCOUNTER — Ambulatory Visit (HOSPITAL_COMMUNITY)
Admission: RE | Admit: 2024-09-04 | Discharge: 2024-09-04 | Disposition: A | Discharge status: Home / Self Care | Attending: Cardiology | Admitting: Cardiology

## 2024-09-04 ENCOUNTER — Other Ambulatory Visit: Payer: Self-pay

## 2024-09-04 ENCOUNTER — Ambulatory Visit (HOSPITAL_COMMUNITY)
Admission: RE | Disposition: A | Discharge status: Home / Self Care | Payer: Self-pay | Source: Home / Self Care | Attending: Cardiology

## 2024-09-04 DIAGNOSIS — Z955 Presence of coronary angioplasty implant and graft: Secondary | ICD-10-CM | POA: Diagnosis not present

## 2024-09-04 DIAGNOSIS — I2510 Atherosclerotic heart disease of native coronary artery without angina pectoris: Secondary | ICD-10-CM | POA: Diagnosis present

## 2024-09-04 DIAGNOSIS — I2584 Coronary atherosclerosis due to calcified coronary lesion: Secondary | ICD-10-CM | POA: Insufficient documentation

## 2024-09-04 DIAGNOSIS — E785 Hyperlipidemia, unspecified: Secondary | ICD-10-CM | POA: Insufficient documentation

## 2024-09-04 DIAGNOSIS — I10 Essential (primary) hypertension: Secondary | ICD-10-CM | POA: Diagnosis not present

## 2024-09-04 DIAGNOSIS — I6529 Occlusion and stenosis of unspecified carotid artery: Secondary | ICD-10-CM | POA: Diagnosis not present

## 2024-09-04 DIAGNOSIS — M549 Dorsalgia, unspecified: Secondary | ICD-10-CM | POA: Diagnosis not present

## 2024-09-04 DIAGNOSIS — Z87891 Personal history of nicotine dependence: Secondary | ICD-10-CM | POA: Diagnosis not present

## 2024-09-04 DIAGNOSIS — E119 Type 2 diabetes mellitus without complications: Secondary | ICD-10-CM | POA: Diagnosis not present

## 2024-09-04 DIAGNOSIS — I493 Ventricular premature depolarization: Secondary | ICD-10-CM | POA: Diagnosis not present

## 2024-09-04 DIAGNOSIS — I25118 Atherosclerotic heart disease of native coronary artery with other forms of angina pectoris: Secondary | ICD-10-CM | POA: Insufficient documentation

## 2024-09-04 DIAGNOSIS — Z79899 Other long term (current) drug therapy: Secondary | ICD-10-CM | POA: Diagnosis not present

## 2024-09-04 DIAGNOSIS — R002 Palpitations: Secondary | ICD-10-CM | POA: Diagnosis not present

## 2024-09-04 HISTORY — PX: CORONARY ULTRASOUND/IVUS: CATH118244

## 2024-09-04 HISTORY — PX: CORONARY PRESSURE/FFR WITH 3D MAPPING: CATH118309

## 2024-09-04 HISTORY — PX: LEFT HEART CATH AND CORONARY ANGIOGRAPHY: CATH118249

## 2024-09-04 LAB — POCT ACTIVATED CLOTTING TIME: Activated Clotting Time: 262 s

## 2024-09-04 SURGERY — LEFT HEART CATH AND CORONARY ANGIOGRAPHY
Anesthesia: LOCAL

## 2024-09-04 MED ORDER — FENTANYL CITRATE (PF) 100 MCG/2ML IJ SOLN
INTRAMUSCULAR | Status: AC
  Filled 2024-09-04: qty 2

## 2024-09-04 MED ORDER — FAMOTIDINE IN NACL 20-0.9 MG/50ML-% IV SOLN
INTRAVENOUS | Status: DC | PRN
  Administered 2024-09-04: 20 mg via INTRAVENOUS

## 2024-09-04 MED ORDER — NITROGLYCERIN 1 MG/10 ML FOR IR/CATH LAB
INTRA_ARTERIAL | Status: AC
  Filled 2024-09-04: qty 10

## 2024-09-04 MED ORDER — HEPARIN (PORCINE) IN NACL 1000-0.9 UT/500ML-% IV SOLN
INTRAVENOUS | Status: DC | PRN
  Administered 2024-09-04 (×2): 500 mL

## 2024-09-04 MED ORDER — IOHEXOL 350 MG/ML SOLN
INTRAVENOUS | Status: DC | PRN
  Administered 2024-09-04: 100 mL

## 2024-09-04 MED ORDER — MIDAZOLAM HCL 2 MG/2ML IJ SOLN
INTRAMUSCULAR | Status: DC | PRN
  Administered 2024-09-04: 1 mg via INTRAVENOUS

## 2024-09-04 MED ORDER — VERAPAMIL HCL 2.5 MG/ML IV SOLN
INTRAVENOUS | Status: AC
  Filled 2024-09-04: qty 2

## 2024-09-04 MED ORDER — CLOPIDOGREL BISULFATE 300 MG PO TABS: ORAL_TABLET | ORAL | Status: DC | PRN

## 2024-09-04 MED ORDER — FENTANYL CITRATE (PF) 100 MCG/2ML IJ SOLN
INTRAMUSCULAR | Status: DC | PRN
  Administered 2024-09-04 (×2): 25 ug via INTRAVENOUS

## 2024-09-04 MED ORDER — ACETAMINOPHEN 325 MG PO TABS: 650.0000 mg | ORAL_TABLET | ORAL | Status: DC | PRN

## 2024-09-04 MED ORDER — HYDRALAZINE HCL 20 MG/ML IJ SOLN: 10.0000 mg | INTRAMUSCULAR | Status: DC | PRN

## 2024-09-04 MED ORDER — FREE WATER: 500.0000 mL | Freq: Once | Status: DC

## 2024-09-04 MED ORDER — CLOPIDOGREL BISULFATE 300 MG PO TABS
ORAL_TABLET | ORAL | Status: AC
  Filled 2024-09-04: qty 1

## 2024-09-04 MED ORDER — SODIUM CHLORIDE 0.9% FLUSH: 3.0000 mL | INTRAVENOUS | Status: DC | PRN

## 2024-09-04 MED ORDER — FAMOTIDINE IN NACL 20-0.9 MG/50ML-% IV SOLN
INTRAVENOUS | Status: AC
  Filled 2024-09-04: qty 50

## 2024-09-04 MED ORDER — SODIUM CHLORIDE 0.9% FLUSH: 3.0000 mL | Freq: Two times a day (BID) | INTRAVENOUS | Status: DC

## 2024-09-04 MED ORDER — ASPIRIN 81 MG PO CHEW: 81.0000 mg | CHEWABLE_TABLET | ORAL | Status: DC

## 2024-09-04 MED ORDER — LIDOCAINE HCL (PF) 1 % IJ SOLN
INTRAMUSCULAR | Status: AC
  Filled 2024-09-04: qty 30

## 2024-09-04 MED ORDER — LABETALOL HCL 5 MG/ML IV SOLN: 10.0000 mg | INTRAVENOUS | Status: DC | PRN

## 2024-09-04 MED ORDER — LIDOCAINE HCL (PF) 1 % IJ SOLN
INTRAMUSCULAR | Status: DC | PRN
  Administered 2024-09-04: 2 mL via INTRADERMAL

## 2024-09-04 MED ORDER — ONDANSETRON HCL 4 MG/2ML IJ SOLN: 4.0000 mg | Freq: Four times a day (QID) | INTRAMUSCULAR | Status: DC | PRN

## 2024-09-04 MED ORDER — MIDAZOLAM HCL 2 MG/2ML IJ SOLN
INTRAMUSCULAR | Status: AC
  Filled 2024-09-04: qty 2

## 2024-09-04 MED ORDER — HEPARIN SODIUM (PORCINE) 1000 UNIT/ML IJ SOLN
INTRAMUSCULAR | Status: AC
  Filled 2024-09-04: qty 10

## 2024-09-04 MED ORDER — SODIUM CHLORIDE 0.9 % IV SOLN: 250.0000 mL | INTRAVENOUS | Status: DC | PRN

## 2024-09-04 MED ORDER — HEPARIN SODIUM (PORCINE) 1000 UNIT/ML IJ SOLN
INTRAMUSCULAR | Status: DC | PRN
  Administered 2024-09-04: 3000 [IU] via INTRAVENOUS
  Administered 2024-09-04 (×2): 3500 [IU] via INTRAVENOUS

## 2024-09-04 MED ORDER — NITROGLYCERIN 1 MG/10 ML FOR IR/CATH LAB
INTRA_ARTERIAL | Status: DC | PRN
  Administered 2024-09-04: 200 ug via INTRA_ARTERIAL

## 2024-09-04 SURGICAL SUPPLY — 15 items
BALLOON EMERGE MR 3.0X12 (BALLOONS) IMPLANT
CATH INFINITI AMBI 5FR TG (CATHETERS) IMPLANT
CATH LAUNCHER 6FR EBU 3 (CATHETERS) IMPLANT
CATH OPTICROSS HD (CATHETERS) IMPLANT
DRAPE IVUS SLED (BAG) IMPLANT
GLIDESHEATH SLEND A-KIT 6F 22G (SHEATH) IMPLANT
GLIDESHEATH SLEND SS 6F .021 (SHEATH) IMPLANT
GUIDEWIRE INQWIRE 1.5J.035X260 (WIRE) IMPLANT
GUIDEWIRE PRESSURE X 175 (WIRE) IMPLANT
KIT ENCORE 26 ADVANTAGE (KITS) IMPLANT
KIT HEMO VALVE WATCHDOG (MISCELLANEOUS) IMPLANT
PACK CARDIAC CATHETERIZATION (CUSTOM PROCEDURE TRAY) ×1 IMPLANT
SET ATX-X65L (MISCELLANEOUS) IMPLANT
SHEATH PROBE COVER 6X72 (BAG) IMPLANT
WIRE RUNTHROUGH .014X180CM (WIRE) IMPLANT

## 2024-09-04 NOTE — Progress Notes (Signed)
 Patient given discharge instructions, reviewed with patient. Patient states she understands all she was educated on and has no further questions at this time.

## 2024-09-04 NOTE — H&P (Signed)
 OV 08/29/2024 copied for documentation       HPI Melinda Acevedo returns for ongoing evaluation of PVC's and NS AT. She is pleasant 83 yo woman, formely seen in [place] who has a h/o CAD with LAD stenting remotely. She notes that when she had her PCI 15 years ago she never had chest pain and had a very tight LAD. She has not had syncope.  Her main complaints is worsening dyspnea and fatigue. She thinks that her symptoms are just like she had 15 years ago before she had her PCI. She has never had a heart cath since her PCI.  Allergies  Allergen Reactions   Alendronate     Made bones hurt   Cinoxacin Hives   Contrast Media [Iodinated Contrast Media] Diarrhea    Only kidney ID dyes   Diltiazem     made her feel like she was in a cloud   Keflex [Cephalexin] Hives   Naproxen Other (See Comments)    Stomach upset   Nifedipine Other (See Comments)    Unknown reaction   Norfloxacin Hives   Poison Ivy Extract    Pravastatin Sodium Other (See Comments)    Stomach cramps   Trimethoprim Hives   Valdecoxib Other (See Comments)    Lower belly pain     Current Facility-Administered Medications  Medication Dose Route Frequency Provider Last Rate Last Admin   0.9 %  sodium chloride  infusion  250 mL Intravenous PRN Waddell Danelle ORN, MD       aspirin  chewable tablet 81 mg  81 mg Oral Charyl Waddell Danelle ORN, MD       free water  500 mL  500 mL Oral Once Waddell Danelle ORN, MD       Heparin  (Porcine) in NaCl 1000-0.9 UT/500ML-% SOLN    PRN Perris Conwell J, MD   500 mL at 09/04/24 0743   sodium chloride  flush (NS) 0.9 % injection 3 mL  3 mL Intravenous Q12H Waddell Danelle ORN, MD       sodium chloride  flush (NS) 0.9 % injection 3 mL  3 mL Intravenous PRN Waddell Danelle ORN, MD         Past Medical History:  Diagnosis Date   Anxiety    CAD (coronary artery disease)    a. s/p stenting to LAD in 2010 per patient's report b. no ischemia by NST in 09/2020   Carotid artery occlusion    Diabetes  mellitus without complication (HCC)    Glaucoma    History of hiatal hernia    Hyperlipidemia    Hypertension    Irregular heart beats    Seasonal allergies    Sleep apnea     ROS:   All systems reviewed and negative except as noted in the HPI.   Past Surgical History:  Procedure Laterality Date   ABDOMINAL HYSTERECTOMY     CATARACT EXTRACTION Bilateral    CHOLECYSTECTOMY     COLONOSCOPY  2017   Dr.Williams in [place] , [place]   COLONOSCOPY WITH PROPOFOL  N/A 02/06/2021   Procedure: COLONOSCOPY WITH PROPOFOL ;  Surgeon: Eartha Angelia Sieving, MD;  [facility];  Service: Gastroenterology;  Laterality: N/A;  10:30   COLONOSCOPY WITH PROPOFOL  N/A 09/29/2021   Procedure: COLONOSCOPY WITH PROPOFOL ;  Surgeon: Eartha Angelia Sieving, MD;  [facility];  Service: Gastroenterology;  Laterality: N/A;  10:05   CORONARY ANGIOPLASTY     stent   ESOPHAGOGASTRODUODENOSCOPY (EGD) WITH PROPOFOL  N/A 02/06/2021   Procedure: ESOPHAGOGASTRODUODENOSCOPY (  EGD) WITH PROPOFOL ;  Surgeon: Eartha Flavors, Toribio, MD;  [facility];  Service: Gastroenterology;  Laterality: N/A;   ESOPHAGOGASTRODUODENOSCOPY (EGD) WITH PROPOFOL  N/A 04/17/2021   Procedure: ESOPHAGOGASTRODUODENOSCOPY (EGD) WITH PROPOFOL ;  Surgeon: Mavis Anes, MD;  Location: [facility];  Service: General;  Laterality: N/A;   ESOPHAGOGASTRODUODENOSCOPY (EGD) WITH PROPOFOL  N/A 05/14/2021   Procedure: ESOPHAGOGASTRODUODENOSCOPY (EGD) WITH PROPOFOL ;  Surgeon: Mavis Anes, MD;  Location: [facility];  Service: General;  Laterality: N/A;   heart catherization  8010,8000, 2010   IR GASTROSTOMY TUBE REMOVAL  05/13/2021   PEG PLACEMENT N/A 04/17/2021   Procedure: PERCUTANEOUS ENDOSCOPIC GASTROSTOMY (PEG) PLACEMENT;  Surgeon: Mavis Anes, MD;  Location: [facility];  Service: General;  Laterality: N/A;   PEG PLACEMENT N/A 05/14/2021   Procedure: PERCUTANEOUS ENDOSCOPIC GASTROSTOMY (PEG) PLACEMENT;  Surgeon: Mavis Anes,  MD;  Location: [facility];  Service: General;  Laterality: N/A;   PEG PLACEMENT N/A 11/10/2022   Procedure: PERCUTANEOUS GASTROSTOMY (PEG) PLACEMENT WITHOUT ENDOSCOPY - REPLACEMENT;  Surgeon: Mavis Anes, MD;  [facility];  Service: Gastroenterology;  Laterality: N/A;   POLYPECTOMY  09/29/2021   Procedure: POLYPECTOMY;  Surgeon: Eartha Flavors, Toribio, MD;  Location[facility];  Service: Gastroenterology;;   RECTOCELE REPAIR     TONSILLECTOMY     TUBAL LIGATION  1986   UPPER GASTROINTESTINAL ENDOSCOPY  2015   Dr.Williams in [place], [place]   VAGINAL HYSTERECTOMY  1991     Family History  Problem Relation Age of Onset   COPD Mother    Heart disease Mother    Heart disease Father    Heart attack Father    Hypertension Sister    Mitral valve prolapse Brother      Social History   Socioeconomic History   Marital status: Married    Spouse name: Not on file   Number of children: Not on file   Years of education: Not on file   Highest education level: Not on file  Occupational History   Not on file  Tobacco Use   Smoking status: Former    Current packs/day: 0.00    Types: Cigarettes    Start date: 60    Quit date: 1964    Years since quitting: 61.7   Smokeless tobacco: Never  Vaping Use   Vaping status: Never Used  Substance and Sexual Activity   Alcohol use: No   Drug use: No   Sexual activity: Not on file  Other Topics Concern   Not on file  Social History Narrative   Not on file   Social Drivers of Health   Financial Resource Strain: Not on file  Food Insecurity: Not on file  Transportation Needs: Not on file  Physical Activity: Not on file  Stress: Not on file  Social Connections: Not on file  Intimate Partner Violence: Not on file     BP (!) 164/68   Pulse 95   Temp 98.1 F (36.7 C) (Oral)   Resp 16   Ht 5' (1.524 m)   Wt 70.8 kg   SpO2 98%   BMI 30.47 kg/m   Physical Exam:  Well appearing NAD HEENT: Unremarkable Neck:   No JVD, no thyromegally Lymphatics:  No adenopathy Back:  No CVA tenderness Lungs:  Clear with no wheezes HEART:  Regular rate rhythm, no murmurs, no rubs, no clicks Abd:  soft, positive bowel sounds, no organomegally, no rebound, no guarding Ext:  2 plus pulses, no edema, no cyanosis, no clubbing Skin:  No rashes no nodules Neuro:  CN II through XII intact, motor grossly intact  EKG - nsr  Assess/Plan:  Palpitations - she was intolerant of low dose metoprolol . With her h/o PCI stenting, not a candidate for flecainide. I might try Mexitil if her symptoms of PVC's worsen. Dyspnea and fatigue - this could be her anginal equivalent. Her EF was ok by echo. I have asked her to undergo left heart cath as her symptoms are just like she had prior to her PCI.    Danelle Taylor,MD

## 2024-09-04 NOTE — Discharge Instructions (Signed)

## 2024-09-04 NOTE — H&P (View-Only) (Signed)
 OV 08/29/2024 copied for documentation       HPI Melinda Acevedo returns for ongoing evaluation of PVC's and NS AT. She is pleasant 83 yo woman, formely seen in [place] who has a h/o CAD with LAD stenting remotely. She notes that when she had her PCI 15 years ago she never had chest pain and had a very tight LAD. She has not had syncope.  Her main complaints is worsening dyspnea and fatigue. She thinks that her symptoms are just like she had 15 years ago before she had her PCI. She has never had a heart cath since her PCI.  Allergies  Allergen Reactions   Alendronate     Made bones hurt   Cinoxacin Hives   Contrast Media [Iodinated Contrast Media] Diarrhea    Only kidney ID dyes   Diltiazem     made her feel like she was in a cloud   Keflex [Cephalexin] Hives   Naproxen Other (See Comments)    Stomach upset   Nifedipine Other (See Comments)    Unknown reaction   Norfloxacin Hives   Poison Ivy Extract    Pravastatin Sodium Other (See Comments)    Stomach cramps   Trimethoprim Hives   Valdecoxib Other (See Comments)    Lower belly pain     Current Facility-Administered Medications  Medication Dose Route Frequency Provider Last Rate Last Admin   0.9 %  sodium chloride  infusion  250 mL Intravenous PRN Waddell Danelle ORN, MD       aspirin  chewable tablet 81 mg  81 mg Oral Charyl Waddell Danelle ORN, MD       free water  500 mL  500 mL Oral Once Waddell Danelle ORN, MD       Heparin  (Porcine) in NaCl 1000-0.9 UT/500ML-% SOLN    PRN Perris Conwell J, MD   500 mL at 09/04/24 0743   sodium chloride  flush (NS) 0.9 % injection 3 mL  3 mL Intravenous Q12H Waddell Danelle ORN, MD       sodium chloride  flush (NS) 0.9 % injection 3 mL  3 mL Intravenous PRN Waddell Danelle ORN, MD         Past Medical History:  Diagnosis Date   Anxiety    CAD (coronary artery disease)    a. s/p stenting to LAD in 2010 per patient's report b. no ischemia by NST in 09/2020   Carotid artery occlusion    Diabetes  mellitus without complication (HCC)    Glaucoma    History of hiatal hernia    Hyperlipidemia    Hypertension    Irregular heart beats    Seasonal allergies    Sleep apnea     ROS:   All systems reviewed and negative except as noted in the HPI.   Past Surgical History:  Procedure Laterality Date   ABDOMINAL HYSTERECTOMY     CATARACT EXTRACTION Bilateral    CHOLECYSTECTOMY     COLONOSCOPY  2017   Dr.Williams in [place] , [place]   COLONOSCOPY WITH PROPOFOL  N/A 02/06/2021   Procedure: COLONOSCOPY WITH PROPOFOL ;  Surgeon: Eartha Angelia Sieving, MD;  [facility];  Service: Gastroenterology;  Laterality: N/A;  10:30   COLONOSCOPY WITH PROPOFOL  N/A 09/29/2021   Procedure: COLONOSCOPY WITH PROPOFOL ;  Surgeon: Eartha Angelia Sieving, MD;  Location: [facility];  Service: Gastroenterology;  Laterality: N/A;  10:05   CORONARY ANGIOPLASTY     stent   ESOPHAGOGASTRODUODENOSCOPY (EGD) WITH PROPOFOL  N/A 02/06/2021   Procedure: ESOPHAGOGASTRODUODENOSCOPY (  EGD) WITH PROPOFOL ;  Surgeon: Eartha Flavors, Toribio, MD;  [facility];  Service: Gastroenterology;  Laterality: N/A;   ESOPHAGOGASTRODUODENOSCOPY (EGD) WITH PROPOFOL  N/A 04/17/2021   Procedure: ESOPHAGOGASTRODUODENOSCOPY (EGD) WITH PROPOFOL ;  Surgeon: Mavis Anes, MD;  Location: [facility];  Service: General;  Laterality: N/A;   ESOPHAGOGASTRODUODENOSCOPY (EGD) WITH PROPOFOL  N/A 05/14/2021   Procedure: ESOPHAGOGASTRODUODENOSCOPY (EGD) WITH PROPOFOL ;  Surgeon: Mavis Anes, MD;  Location: [facility];  Service: General;  Laterality: N/A;   heart catherization  8010,8000, 2010   IR GASTROSTOMY TUBE REMOVAL  05/13/2021   PEG PLACEMENT N/A 04/17/2021   Procedure: PERCUTANEOUS ENDOSCOPIC GASTROSTOMY (PEG) PLACEMENT;  Surgeon: Mavis Anes, MD;  Location: [facility];  Service: General;  Laterality: N/A;   PEG PLACEMENT N/A 05/14/2021   Procedure: PERCUTANEOUS ENDOSCOPIC GASTROSTOMY (PEG) PLACEMENT;  Surgeon: Mavis Anes,  MD;  Location: [facility];  Service: General;  Laterality: N/A;   PEG PLACEMENT N/A 11/10/2022   Procedure: PERCUTANEOUS GASTROSTOMY (PEG) PLACEMENT WITHOUT ENDOSCOPY - REPLACEMENT;  Surgeon: Mavis Anes, MD;  [facility];  Service: Gastroenterology;  Laterality: N/A;   POLYPECTOMY  09/29/2021   Procedure: POLYPECTOMY;  Surgeon: Eartha Flavors, Toribio, MD;  [facility];  Service: Gastroenterology;;   RECTOCELE REPAIR     TONSILLECTOMY     TUBAL LIGATION  1986   UPPER GASTROINTESTINAL ENDOSCOPY  2015   Dr.Williams in [place], [place]   VAGINAL HYSTERECTOMY  1991     Family History  Problem Relation Age of Onset   COPD Mother    Heart disease Mother    Heart disease Father    Heart attack Father    Hypertension Sister    Mitral valve prolapse Brother      Social History   Socioeconomic History   Marital status: Married    Spouse name: Not on file   Number of children: Not on file   Years of education: Not on file   Highest education level: Not on file  Occupational History   Not on file  Tobacco Use   Smoking status: Former    Current packs/day: 0.00    Types: Cigarettes    Start date: 60    Quit date: 1964    Years since quitting: 61.7   Smokeless tobacco: Never  Vaping Use   Vaping status: Never Used  Substance and Sexual Activity   Alcohol use: No   Drug use: No   Sexual activity: Not on file  Other Topics Concern   Not on file  Social History Narrative   Not on file   Social Drivers of Health   Financial Resource Strain: Not on file  Food Insecurity: Not on file  Transportation Needs: Not on file  Physical Activity: Not on file  Stress: Not on file  Social Connections: Not on file  Intimate Partner Violence: Not on file     BP (!) 164/68   Pulse 95   Temp 98.1 F (36.7 C) (Oral)   Resp 16   Ht 5' (1.524 m)   Wt 70.8 kg   SpO2 98%   BMI 30.47 kg/m   Physical Exam:  Well appearing NAD HEENT: Unremarkable Neck:   No JVD, no thyromegally Lymphatics:  No adenopathy Back:  No CVA tenderness Lungs:  Clear with no wheezes HEART:  Regular rate rhythm, no murmurs, no rubs, no clicks Abd:  soft, positive bowel sounds, no organomegally, no rebound, no guarding Ext:  2 plus pulses, no edema, no cyanosis, no clubbing Skin:  No rashes no nodules Neuro:  CN II through XII intact, motor grossly intact  EKG - nsr  Assess/Plan:  Palpitations - she was intolerant of low dose metoprolol . With her h/o PCI stenting, not a candidate for flecainide. I might try Mexitil if her symptoms of PVC's worsen. Dyspnea and fatigue - this could be her anginal equivalent. Her EF was ok by echo. I have asked her to undergo left heart cath as her symptoms are just like she had prior to her PCI.    Danelle Taylor,MD

## 2024-09-04 NOTE — Interval H&P Note (Signed)
 History and Physical Interval Note:  09/04/2024 7:49 AM  Kenneth Daring  has presented today for surgery, with the diagnosis of cad.  The various methods of treatment have been discussed with the patient and family. After consideration of risks, benefits and other options for treatment, the patient has consented to  Procedure(s): LEFT HEART CATH AND CORONARY ANGIOGRAPHY (N/A) as a surgical intervention.  The patient's history has been reviewed, patient examined, no change in status, stable for surgery.  I have reviewed the patient's chart and labs.  Questions were answered to the patient's satisfaction.     Raeana Blinn J Urias Sheek

## 2024-09-05 ENCOUNTER — Encounter (HOSPITAL_COMMUNITY): Payer: Self-pay | Admitting: Cardiology

## 2024-09-05 MED FILL — Verapamil HCl IV Soln 2.5 MG/ML: INTRAVENOUS | Qty: 2 | Status: AC

## 2024-09-05 MED FILL — Clopidogrel Bisulfate Tab 300 MG (Base Equiv): ORAL | Qty: 2 | Status: AC

## 2024-09-07 ENCOUNTER — Other Ambulatory Visit (HOSPITAL_COMMUNITY): Payer: Self-pay

## 2024-09-07 ENCOUNTER — Ambulatory Visit: Payer: Self-pay | Admitting: Emergency Medicine

## 2024-09-07 ENCOUNTER — Telehealth: Payer: Self-pay

## 2024-09-07 DIAGNOSIS — I25118 Atherosclerotic heart disease of native coronary artery with other forms of angina pectoris: Secondary | ICD-10-CM

## 2024-09-07 MED ORDER — CLOPIDOGREL BISULFATE 75 MG PO TABS: ORAL_TABLET | ORAL | 3 refills | Status: DC

## 2024-09-07 MED ORDER — PREDNISONE 50 MG PO TABS
ORAL_TABLET | ORAL | 0 refills | Status: DC
Start: 2024-09-07 — End: 2024-10-02

## 2024-09-07 MED ORDER — PANTOPRAZOLE SODIUM 20 MG PO TBEC: 20.0000 mg | DELAYED_RELEASE_TABLET | Freq: Every day | ORAL | 1 refills | Status: DC | PRN

## 2024-09-07 MED ORDER — PANTOPRAZOLE SODIUM 20 MG PO TBEC: 20.0000 mg | DELAYED_RELEASE_TABLET | Freq: Every day | ORAL | 2 refills | Status: DC

## 2024-09-07 MED ORDER — PREDNISONE 50 MG PO TABS
ORAL_TABLET | ORAL | 0 refills | Status: DC
  Filled 2024-09-07: qty 3, 1d supply, fill #0

## 2024-09-07 MED ORDER — ASPIRIN 81 MG PO TBEC: 81.0000 mg | DELAYED_RELEASE_TABLET | Freq: Every day | ORAL | 3 refills | Status: AC

## 2024-09-07 MED ORDER — CLOPIDOGREL BISULFATE 75 MG PO TABS
ORAL_TABLET | ORAL | 3 refills | Status: DC
  Filled 2024-09-07: qty 97, 90d supply, fill #0

## 2024-09-07 NOTE — Telephone Encounter (Signed)
 Spoke to patient and advised PCI being scheduled in 09/19/2024. Reviewed instructions and sent via MyChart to the patient and verbally reviewed. Per Dr. Gilda note and verbal instruction, sent in prescription for Plavix  to pt's pharmacy, d/c Aciphex, started pantoprazole  daily, and started aspirin  81 due to verifying that patient is not currently taking it. Reviewed plan for Prednisone  dose before PCI and script sent in. Pt reports that she will take her over the counter dose of Benadryl  as instructed before PCI.   Message sent to Pre-cert and Jenkins Lipps and Delon Decent for review.

## 2024-09-07 NOTE — Heart Team MDD (Signed)
   Heart Team Multi-Disciplinary Discussion  Patient: Melinda Acevedo  DOB: 11-Jul-1941  MRN: [id number]   Date: 09/07/2024  12:05 PM    Attendees: Interventional Cardiology: Ozell Fell, MD Gordy Bergamo, MD Alm Clay, MD Lurena Red, MD Newman Lawrence, MD Deatrice Cage, MD  Cardiothoracic Surgery: Linnie Rayas, MD   Additional Attendees: Con Clunes, MD   Patient History: 83 yo woman, formerly seen in [place] who has a h/o CAD with RCA stenting remotely. She has not had syncope. Patient main complaints of worsening dyspnea and fatigue and neck pain associated with walking. She thinks that her symptoms are just like she had 15 years ago before she had her PCI.   Risk Factors: Diabetes Mellitus Hypertension Hyperlipidemia   CAD History: Hx of bare metal stent to mid RCA in 2010.   Review of Prior Angiography and PCI Procedures: Left heart cath and coronary angiography from 09/04/2024 images were reviewed and discussed in detail including: LM with no significant disease. LAD: Mid 70% stenosis with moderate calcification with adjacent large bifurcating diagonal with ostial 80% stenosis of both branches. LAD RFR 0.78. LAD MLA 3.5 mm2 just after bifurcation with diag, and further downstream MLA 3.1 mm2. Lcx: Mid 40% disease. RCA: Medium caliber dominant vessel. Prox-mid focal 80% stenosis, just before prior BMS that has 0% late lumen loss. Severe multivessel disease. Patient had expressed her preference towards PCI over CABG at the beginning of the case.  Initial report given that the patient was the patient has bare mental LAD stent. However, on further review, her stent is in fact in mid RCA and is fairly patent. Given that her mid LAD disease is a native vessel, stenting mid LAD would have high risk of losing bifurcating large diagonal's that supply a large amount of myocardium.      Discussion: After presentation, consideration of treatment options occurred  including referral to CVTS for possible CABG versus PCI. Discussion surrounded age of patient and functional status. Additionally concerns of difficulty of the PCI due to the location of the lesions and possible need for bare metal stents were brought up. Consensus from the team after extensive discussion was for PCI to LAD and Diag.    Recommendations: PCI     Ellouise Elnor Myron, RN  09/07/2024 12:05 PM

## 2024-09-10 NOTE — Telephone Encounter (Signed)
 Spoke with the patient who states that since last Thursday she has been feeling dizzy, weak, fatigued, and nauseous. She states that today she is feeling better but this started after her recent heart cath. She states that she took prednisone  prior to the cath due to allergy to contrast dye. She feels like she had withdraws from the high doses of prednisone  and then abruptly stopping. She states that she would like to take something different prior to her upcoming stent placement. She states that her allergy to contrast dye was from over 50 years ago. Patient also notes she was started on Plavix  load of 600 mg for one dose and then 75 mg daily thereafter. Will send to Dr. Elmira and PharmD team for recommendations.

## 2024-09-10 NOTE — Telephone Encounter (Signed)
 We could consider one time IV hydrocortisone dose before the procedure instead of oral prednisone .   Thanks MJP

## 2024-09-10 NOTE — Telephone Encounter (Signed)
 Pt called in asking to speak with nurse about Plavix  rx that was sent in.

## 2024-09-11 ENCOUNTER — Telehealth: Payer: Self-pay | Admitting: Pharmacy Technician

## 2024-09-11 ENCOUNTER — Other Ambulatory Visit (HOSPITAL_COMMUNITY): Payer: Self-pay

## 2024-09-11 NOTE — Telephone Encounter (Signed)
    Pharmacy Patient Advocate Encounter   Received notification from CoverMyMeds that prior authorization for pantoprazole  is required/requested.   Insurance verification completed.   The patient is insured through [employer] [facility] .   Per test claim: PA required; PA submitted to above mentioned insurance via Latent Key/confirmation #/EOC [id number] Status is pending   Pharmacy Patient Advocate Encounter  Received notification from [employer] [facility] that Prior Authorization for pantoprazole   has been APPROVED from 09/11/24 to 09/11/25   PA #/Case ID/Reference #: [id number]

## 2024-09-11 NOTE — Telephone Encounter (Signed)
 Spoke to patient and she states that she would prefer to to do IV hydrocortisone. Does she still need to plan to do oral Benadryl  as planned? Pt reports that she has been in the ER all day due to having food poison. Pt states that they considered admitting patient due to PVC burden.   Canceled 09/19/24 appt with Dr. Lucas.

## 2024-09-12 NOTE — Telephone Encounter (Signed)
 I am sorry I just read this message.  Is Melinda Acevedo currently admitted to the hospital? I do no not see her admitted to [facility] facility.  Thanks MJP

## 2024-09-13 NOTE — Telephone Encounter (Signed)
 Which hospital did she go to? Please check back on her to make sure things are okay before we proceed with PCI plans next week.   Thanks MJP

## 2024-09-13 NOTE — Telephone Encounter (Signed)
 No they discussed keeping her, but decided to discharge her.

## 2024-09-13 NOTE — Telephone Encounter (Signed)
 Pt is still having fatigue, weakness, and nausea still but getting better. Pt went to [facility] in [place]. Pt reports that she has not start Plavix  yet since her stomach is upset. Are you okay with patient still taking oral Benadyrl as scheduled before procedure?   Pt did express that she is a little concerned about IV hydrocortisone and wanted to know if you could please call her and discuss this with her.

## 2024-09-14 ENCOUNTER — Encounter: Admitting: Thoracic Surgery (Cardiothoracic Vascular Surgery)

## 2024-09-14 LAB — BASIC METABOLIC PANEL WITH GFR
BUN/Creatinine Ratio: 26 (ref 12–28)
BUN: 17 mg/dL (ref 8–27)
CO2: 22 mmol/L (ref 20–29)
Calcium: 9.1 mg/dL (ref 8.7–10.3)
Chloride: 105 mmol/L (ref 96–106)
Creatinine, Ser: 0.65 mg/dL (ref 0.57–1.00)
Glucose: 119 mg/dL — AB (ref 70–99)
Potassium: 3.8 mmol/L (ref 3.5–5.2)
Sodium: 144 mmol/L (ref 134–144)
eGFR: 88 mL/min/1.73 (ref >59)

## 2024-09-14 LAB — CBC
Hematocrit: 37.8 % (ref 34.0–46.6)
Hemoglobin: 12.1 g/dL (ref 11.1–15.9)
MCH: 31.2 pg (ref 26.6–33.0)
MCHC: 32 g/dL (ref 31.5–35.7)
MCV: 97 fL (ref 79–97)
Platelets: 314 x10E3/uL (ref 150–450)
RBC: 3.88 x10E6/uL (ref 3.77–5.28)
RDW: 13.1 % (ref 11.7–15.4)
WBC: 8.2 x10E3/uL (ref 3.4–10.8)

## 2024-09-14 NOTE — Telephone Encounter (Signed)
 I spoke to the patient over the phone.  Her GI symptoms have improved.  She wants to take pantoprazole  twice daily, which is okay with me.  If nausea still does not improve, may need to consider going back to her Aciphex by Monday, 09/17/2024, which would mean considering switching from Plavix  to Brilinta as antiplatelet agent of choice.  Otherwise, we will continue management of contrast allergy preparation with IV hydrocortisone as per patient's choice as opposed to prednisone .  Patient will contact us  on Monday, 09/17/2024 to let us  know if she is tolerating Plavix .  Thanks MJP

## 2024-09-17 ENCOUNTER — Telehealth: Payer: Self-pay | Admitting: *Deleted

## 2024-09-17 NOTE — Telephone Encounter (Signed)
 Thank you. Good to hear that she is tolerating Plavix  well.  Thanks MJP

## 2024-09-17 NOTE — Telephone Encounter (Deleted)
 09/13/24 BMP/CBC results in [facility]

## 2024-09-17 NOTE — Telephone Encounter (Signed)
 Coronary Stent scheduled at [facility] for: Wednesday September 19, 2024 10 AM Arrival time [facility] Main Entrance A at: 8 AM  Diet: -Nothing to eat after midnight.  Hydration: -May drink clear liquids until 2 hours before the procedure.  Approved liquids: Water , clear tea, black coffee, fruit juices-non-citric and without pulp,Gatorade, plain Jello/popsicles.   -Please drink 16 oz of water  2 hours before procedure.  CONTRAST ALLERGY: yes -Per 09/14/24 Phone Note Dr Elmira: -we will continue management of contrast allergy preparation with IV hydrocortisone as per patient's choice as opposed to prednisone .  Patient tells me she will go ahead and take PO Benadryl  50 mg OTW to hospital AM of procedure.  Patient advised not to drive after taking Benadryl .  Medication instructions: -Usual morning medications can be taken including aspirin  81 mg and Plavix  75 mg   Pt tells me she will also take pantoprazole  AM of procedure-she feels she is tolerating Plavix  and pantoprazole  at this time.   Plan to go home the same day, you will only stay overnight if medically necessary.  You must have responsible adult to drive you home.  Someone must be with you the first 24 hours after you arrive home.  Reviewed procedure instructions with patient.

## 2024-09-17 NOTE — Telephone Encounter (Signed)
 Pt requesting a c/b from the nurse before her procedure.

## 2024-09-18 NOTE — Telephone Encounter (Signed)
 PCI rescheduled to to 10/02/2024. Pt agrees with plan of care. New instructions will be sent to the patient through MyChart.

## 2024-09-18 NOTE — Telephone Encounter (Signed)
 We typically do not like to insert the stent if there is active infection, especially in the bloodstream.  I understand that you may not have bloodstream infection, but if there are ongoing symptoms of UTI and infection, it may be best to reschedule the procedure until after the UTI is resolved.   Thanks MJP

## 2024-09-18 NOTE — Telephone Encounter (Signed)
 Pt states she might have a UTI and would like to know if that could effect her procedure.

## 2024-09-18 NOTE — Telephone Encounter (Signed)
 RN informed patient  that Dr Elmira would like to postpone  procedure until  infestion  is cleared up  Patient is aware.

## 2024-09-19 ENCOUNTER — Encounter: Admitting: Surgery

## 2024-09-19 ENCOUNTER — Telehealth: Payer: Self-pay | Admitting: Internal Medicine

## 2024-09-19 MED ORDER — PANTOPRAZOLE SODIUM 20 MG PO TBEC: 20.0000 mg | DELAYED_RELEASE_TABLET | Freq: Two times a day (BID) | ORAL | 3 refills | Status: AC

## 2024-09-19 NOTE — Telephone Encounter (Signed)
 Calling to speak to the nurse about having to r/s her appt for stint. Please advise

## 2024-09-19 NOTE — Telephone Encounter (Signed)
 Yes, okay with me. If no improvement, may need to consider seeing GI.  09/19/24

## 2024-09-19 NOTE — Telephone Encounter (Signed)
 Pt reports that she got the results back from PCP and she does not have a UTI, she has a yeast infection. So, she wanted to ask the provider if they still wanted to keep the stent placement for 10/14, or does he want to move it up.  Informed her that I will send this to her provider and we will let her know. She verbalized understanding.

## 2024-09-19 NOTE — Telephone Encounter (Signed)
 Spoke to patient she is taking 20 mg twice  a day . She states this has helped.  The last few days sh has been taking 1 tablet of pantoprazole   and 1 tablet Pepcid  Ac   RN informed patient new Rx has been sent to pharmacy - PA team will do a prior if needed.  Per Dr Elmira, if symptoms does not improve patient will need to see  GI   The PCP can refer as needed.  Patient verbalized understanding

## 2024-09-19 NOTE — Addendum Note (Signed)
 Addended by: GLADIS REENA GAILS on: 09/19/2024 06:16 PM   Modules accepted: Orders

## 2024-09-19 NOTE — Telephone Encounter (Signed)
 Waddell Danelle ORN, MD to Melinda Acevedo Triage (Selected Message)     09/19/24  4:23 PM Treat the yeast infection and only move up if the symptoms change and worsen and become emergent which I do not expect. GT  Gave the pt the information above. Instructed her to call us  if she develops any symptoms. She verbalized understanding and is in agreement with plan.

## 2024-09-20 ENCOUNTER — Other Ambulatory Visit (HOSPITAL_COMMUNITY): Payer: Self-pay

## 2024-09-20 ENCOUNTER — Telehealth: Payer: Self-pay | Admitting: Pharmacy Technician

## 2024-09-20 NOTE — Telephone Encounter (Signed)
    Per test claim pa not needed next fill 11/25/24 pharmacy filled

## 2024-09-20 NOTE — Telephone Encounter (Signed)
 Pharmacy filled. Pa not needed at the time

## 2024-09-27 ENCOUNTER — Telehealth: Payer: Self-pay | Admitting: Cardiology

## 2024-09-27 NOTE — Telephone Encounter (Signed)
 Patient called to follow-up on her upcoming procedure scheduled for 10/14 and next steps.

## 2024-09-27 NOTE — Telephone Encounter (Signed)
 Left message to call back.

## 2024-09-27 NOTE — Telephone Encounter (Signed)
 Patient is returning phone call.

## 2024-09-27 NOTE — Telephone Encounter (Signed)
 Spoke to patient and she reports that she is feeling better, but feeling tired. Pt reports that urine culture is pending and will keep in contact about results.

## 2024-09-28 NOTE — Telephone Encounter (Signed)
 Spoke to patient and she advised that urine culture resulted clear. Pt will plan to continue with cath and will monitor for symptoms.

## 2024-09-28 NOTE — Telephone Encounter (Signed)
 I think we can proceed with the cath unless any new fever or infectious symptoms.  Thanks MJP

## 2024-09-28 NOTE — Telephone Encounter (Signed)
 Dd she receive any antibiotics for UTI or UTI was felt not likely based on urinalysis?  Thanks MJP

## 2024-09-28 NOTE — Telephone Encounter (Signed)
 She did receive medications. A recheck of the urine culture should result today.

## 2024-10-01 ENCOUNTER — Telehealth: Payer: Self-pay | Admitting: *Deleted

## 2024-10-01 NOTE — Telephone Encounter (Signed)
 Coronary Stent Intervention scheduled at [facility] for: Tuesday October 02, 2024 9 AM Arrival time [facility] Main Entrance A at: 7 AM  Diet: -Nothing to eat after midnight.  Hydration: -May drink clear liquids until 2 hours before the procedure.  Approved liquids: Water , clear tea, black coffee, fruit juices-non-citric and without pulp,Gatorade, plain Jello/popsicles.   -Please drink 16  oz of water  2 hours before procedure.  CONTRAST ALLERGY: yes -Per 09/14/24 Phone Note Dr Elmira: -we will continue management of contrast allergy preparation with IV hydrocortisone as per patient's choice as opposed to prednisone .  Patient will take PO Benadryl  50 mg OTW to hospital AM of procedure.  Patient advised not to drive after taking Benadryl .  Medication instructions: -Usual morning medications can be taken including aspirin  81 mg and Plavix  75 mg.   Plan to go home the same day, you will only stay overnight if medically necessary.  You must have responsible adult to drive you home.  Someone must be with you the first 24 hours after you arrive home.  Reviewed procedure instructions with patient. Patient tells me she is tolerating pantoprazole .

## 2024-10-01 NOTE — Telephone Encounter (Addendum)
 Per Dr Cheril, RN Staff Message 10/01/24-will use Solumedrol and Benadryl  for contrast allergy prophylaxis. Pt will take oral Benadryl  50 mg OTW to the hospital and let Short Stay staff know when she arrives that she has taken it.

## 2024-10-02 ENCOUNTER — Encounter (HOSPITAL_COMMUNITY)
Admission: RE | Disposition: A | Discharge status: Home / Self Care | Payer: Self-pay | Source: Home / Self Care | Attending: Cardiology

## 2024-10-02 ENCOUNTER — Ambulatory Visit (HOSPITAL_COMMUNITY)
Admission: RE | Admit: 2024-10-02 | Discharge: 2024-10-03 | Disposition: A | Discharge status: Home / Self Care | Attending: Cardiology | Admitting: Cardiology

## 2024-10-02 ENCOUNTER — Encounter (HOSPITAL_COMMUNITY): Payer: Self-pay | Admitting: Cardiology

## 2024-10-02 ENCOUNTER — Other Ambulatory Visit: Payer: Self-pay

## 2024-10-02 DIAGNOSIS — Z79899 Other long term (current) drug therapy: Secondary | ICD-10-CM | POA: Diagnosis not present

## 2024-10-02 DIAGNOSIS — E7849 Other hyperlipidemia: Secondary | ICD-10-CM

## 2024-10-02 DIAGNOSIS — I10 Essential (primary) hypertension: Secondary | ICD-10-CM | POA: Insufficient documentation

## 2024-10-02 DIAGNOSIS — Z87891 Personal history of nicotine dependence: Secondary | ICD-10-CM | POA: Insufficient documentation

## 2024-10-02 DIAGNOSIS — E119 Type 2 diabetes mellitus without complications: Secondary | ICD-10-CM | POA: Diagnosis not present

## 2024-10-02 DIAGNOSIS — I6529 Occlusion and stenosis of unspecified carotid artery: Secondary | ICD-10-CM | POA: Insufficient documentation

## 2024-10-02 DIAGNOSIS — I2510 Atherosclerotic heart disease of native coronary artery without angina pectoris: Secondary | ICD-10-CM

## 2024-10-02 DIAGNOSIS — I493 Ventricular premature depolarization: Secondary | ICD-10-CM | POA: Diagnosis not present

## 2024-10-02 DIAGNOSIS — Z955 Presence of coronary angioplasty implant and graft: Secondary | ICD-10-CM | POA: Insufficient documentation

## 2024-10-02 DIAGNOSIS — Z7982 Long term (current) use of aspirin: Secondary | ICD-10-CM | POA: Diagnosis not present

## 2024-10-02 DIAGNOSIS — E785 Hyperlipidemia, unspecified: Secondary | ICD-10-CM | POA: Diagnosis not present

## 2024-10-02 DIAGNOSIS — Z9861 Coronary angioplasty status: Secondary | ICD-10-CM

## 2024-10-02 DIAGNOSIS — I25118 Atherosclerotic heart disease of native coronary artery with other forms of angina pectoris: Secondary | ICD-10-CM | POA: Insufficient documentation

## 2024-10-02 DIAGNOSIS — Z7902 Long term (current) use of antithrombotics/antiplatelets: Secondary | ICD-10-CM | POA: Diagnosis not present

## 2024-10-02 HISTORY — PX: CORONARY STENT INTERVENTION: CATH118234

## 2024-10-02 HISTORY — PX: CORONARY ULTRASOUND/IVUS: CATH118244

## 2024-10-02 HISTORY — PX: CORONARY PRESSURE/FFR WITH 3D MAPPING: CATH118309

## 2024-10-02 LAB — POCT ACTIVATED CLOTTING TIME
Activated Clotting Time: 400 s
Activated Clotting Time: 429 s
Activated Clotting Time: 769 s
Activated Clotting Time: 239 s
Activated Clotting Time: 302 s
Activated Clotting Time: 262 s
Activated Clotting Time: 308 s

## 2024-10-02 LAB — GLUCOSE, CAPILLARY: Glucose-Capillary: 146 mg/dL — ABNORMAL HIGH (ref 70–99)

## 2024-10-02 MED ORDER — FENTANYL CITRATE (PF) 100 MCG/2ML IJ SOLN
INTRAMUSCULAR | Status: AC
  Filled 2024-10-02: qty 2

## 2024-10-02 MED ORDER — SODIUM CHLORIDE 0.9% FLUSH
3.0000 mL | Freq: Two times a day (BID) | INTRAVENOUS | Status: DC
  Administered 2024-10-02 – 2024-10-03 (×2): 3 mL via INTRAVENOUS

## 2024-10-02 MED ORDER — MORPHINE SULFATE (PF) 2 MG/ML IV SOLN
1.0000 mg | INTRAVENOUS | Status: DC | PRN
  Administered 2024-10-02: 1 mg via INTRAVENOUS
  Filled 2024-10-02: qty 1

## 2024-10-02 MED ORDER — IOHEXOL 350 MG/ML SOLN
INTRAVENOUS | Status: DC | PRN
Start: 2024-10-02 — End: 2024-10-02
  Administered 2024-10-02: 190 mL

## 2024-10-02 MED ORDER — CLOPIDOGREL BISULFATE 75 MG PO TABS
75.0000 mg | ORAL_TABLET | Freq: Every day | ORAL | Status: DC
  Administered 2024-10-03: 75 mg via ORAL
  Filled 2024-10-02: qty 1

## 2024-10-02 MED ORDER — PANTOPRAZOLE SODIUM 20 MG PO TBEC
20.0000 mg | DELAYED_RELEASE_TABLET | Freq: Two times a day (BID) | ORAL | Status: DC
  Administered 2024-10-02 – 2024-10-03 (×2): 20 mg via ORAL
  Filled 2024-10-02 (×2): qty 1

## 2024-10-02 MED ORDER — SODIUM CHLORIDE 0.9 % IV SOLN: 250.0000 mL | INTRAVENOUS | Status: DC | PRN

## 2024-10-02 MED ORDER — SODIUM CHLORIDE 0.9 % IV SOLN
INTRAVENOUS | Status: AC | PRN
  Administered 2024-10-02: 10 mL/h via INTRAVENOUS

## 2024-10-02 MED ORDER — HEPARIN SODIUM (PORCINE) 1000 UNIT/ML IJ SOLN
INTRAMUSCULAR | Status: DC | PRN
  Administered 2024-10-02: 8000 [IU] via INTRAVENOUS
  Administered 2024-10-02: 3000 [IU] via INTRAVENOUS
  Administered 2024-10-02: 4000 [IU] via INTRAVENOUS

## 2024-10-02 MED ORDER — METHYLPREDNISOLONE SODIUM SUCC 125 MG IJ SOLR: 125.0000 mg | Freq: Once | INTRAMUSCULAR | Status: DC

## 2024-10-02 MED ORDER — FENTANYL CITRATE (PF) 100 MCG/2ML IJ SOLN
INTRAMUSCULAR | Status: DC | PRN
  Administered 2024-10-02 (×7): 25 ug via INTRAVENOUS

## 2024-10-02 MED ORDER — HEPARIN (PORCINE) IN NACL 1000-0.9 UT/500ML-% IV SOLN
INTRAVENOUS | Status: DC | PRN
  Administered 2024-10-02: 1500 mL

## 2024-10-02 MED ORDER — HEPARIN SODIUM (PORCINE) 1000 UNIT/ML IJ SOLN
INTRAMUSCULAR | Status: AC
  Filled 2024-10-02: qty 10

## 2024-10-02 MED ORDER — ASPIRIN 81 MG PO CHEW: 81.0000 mg | CHEWABLE_TABLET | ORAL | Status: DC

## 2024-10-02 MED ORDER — HEPARIN SODIUM (PORCINE) 1000 UNIT/ML IJ SOLN
INTRAMUSCULAR | Status: AC
Start: 2024-10-02 — End: 2024-10-02
  Filled 2024-10-02: qty 10

## 2024-10-02 MED ORDER — LORAZEPAM 0.5 MG PO TABS: 0.5000 mg | ORAL_TABLET | Freq: Every day | ORAL | Status: DC

## 2024-10-02 MED ORDER — AMLODIPINE BESYLATE 5 MG PO TABS
5.0000 mg | ORAL_TABLET | Freq: Every day | ORAL | Status: DC
  Administered 2024-10-03: 5 mg via ORAL
  Filled 2024-10-02: qty 1

## 2024-10-02 MED ORDER — NITROGLYCERIN 1 MG/10 ML FOR IR/CATH LAB
INTRA_ARTERIAL | Status: AC
  Filled 2024-10-02: qty 10

## 2024-10-02 MED ORDER — MIDAZOLAM HCL 2 MG/2ML IJ SOLN
INTRAMUSCULAR | Status: AC
  Filled 2024-10-02: qty 2

## 2024-10-02 MED ORDER — ASPIRIN 81 MG PO TBEC
81.0000 mg | DELAYED_RELEASE_TABLET | Freq: Every day | ORAL | Status: DC
  Administered 2024-10-03: 81 mg via ORAL
  Filled 2024-10-02: qty 1

## 2024-10-02 MED ORDER — VERAPAMIL HCL 2.5 MG/ML IV SOLN
INTRAVENOUS | Status: AC
Start: 2024-10-02 — End: 2024-10-02
  Filled 2024-10-02: qty 2

## 2024-10-02 MED ORDER — HYDRALAZINE HCL 20 MG/ML IJ SOLN: 10.0000 mg | INTRAMUSCULAR | Status: AC | PRN

## 2024-10-02 MED ORDER — VERAPAMIL HCL 2.5 MG/ML IV SOLN
INTRAVENOUS | Status: DC | PRN
  Administered 2024-10-02: 10 mL via INTRA_ARTERIAL

## 2024-10-02 MED ORDER — NITROGLYCERIN 1 MG/10 ML FOR IR/CATH LAB
INTRA_ARTERIAL | Status: DC | PRN
  Administered 2024-10-02 (×5): 200 ug via INTRACORONARY

## 2024-10-02 MED ORDER — SODIUM CHLORIDE 0.9% FLUSH: 3.0000 mL | Freq: Two times a day (BID) | INTRAVENOUS | Status: DC

## 2024-10-02 MED ORDER — ACETAMINOPHEN 325 MG PO TABS: 650.0000 mg | ORAL_TABLET | ORAL | Status: DC | PRN

## 2024-10-02 MED ORDER — LIDOCAINE HCL (PF) 1 % IJ SOLN
INTRAMUSCULAR | Status: AC
  Filled 2024-10-02: qty 30

## 2024-10-02 MED ORDER — FREE WATER: 500.0000 mL | Freq: Once | Status: DC

## 2024-10-02 MED ORDER — MIDAZOLAM HCL 2 MG/2ML IJ SOLN
INTRAMUSCULAR | Status: DC | PRN
  Administered 2024-10-02 (×2): 1 mg via INTRAVENOUS

## 2024-10-02 MED ORDER — CLOPIDOGREL BISULFATE 75 MG PO TABS: 75.0000 mg | ORAL_TABLET | ORAL | Status: DC

## 2024-10-02 MED ORDER — SODIUM CHLORIDE 0.9% FLUSH: 3.0000 mL | INTRAVENOUS | Status: DC | PRN

## 2024-10-02 MED ORDER — HYDROCORTISONE SOD SUC (PF) 100 MG IJ SOLR
100.0000 mg | Freq: Once | INTRAMUSCULAR | Status: AC
  Administered 2024-10-02: 100 mg via INTRAVENOUS
  Filled 2024-10-02 (×2): qty 2

## 2024-10-02 MED ORDER — DIPHENHYDRAMINE HCL 50 MG/ML IJ SOLN: 25.0000 mg | Freq: Once | INTRAMUSCULAR | Status: DC

## 2024-10-02 MED ORDER — BENAZEPRIL HCL 20 MG PO TABS
20.0000 mg | ORAL_TABLET | Freq: Every day | ORAL | Status: DC
Start: 2024-10-03 — End: 2024-10-03
  Administered 2024-10-03: 20 mg via ORAL
  Filled 2024-10-02: qty 1

## 2024-10-02 MED ORDER — LABETALOL HCL 5 MG/ML IV SOLN: 10.0000 mg | INTRAVENOUS | Status: AC | PRN

## 2024-10-02 MED ORDER — NITROGLYCERIN 0.4 MG SL SUBL
0.4000 mg | SUBLINGUAL_TABLET | SUBLINGUAL | Status: DC | PRN
Start: 2024-10-02 — End: 2024-10-03

## 2024-10-02 MED ORDER — VERAPAMIL HCL 2.5 MG/ML IV SOLN
INTRAVENOUS | Status: AC
  Filled 2024-10-02: qty 2

## 2024-10-02 MED ORDER — ROSUVASTATIN CALCIUM 5 MG PO TABS
10.0000 mg | ORAL_TABLET | Freq: Every day | ORAL | Status: DC
  Administered 2024-10-02: 10 mg via ORAL
  Filled 2024-10-02: qty 2

## 2024-10-02 MED ORDER — ONDANSETRON HCL 4 MG/2ML IJ SOLN: 4.0000 mg | Freq: Four times a day (QID) | INTRAMUSCULAR | Status: DC | PRN

## 2024-10-02 NOTE — Progress Notes (Signed)
 TR BAND REMOVAL  LOCATION:  R  radial  DEFLATED PER PROTOCOL:   yes  TIME BAND OFF / DRESSING APPLIED:    1555  SITE UPON ARRIVAL:    Level 0  SITE AFTER BAND REMOVAL:    Level 0  CIRCULATION SENSATION AND MOVEMENT:    Within Normal Limits   COMMENTS:

## 2024-10-02 NOTE — Plan of Care (Signed)

## 2024-10-02 NOTE — Plan of Care (Signed)
  Problem: Education: Goal: Understanding of CV disease, CV risk reduction, and recovery process will improve Outcome: Progressing Goal: Individualized Educational Video(s) Outcome: Progressing   Problem: Activity: Goal: Ability to return to baseline activity level will improve Outcome: Progressing   

## 2024-10-02 NOTE — Interval H&P Note (Signed)
 History and Physical Interval Note:  10/02/2024 8:49 AM  Melinda Acevedo  has presented today for surgery, with the diagnosis of cad.  The various methods of treatment have been discussed with the patient and family. After consideration of risks, benefits and other options for treatment, the patient has consented to  Procedure(s): CORONARY STENT INTERVENTION (N/A) as a surgical intervention.  The patient's history has been reviewed, patient examined, no change in status, stable for surgery.  I have reviewed the patient's chart and labs.  Questions were answered to the patient's satisfaction.     Romulo Okray J Jenie Parish

## 2024-10-03 ENCOUNTER — Encounter (HOSPITAL_COMMUNITY): Payer: Self-pay | Admitting: Cardiology

## 2024-10-03 DIAGNOSIS — I2510 Atherosclerotic heart disease of native coronary artery without angina pectoris: Secondary | ICD-10-CM | POA: Diagnosis not present

## 2024-10-03 DIAGNOSIS — I493 Ventricular premature depolarization: Secondary | ICD-10-CM | POA: Diagnosis not present

## 2024-10-03 DIAGNOSIS — E785 Hyperlipidemia, unspecified: Secondary | ICD-10-CM | POA: Diagnosis not present

## 2024-10-03 DIAGNOSIS — Z9861 Coronary angioplasty status: Secondary | ICD-10-CM | POA: Diagnosis not present

## 2024-10-03 DIAGNOSIS — I6529 Occlusion and stenosis of unspecified carotid artery: Secondary | ICD-10-CM | POA: Diagnosis not present

## 2024-10-03 DIAGNOSIS — I25118 Atherosclerotic heart disease of native coronary artery with other forms of angina pectoris: Secondary | ICD-10-CM | POA: Diagnosis not present

## 2024-10-03 LAB — CBC
HCT: 36.7 % (ref 36.0–46.0)
Hemoglobin: 11.9 g/dL — ABNORMAL LOW (ref 12.0–15.0)
MCH: 30.8 pg (ref 26.0–34.0)
MCHC: 32.4 g/dL (ref 30.0–36.0)
MCV: 95.1 fL (ref 80.0–100.0)
Platelets: 296 K/uL (ref 150–400)
RBC: 3.86 MIL/uL — ABNORMAL LOW (ref 3.87–5.11)
RDW: 14 % (ref 11.5–15.5)
WBC: 11.5 K/uL — ABNORMAL HIGH (ref 4.0–10.5)
nRBC: 0 % (ref 0.0–0.2)

## 2024-10-03 LAB — BASIC METABOLIC PANEL WITH GFR
Anion gap: 9 (ref 5–15)
BUN: 7 mg/dL — ABNORMAL LOW (ref 8–23)
CO2: 25 mmol/L (ref 22–32)
Calcium: 8.9 mg/dL (ref 8.9–10.3)
Chloride: 105 mmol/L (ref 98–111)
Creatinine, Ser: 0.61 mg/dL (ref 0.44–1.00)
GFR, Estimated: >60 mL/min (ref >60)
Glucose, Bld: 116 mg/dL — ABNORMAL HIGH (ref 70–99)
Potassium: 3.3 mmol/L — ABNORMAL LOW (ref 3.5–5.1)
Sodium: 139 mmol/L (ref 135–145)

## 2024-10-03 NOTE — Progress Notes (Addendum)
 CARDIAC REHAB PHASE I    PRE:                Rate/Rhythm:86 SR   BP:                  Sitting: 131/68                                                  SpO2: RA   MODE:            Ambulation:  140 ft       POST:             Rate/Rhythm: 101 w/ occasional pvcs   BP:                  Sitting: 156/68                                      SpO2: RA   Pt amb with standby assistance, pt denies CP and SOB during amb, experienced a couple pvcs on walk but returned to normal at rest and returned to room w/o complaint.   Post stent education including site care, restrictions, risk factors, exercise guidelines, NTG use, antiplatelet therapy importance, heart healthy diet, CRP2 reviewed. All questions and concerns addressed. Will refer to   [facility] for CRP2. Plan for home later today.  [id number]   Isaiah JAYSON Liverpool, RN BSN 10/03/2024 10:05 AM

## 2024-10-03 NOTE — Discharge Summary (Addendum)
 Discharge Summary   Patient ID: Melinda Acevedo MRN: [id number]; DOB: 1940/12/23  Admit date: 10/02/2024 Discharge date: 10/03/2024  PCP:  Lonna Millman, DO   [facility] Providers Cardiologist:  Alvan Carrier, MD     Discharge Diagnoses  Principal Problem:   Coronary arteriosclerosis after percutaneous transluminal coronary angioplasty (PTCA)  Diagnostic Studies/Procedures   Cath: 10/02/2024  Successful percutaneous coronary intervention mid RCA LAD/diag        Intravascular ultrasound        PTCA and stent placement 2.5 X 20 mm Synergy drug-eluting stent mid RCA             Post dilatation with 2.75 and 3.0 mm [place] balloons up to 20 atm        PTCA and stent placement 2.5 X 32 mm Synergy drug-eluting stent mid LAD             Post dilatation with 2.75 and 3.0 mm [place] balloons up to 20 atm        Pressure wire guided PTCA to diag 2 w/ 2/0 X 8 mm balloon        Final kissing balloon angioplasty to LAD/diag w/ 3.0 mm [place] and 2.0 mm semi-compliant balloons respectively         Unusually delayed procedure time due to the following reasons: Complex multivessel PCI RCA PCI requiring multiple balloons to overcome residual waist LAD/diag PCI requiring multiple balloons for kissing balloon angioplasty of bifurcation lesion Use of IVUS and RFR in multivessel PCI   Will keep the patient overnight with hydration and monitoring with anticipated discharge on 10/15 AM.   Newman JINNY Lawrence, MD   _____________   History of Present Illness   Melinda Acevedo is a 83 y.o. female with PMH of HTN, HLD, DM, and CAD s/p stenting of the LAD '10 who presented to the office and seen by Dr. Lawrence with complaints of dyspnea and fatigue similar to what she experienced with her prior PCI. Given her symptoms she was set up for outpatient cardiac cath.    Hospital Course    Underwent cardiac cath noted above on 10/14 successful PCI/DES x1 to 80% mid RCA lesion, as well as DES x 1 to mid  LAD/diagonal requiring kissing balloon angioplasty of bifurcation presented with the use of IVUS and RFR.  Initiated with DAPT with aspirin /Plavix  for at least 6 months.  She was monitored overnight given complexity of procedure.  No complications noted. Agreeable to lipid clinic referral   General: Well developed, well nourished, female appearing in no acute distress. Head: Normocephalic, atraumatic.  Neck: Supple without bruits, JVD. Lungs:  Resp regular and unlabored, CTA. Heart: RRR, S1, S2, no S3, S4, or murmur; no rub. Abdomen: Soft, non-tender, non-distended with normoactive bowel sounds. Extremities: No clubbing, cyanosis, edema. Distal pedal pulses are 2+ bilaterally. Right radial cath site stable without bruising or hematoma Neuro: Alert and oriented X 3. Moves all extremities spontaneously. Psych: Normal affect.   Did the patient have an acute coronary syndrome (MI, NSTEMI, STEMI, etc) this admission?:  No                               Did the patient have a percutaneous coronary intervention (stent / angioplasty)?:  Yes.     Cath/PCI Registry Performance & Quality Measures: Aspirin  prescribed? - Yes ADP Receptor Inhibitor (Plavix /Clopidogrel , Brilinta/Ticagrelor or Effient/Prasugrel) prescribed (includes medically managed patients)? - Yes High Intensity  Statin (Lipitor 40-80mg  or Crestor  20-40mg ) prescribed? - No - intolerant, referred to lipid clinic For EF <40%, was ACEI/ARB prescribed? - Not Applicable (EF >/= 40%) For EF <40%, Aldosterone Antagonist (Spironolactone or Eplerenone) prescribed? - Not Applicable (EF >/= 40%) Cardiac Rehab Phase II ordered? - Yes       The patient will be scheduled for a TOC follow up appointment in 10-14 days.  A message has been sent to the [facility] and Scheduling Pool at the office where the patient should be seen for follow up.  _____________  Discharge Vitals Blood pressure (!) 140/51, pulse 70, temperature 98.2 F (36.8 C),  temperature source Oral, resp. rate 18, height 5' (1.524 m), weight 71 kg, SpO2 92%.  Filed Weights   10/02/24 0916  Weight: 71 kg    Labs & Radiologic Studies  CBC Recent Labs    10/03/24 0432  WBC 11.5*  HGB 11.9*  HCT 36.7  MCV 95.1  PLT 296   Basic Metabolic Panel Recent Labs    89/84/74 0432  NA 139  K 3.3*  CL 105  CO2 25  GLUCOSE 116*  BUN 7*  CREATININE 0.61  CALCIUM  8.9   Liver Function Tests No results for input(s): AST, ALT, ALKPHOS, BILITOT, PROT, ALBUMIN in the last 72 hours. No results for input(s): LIPASE, AMYLASE in the last 72 hours. High Sensitivity Troponin:   No results for input(s): TROPONINIHS in the last 720 hours.  No results for input(s): TRNPT in the last 720 hours.  BNP Invalid input(s): POCBNP No results for input(s): PROBNP in the last 72 hours.  No results for input(s): BNP in the last 72 hours.  D-Dimer No results for input(s): DDIMER in the last 72 hours. Hemoglobin A1C No results for input(s): HGBA1C in the last 72 hours. Fasting Lipid Panel No results for input(s): CHOL, HDL, LDLCALC, TRIG, CHOLHDL, LDLDIRECT in the last 72 hours. No results found for: LIPOA  Thyroid Function Tests No results for input(s): TSH, T4TOTAL, T3FREE, THYROIDAB in the last 72 hours.  Invalid input(s): FREET3 _____________  CARDIAC CATHETERIZATION Result Date: 10/02/2024 Images from the original result were not included. Coronary intervention 10/02/2024 (See previous angiogram report dated 09/04/2024): Successful percutaneous coronary intervention mid RCA LAD/diag        Intravascular ultrasound        PTCA and stent placement 2.5 X 20 mm Synergy drug-eluting stent mid RCA             Post dilatation with 2.75 and 3.0 mm [place] balloons up to 20 atm        PTCA and stent placement 2.5 X 32 mm Synergy drug-eluting stent mid LAD             Post dilatation with 2.75 and 3.0 mm [place] balloons up to 20 atm         Pressure wire guided PTCA to diag 2 w/ 2/0 X 8 mm balloon        Final kissing balloon angioplasty to LAD/diag w/ 3.0 mm [place] and 2.0 mm semi-compliant balloons respectively   Unusually delayed procedure time due to the following reasons: Complex multivessel PCI RCA PCI requiring multiple balloons to overcome residual waist LAD/diag PCI requiring multiple balloons for kissing balloon angioplasty of bifurcation lesion Use of IVUS and RFR in multivessel PCI Will keep the patient overnight with hydration and monitoring with anticipated discharge on 10/15 AM. Newman JINNY Lawrence, MD  CARDIAC CATHETERIZATION Addendum Date: 09/04/2024 Coronary angiography 09/04/2024:  LM: No significant disease LAD: Mid 70% stenosis with moderate calcification with adjacent large bifurcating diagonal with ostial 80% stenosis of both branches          LAD RFR 0.78          LAD MLA 3.5 mm2 just after bifurcation with diag, and further downstream MLA 3.1 mm2. Lcx: Mid 40% disease RCA: Medium caliber dominant vessel          Prox-mid focal 80% stenosis, just before prior BMS that has 0% late lumen loss LVEDP 14 mmHg Conclusion: Severe multivessel disease Patient had expressed her preference towards PCI over CABG at the beginning of the case.  Initial report given that the patient was the patient has been with LAD stent.  I suspected that her mid LAD stenosis may be in-stent restenosis.  However, on further review, her stent is in fact in mid RCA and is fairly patent.  If this was mid LAD in-stent restenosis, I could to perform drug-coated balloon angioplasty at mid LAD without adding another layer of stent, and left the diagonal ostial disease alone without significant plaque shift.  However, given that her mid LAD disease is a native vessel, stenting mid LAD would have high risk of losing bifurcating large diagonal's that supply a large amount of myocardium.  2 stent strategy would also be challenging given ostial stenosis of both bifurcating  branches. Although she is 85 and has personal preference for PCI, given her diabetes and her multivessel disease with challenging anatomy for PCI, I would recommend CVTS consultation and heart team discussion.  Patient lives with her 54 year old husband, but has 5 children, with 1 daughter living 20 minutes from her.  I do think she would have adequate postoperative social support.  She does have back pain, but is fairly independent and able to get around with her daily activities.  Her symptoms are primarily exertional dyspnea, with normal LVEF and LVEDP, and occasional chest pain on exertion. If patient and surgeons agree, I do think CABG could have better long-term benefit.  If surgery is not an option for perioperative risks or patient preference, we could consider high risk PCI to LAD/diagonal with 2 stent strategy, stenting one of the 2 diagonal branches and hopefully doing angioplasty on the other one, in addition to mid RCA PCI. Newman JINNY Lawrence, MD  Addendum Date: 09/04/2024 Coronary angiography 09/04/2024: LM: No significant disease LAD: Mid 70% stenosis with moderate calcification with adjacent large bifurcating diagonal with ostial 80% stenosis of both branches          LAD RFR 0.78          LAD MLA 3.5 mm2 just after bifurcation with diag, and further downstream MLA 3.1 mm2. Lcx: Mid 40% disease RCA: Medium caliber dominant vessel          Prox-mid focal 80% stenosis, just before prior BMS that has 0% late lumen loss LVEDP 14 mmHg Conclusion: Severe multivessel disease Patient had expressed her preference towards PCI over CABG at the beginning of the case.  Initial report given that the patient was the patient has been with LAD stent.  I suspected that her mid LAD stenosis may be in-stent restenosis.  However, on further review, her stent is in fact in mid RCA and is fairly patent.  If this was mid LAD in-stent restenosis, I could to perform drug-coated balloon angioplasty at mid LAD without adding  another layer of stent, and left the diagonal ostial disease alone without significant plaque  shift.  However, given that her mid LAD disease is a native vessel, stenting mid LAD would have high risk of losing bifurcating large diagonal's that supply a large amount of myocardium.  2 stent strategy would also be challenging given ostial stenosis of both bifurcating branches. Although she is 71 and has personal preference for PCI, given her diabetes and her multivessel disease with challenging anatomy for PCI, I would recommend CVTS consultation and heart team discussion.  Patient lives with her 60 year old husband, but has 5 children, with 1 daughter living 20 minutes from her.  I do think she would have adequate postoperative social support.  She does have back pain, but is fairly independent and able to get around with her daily activities.  Her symptoms are primarily exertional dyspnea, with normal LVEF and LVEDP, and occasional chest pain on exertion. If patient and surgeons agree, I do think CABG could have better long-term benefit.  If surgery is not an option for perioperative risks or patient preference, we could consider high risk PCI to LAD/diagonal with 2 stent strategy, stenting one of the 2 diagonal branches and hopefully doing angioplasty on the other one, in addition to mid RCA PCI. Newman JINNY Lawrence, MD   Result Date: 09/04/2024 Images from the original result were not included. Coronary angiography 09/04/2024: LM: No significant disease LAD: Mid 70% stenosis with moderate calcification with adjacent large bifurcating diagonal with ostial 80% stenosis of both branches          LAD RFR 0.78          LAD MLA 3.5 mm2 just after bifurcation with diag, and further downstream MLA 3.1 mm2. Lcx: Mid 40% disease RCA: Medium caliber dominant vessel          Prox-mid focal 80% stenosis, just before prior BMS that has 0% late lumen loss LVEDP 14 mmHg Conclusion: Severe multivessel disease Patient had expressed  her preference towards PCI over CABG at the beginning of the case.  Initial report given that the patient was the patient has been with LAD stent.  I suspected that her mid LAD stenosis may be in-stent restenosis.  However, on further review, her stent is in fact in mid RCA and is fairly patent.  If this was mid LAD in-stent restenosis, I could to perform drug-coated balloon angioplasty at mid LAD without adding another layer of stent, and left the diagonal ostial disease alone without significant plaque shift.  However, given that her mid LAD disease is a native vessel, stenting mid LAD would have high risk of losing bifurcating large diagonal's that supply a large amount of myocardium.  2 stent strategy would also be challenging given ostial stenosis of both bifurcating branches. Although she is 29 and has personal preference for PCI, given her diabetes and her multivessel disease with challenging anatomy for PCI, I would recommend CVTS consultation and heart team discussion.  Patient lives with her 58 year old husband, but has 5 children, with 1 daughter living 20 minutes from her.  I do think she would have adequate postoperative social support.  She does have back pain, but is fairly independent and able to get around with her daily activities.  Her symptoms are primarily exertional dyspnea, with normal LVEF and LVEDP, and occasional chest pain on exertion. If patient and surgeons agree, I do think CABG could have better long-term benefit.  If surgery is not an option for perioperative risks or patient preference, we could consider high risk PCI to LAD/diagonal with 2 stent  strategy, stenting one of the 2 diagonal branches and hopefully doing angioplasty on the other one, in addition to mid RCA PCI. Manish JINNY Lawrence, MD   Disposition Pt is being discharged home today in good condition.  Follow-up Plans & Appointments  Discharge Instructions     AMB Referral to Cardiac Rehabilitation - Phase II    Complete by: As directed    Diagnosis: Coronary Stents   After initial evaluation and assessments completed: Virtual Based Care may be provided alone or in conjunction with Phase 2 Cardiac Rehab based on patient barriers.: Yes   Intensive Cardiac Rehabilitation (ICR) [facility] location only OR Traditional Cardiac Rehabilitation (TCR) *If criteria for ICR are not met will enroll in TCR ([facility] only): Yes   AMB Referral to [facility]   Complete by: As directed    Reason For Referral: Lipids   Diet - low sodium heart healthy   Complete by: As directed    Discharge instructions   Complete by: As directed    Radial Site Care Refer to this sheet in the next few weeks. These instructions provide you with information on caring for yourself after your procedure. Your caregiver may also give you more specific instructions. Your treatment has been planned according to current medical practices, but problems sometimes occur. Call your caregiver if you have any problems or questions after your procedure. HOME CARE INSTRUCTIONS You may shower the day after the procedure. Remove the bandage (dressing) and gently wash the site with plain soap and water . Gently pat the site dry.  Do not apply powder or lotion to the site.  Do not submerge the affected site in water  for 3 to 5 days.  Inspect the site at least twice daily.  Do not flex or bend the affected arm for 24 hours.  No lifting over 5 pounds (2.3 kg) for 5 days after your procedure.  Do not drive home if you are discharged the same day of the procedure. Have someone else drive you.  You may drive 24 hours after the procedure unless otherwise instructed by your caregiver.  What to expect: Any bruising will usually fade within 1 to 2 weeks.  Blood that collects in the tissue (hematoma) may be painful to the touch. It should usually decrease in size and tenderness within 1 to 2 weeks.  SEEK IMMEDIATE MEDICAL CARE IF: You have unusual pain at the radial  site.  You have redness, warmth, swelling, or pain at the radial site.  You have drainage (other than a small amount of blood on the dressing).  You have chills.  You have a fever or persistent symptoms for more than 72 hours.  You have a fever and your symptoms suddenly get worse.  Your arm becomes pale, cool, tingly, or numb.  You have heavy bleeding from the site. Hold pressure on the site.   PLEASE DO NOT MISS ANY DOSES OF YOUR PLAVIX !!!!! Also keep a log of you blood pressures and bring back to your follow up appt. Please call the office with any questions.   Patients taking blood thinners should generally stay away from medicines like ibuprofen, Advil, Motrin, naproxen, and Aleve due to risk of stomach bleeding. You may take Tylenol  as directed or talk to your primary doctor about alternatives.  PLEASE ENSURE THAT YOU DO NOT RUN OUT OF YOUR PLAVIX . This medication is very important to remain on for at least 6months. IF you have issues obtaining this medication due to cost please CALL  the office 3-5 business days prior to running out in order to prevent missing doses of this medication.   Increase activity slowly   Complete by: As directed        Discharge Medications Allergies as of 10/03/2024       Reactions   Alendronate    Made bones hurt   Cinoxacin Hives   Contrast Media [iodinated Contrast Media] Diarrhea   Only kidney IV dyes - Iodine?   Diltiazem    made her feel like she was in a cloud Blood Pressure dropped   Keflex [cephalexin] Hives   Naproxen Other (See Comments)   Stomach upset   Nifedipine Other (See Comments)   Unknown reaction   Norfloxacin Hives   Poison Ivy Extract Itching   Pravastatin Sodium Other (See Comments)   Stomach cramps   Trimethoprim Hives   Valdecoxib Other (See Comments)   Lower belly pain        Medication List     TAKE these medications    amLODipine  5 MG tablet Commonly known as: NORVASC  Take 1 tablet by mouth once  daily   aspirin  EC 81 MG tablet Take 1 tablet (81 mg total) by mouth daily. With meal, swallow whole.   benazepril  40 MG tablet Commonly known as: LOTENSIN  Take 1 tablet by mouth once daily What changed:  how much to take additional instructions   clopidogrel  75 MG tablet Commonly known as: Plavix  Take 600 mg (8 tablets) on day one and then on day two start taking 75 mg (1 tablet) once daily What changed:  how much to take how to take this when to take this   Crestor  10 MG tablet Generic drug: rosuvastatin  TAKE 1 TABLET BY MOUTH ON MONDAY, WEDNESDAY AND FRIDAY   LORazepam  0.5 MG tablet Commonly known as: ATIVAN  Take 0.5 mg by mouth at bedtime.   nitroGLYCERIN  0.4 MG SL tablet Commonly known as: NITROSTAT  Place 0.4 mg under the tongue every 5 (five) minutes as needed for chest pain.   pantoprazole  20 MG tablet Commonly known as: Protonix  Take 1 tablet (20 mg total) by mouth 2 (two) times daily.         Outstanding Labs/Studies  N/a   Duration of Discharge Encounter: APP Time: 20 minutes   Signed, Manuelita Rummer, NP 10/03/2024, 9:48 AM

## 2024-10-03 NOTE — Plan of Care (Signed)
 Problem: Education: Goal: Understanding of CV disease, CV risk reduction, and recovery process will improve 10/03/2024 1015 by Emil Roselie SAUNDERS, RN Outcome: Adequate for Discharge 10/03/2024 1015 by Emil Roselie SAUNDERS, RN Outcome: Adequate for Discharge Goal: Individualized Educational Video(s) 10/03/2024 1015 by Emil Roselie SAUNDERS, RN Outcome: Adequate for Discharge 10/03/2024 1015 by Emil Roselie SAUNDERS, RN Outcome: Adequate for Discharge   Problem: Activity: Goal: Ability to return to baseline activity level will improve 10/03/2024 1015 by Emil Roselie SAUNDERS, RN Outcome: Adequate for Discharge 10/03/2024 1015 by Emil Roselie SAUNDERS, RN Outcome: Adequate for Discharge   Problem: Cardiovascular: Goal: Ability to achieve and maintain adequate cardiovascular perfusion will improve 10/03/2024 1015 by Emil Roselie SAUNDERS, RN Outcome: Adequate for Discharge 10/03/2024 1015 by Emil Roselie SAUNDERS, RN Outcome: Adequate for Discharge Goal: Vascular access site(s) Level 0-1 will be maintained 10/03/2024 1015 by Emil Roselie SAUNDERS, RN Outcome: Adequate for Discharge 10/03/2024 1015 by Emil Roselie SAUNDERS, RN Outcome: Adequate for Discharge   Problem: Health Behavior/Discharge Planning: Goal: Ability to safely manage health-related needs after discharge will improve 10/03/2024 1015 by Emil Roselie SAUNDERS, RN Outcome: Adequate for Discharge 10/03/2024 1015 by Emil Roselie SAUNDERS, RN Outcome: Adequate for Discharge   Problem: Education: Goal: Knowledge of General Education information will improve Description: Including pain rating scale, medication(s)/side effects and non-pharmacologic comfort measures 10/03/2024 1015 by Emil Roselie SAUNDERS, RN Outcome: Adequate for Discharge 10/03/2024 1015 by Emil Roselie SAUNDERS, RN Outcome: Adequate for Discharge   Problem: Health Behavior/Discharge Planning: Goal: Ability to manage health-related needs will improve 10/03/2024 1015 by Emil Roselie SAUNDERS, RN Outcome: Adequate for Discharge 10/03/2024 1015 by Emil Roselie SAUNDERS, RN Outcome: Adequate for Discharge   Problem: Clinical Measurements: Goal: Ability to maintain clinical measurements within normal limits will improve 10/03/2024 1015 by Emil Roselie SAUNDERS, RN Outcome: Adequate for Discharge 10/03/2024 1015 by Emil Roselie SAUNDERS, RN Outcome: Adequate for Discharge Goal: Will remain free from infection 10/03/2024 1015 by Emil Roselie SAUNDERS, RN Outcome: Adequate for Discharge 10/03/2024 1015 by Emil Roselie SAUNDERS, RN Outcome: Adequate for Discharge Goal: Diagnostic test results will improve 10/03/2024 1015 by Emil Roselie SAUNDERS, RN Outcome: Adequate for Discharge 10/03/2024 1015 by Emil Roselie SAUNDERS, RN Outcome: Adequate for Discharge Goal: Respiratory complications will improve 10/03/2024 1015 by Emil Roselie SAUNDERS, RN Outcome: Adequate for Discharge 10/03/2024 1015 by Emil Roselie SAUNDERS, RN Outcome: Adequate for Discharge Goal: Cardiovascular complication will be avoided 10/03/2024 1015 by Emil Roselie SAUNDERS, RN Outcome: Adequate for Discharge 10/03/2024 1015 by Emil Roselie SAUNDERS, RN Outcome: Adequate for Discharge   Problem: Activity: Goal: Risk for activity intolerance will decrease 10/03/2024 1015 by Emil Roselie SAUNDERS, RN Outcome: Adequate for Discharge 10/03/2024 1015 by Emil Roselie SAUNDERS, RN Outcome: Adequate for Discharge   Problem: Nutrition: Goal: Adequate nutrition will be maintained 10/03/2024 1015 by Emil Roselie SAUNDERS, RN Outcome: Adequate for Discharge 10/03/2024 1015 by Emil Roselie SAUNDERS, RN Outcome: Adequate for Discharge   Problem: Coping: Goal: Level of anxiety will decrease 10/03/2024 1015 by Emil Roselie SAUNDERS, RN Outcome: Adequate for Discharge 10/03/2024 1015 by Emil Roselie SAUNDERS, RN Outcome: Adequate for Discharge   Problem: Elimination: Goal: Will not experience complications related to bowel motility 10/03/2024  1015 by Emil Roselie SAUNDERS, RN Outcome: Adequate for Discharge 10/03/2024 1015 by Emil Roselie SAUNDERS, RN Outcome: Adequate for Discharge Goal: Will not experience complications related to urinary retention 10/03/2024 1015 by Emil Roselie SAUNDERS, RN Outcome: Adequate for Discharge 10/03/2024 1015 by Emil Roselie SAUNDERS, RN Outcome: Adequate for  Discharge   Problem: Pain Managment: Goal: General experience of comfort will improve and/or be controlled 10/03/2024 1015 by Emil Roselie SAUNDERS, RN Outcome: Adequate for Discharge 10/03/2024 1015 by Emil Roselie SAUNDERS, RN Outcome: Adequate for Discharge   Problem: Safety: Goal: Ability to remain free from injury will improve 10/03/2024 1015 by Emil Roselie SAUNDERS, RN Outcome: Adequate for Discharge 10/03/2024 1015 by Emil Roselie SAUNDERS, RN Outcome: Adequate for Discharge   Problem: Skin Integrity: Goal: Risk for impaired skin integrity will decrease 10/03/2024 1015 by Emil Roselie SAUNDERS, RN Outcome: Adequate for Discharge 10/03/2024 1015 by Emil Roselie SAUNDERS, RN Outcome: Adequate for Discharge

## 2024-10-03 NOTE — Progress Notes (Addendum)
 Discharge Nurse Summary: DC order noted per MD. DC RN at bedside with patient. Patient agreeable with discharge plan, agreeable to transport to dc lounge to await family pickup. AVS printed/reviewed. PIVs removed. Telemonitor returned to charging station. No DME/TOC meds. CP/Edu resolved. Patient dressed and ready to go. All belongings accounted for. R radial site CDI w/o s/s bleeding, drainage, or infection. Patient wheeled downstairs for discharge by private auto.   Rosario CHRISTELLA Lund, RN

## 2024-10-04 LAB — LIPOPROTEIN A (LPA): Lipoprotein (a): 37.1 nmol/L — ABNORMAL HIGH (ref <75.0)

## 2024-10-10 ENCOUNTER — Ambulatory Visit: Admitting: Cardiology

## 2024-10-10 ENCOUNTER — Telehealth (HOSPITAL_COMMUNITY): Payer: Self-pay

## 2024-10-10 NOTE — Telephone Encounter (Signed)
Per phase I cardiac rehab, fax referral to [facility]

## 2024-10-15 ENCOUNTER — Other Ambulatory Visit: Payer: Self-pay | Admitting: Cardiology

## 2024-10-18 ENCOUNTER — Encounter: Payer: Self-pay | Admitting: Internal Medicine

## 2024-10-18 ENCOUNTER — Ambulatory Visit: Attending: Internal Medicine | Admitting: Internal Medicine

## 2024-10-18 VITALS — BP 150/72 | HR 87 | Ht 60.0 in | Wt 147.3 lb

## 2024-10-18 DIAGNOSIS — R002 Palpitations: Secondary | ICD-10-CM | POA: Insufficient documentation

## 2024-10-18 MED ORDER — MEXILETINE HCL 150 MG PO CAPS: 150.0000 mg | ORAL_CAPSULE | Freq: Two times a day (BID) | ORAL | 3 refills | Status: DC

## 2024-10-18 NOTE — Progress Notes (Signed)
 HPI Mrs. Somes returns for ongoing evaluation of PVC's and NS AT. She is pleasant 83 yo woman, formely seen in [place] who has a h/o CAD with LAD stenting remotely. She notes that when she had her PCI 15 years ago she never had chest pain and had a very tight LAD. She has not had syncope.  Her main complaints is worsening dyspnea and fatigue. She thinks that her symptoms are just like she had 15 years ago before she had her PCI. She had never had a heart cath since her PCI. With all of the above she underwent left heart cath and PCI of the LAD and RCA after surgical consultation. Her main complaint today is palpitations.  Allergies  Allergen Reactions   Alendronate     Made bones hurt   Cinoxacin Hives   Contrast Media [Iodinated Contrast Media] Diarrhea    Only kidney IV dyes - Iodine?   Diltiazem     made her feel like she was in a cloud Blood Pressure dropped   Keflex [Cephalexin] Hives   Naproxen Other (See Comments)    Stomach upset   Nifedipine Other (See Comments)    Unknown reaction   Norfloxacin Hives   Poison Ivy Extract Itching   Pravastatin Sodium Other (See Comments)    Stomach cramps   Trimethoprim Hives   Valdecoxib Other (See Comments)    Lower belly pain     Current Outpatient Medications  Medication Sig Dispense Refill   amLODipine  (NORVASC ) 5 MG tablet Take 1 tablet by mouth once daily 90 tablet 3   aspirin  EC 81 MG tablet Take 1 tablet (81 mg total) by mouth daily. With meal, swallow whole. 90 tablet 3   benazepril  (LOTENSIN ) 40 MG tablet Take 1 tablet by mouth once daily (Patient taking differently: Take 20-40 mg by mouth daily. Depending on Blood pressure reading) 90 tablet 3   clopidogrel  (PLAVIX ) 75 MG tablet Take 600 mg (8 tablets) on day one and then on day two start taking 75 mg (1 tablet) once daily (Patient taking differently: Take 75 mg by mouth daily. Take 600 mg (8 tablets) on day one and then on day two start taking 75 mg (1 tablet) once  daily) 98 tablet 3   CRESTOR  10 MG tablet TAKE 1 TABLET BY MOUTH ON MONDAY, WEDNESDAY AND FRIDAY 30 tablet 3   LORazepam  (ATIVAN ) 0.5 MG tablet Take 0.5 mg by mouth at bedtime.     mexiletine (MEXITIL) 150 MG capsule Take 1 capsule (150 mg total) by mouth 2 (two) times daily. 180 capsule 3   nitroGLYCERIN  (NITROSTAT ) 0.4 MG SL tablet Place 0.4 mg under the tongue every 5 (five) minutes as needed for chest pain.     pantoprazole  (PROTONIX ) 20 MG tablet Take 1 tablet (20 mg total) by mouth 2 (two) times daily. 180 tablet 3   No current facility-administered medications for this visit.     Past Medical History:  Diagnosis Date   Anxiety    CAD (coronary artery disease)    a. s/p stenting to LAD in 2010 per patient's report b. no ischemia by NST in 09/2020   Carotid artery occlusion    Diabetes mellitus without complication (HCC)    Glaucoma    History of hiatal hernia    Hyperlipidemia    Hypertension    Irregular heart beats    Seasonal allergies    Sleep apnea     ROS:   All systems  reviewed and negative except as noted in the HPI.   Past Surgical History:  Procedure Laterality Date   ABDOMINAL HYSTERECTOMY     CATARACT EXTRACTION Bilateral    CHOLECYSTECTOMY     COLONOSCOPY  2017   Dr.Williams in [place] , [place]   COLONOSCOPY WITH PROPOFOL  N/A 02/06/2021   Procedure: COLONOSCOPY WITH PROPOFOL ;  Surgeon: Eartha Angelia Sieving, MD;  [facility];  Service: Gastroenterology;  Laterality: N/A;  10:30   COLONOSCOPY WITH PROPOFOL  N/A 09/29/2021   Procedure: COLONOSCOPY WITH PROPOFOL ;  Surgeon: Eartha Angelia Sieving, MD;  [facility];  Service: Gastroenterology;  Laterality: N/A;  10:05   CORONARY ANGIOPLASTY     stent   CORONARY PRESSURE/FFR WITH 3D MAPPING N/A 09/04/2024   Procedure: Coronary Pressure/FFR w/3D Mapping;  Surgeon: Elmira Newman PARAS, MD;  [facility];  Service: Cardiovascular;  Laterality: N/A;   CORONARY  PRESSURE/FFR WITH 3D MAPPING N/A 10/02/2024   Procedure: Coronary Pressure/FFR w/3D Mapping;  Surgeon: Elmira Newman PARAS, MD;  [facility];  Service: Cardiovascular;  Laterality: N/A;   CORONARY STENT INTERVENTION N/A 10/02/2024   Procedure: CORONARY STENT INTERVENTION;  Surgeon: Elmira Newman PARAS, MD;  [facility];  Service: Cardiovascular;  Laterality: N/A;   CORONARY ULTRASOUND/IVUS N/A 09/04/2024   Procedure: Coronary Ultrasound/IVUS;  Surgeon: Elmira Newman PARAS, MD;  [facility];  Service: Cardiovascular;  Laterality: N/A;   CORONARY ULTRASOUND/IVUS N/A 10/02/2024   Procedure: Coronary Ultrasound/IVUS;  Surgeon: Elmira Newman PARAS, MD;  [facility];  Service: Cardiovascular;  Laterality: N/A;   ESOPHAGOGASTRODUODENOSCOPY (EGD) WITH PROPOFOL  N/A 02/06/2021   Procedure: ESOPHAGOGASTRODUODENOSCOPY (EGD) WITH PROPOFOL ;  Surgeon: Eartha Angelia Sieving, MD;  [facility];  Service: Gastroenterology;  Laterality: N/A;   ESOPHAGOGASTRODUODENOSCOPY (EGD) WITH PROPOFOL  N/A 04/17/2021   Procedure: ESOPHAGOGASTRODUODENOSCOPY (EGD) WITH PROPOFOL ;  Surgeon: Mavis Anes, MD;  Location: [facility];  Service: General;  Laterality: N/A;   ESOPHAGOGASTRODUODENOSCOPY (EGD) WITH PROPOFOL  N/A 05/14/2021   Procedure: ESOPHAGOGASTRODUODENOSCOPY (EGD) WITH PROPOFOL ;  Surgeon: Mavis Anes, MD;  Location: [facility];  Service: General;  Laterality: N/A;   heart catherization  8010,8000, 2010   IR GASTROSTOMY TUBE REMOVAL  05/13/2021   LEFT HEART CATH AND CORONARY ANGIOGRAPHY N/A 09/04/2024   Procedure: LEFT HEART CATH AND CORONARY ANGIOGRAPHY;  Surgeon: Elmira Newman PARAS, MD;  [facility];  Service: Cardiovascular;  Laterality: N/A;   PEG PLACEMENT N/A 04/17/2021   Procedure: PERCUTANEOUS ENDOSCOPIC GASTROSTOMY (PEG) PLACEMENT;  Surgeon: Mavis Anes, MD;  Location: [facility];  Service: General;  Laterality: N/A;    PEG PLACEMENT N/A 05/14/2021   Procedure: PERCUTANEOUS ENDOSCOPIC GASTROSTOMY (PEG) PLACEMENT;  Surgeon: Mavis Anes, MD;  [facility]: [facility];  Service: General;  Laterality: N/A;   PEG PLACEMENT N/A 11/10/2022   Procedure: PERCUTANEOUS GASTROSTOMY (PEG) PLACEMENT WITHOUT ENDOSCOPY - REPLACEMENT;  Surgeon: Mavis Anes, MD;  [facility];  Service: Gastroenterology;  Laterality: N/A;   POLYPECTOMY  09/29/2021   Procedure: POLYPECTOMY;  Surgeon: Eartha Angelia Sieving, MD;  [facility];  Service: Gastroenterology;;   RECTOCELE REPAIR     TONSILLECTOMY     TUBAL LIGATION  1986   UPPER GASTROINTESTINAL ENDOSCOPY  2015   Dr.Williams in [place], [place]   VAGINAL HYSTERECTOMY  1991     Family History  Problem Relation Age of Onset   COPD Mother    Heart disease Mother    Heart disease  Father    Heart attack Father    Hypertension Sister    Mitral valve prolapse Brother      Social History   Socioeconomic History   Marital status: Married    Spouse name: Not on file   Number of children: Not on file   Years of education: Not on file   Highest education level: Not on file  Occupational History   Not on file  Tobacco Use   Smoking status: Former    Current packs/day: 0.00    Types: Cigarettes    Start date: 59    Quit date: 1964    Years since quitting: 61.8   Smokeless tobacco: Never  Vaping Use   Vaping status: Never Used  Substance and Sexual Activity   Alcohol use: No   Drug use: No   Sexual activity: Not on file  Other Topics Concern   Not on file  Social History Narrative   Not on file   Social Drivers of Health   Financial Resource Strain: Not on file  Food Insecurity: No Food Insecurity (10/02/2024)   Hunger Vital Sign    Worried About Running Out of Food in the Last Year: Never true    Ran Out of Food in the Last Year: Never true  Transportation Needs: No Transportation Needs (10/02/2024)   PRAPARE - Doctor, General Practice (Medical): No    Lack of Transportation (Non-Medical): No  Physical Activity: Not on file  Stress: Not on file  Social Connections: Socially Integrated (10/02/2024)   Social Connection and Isolation Panel    Frequency of Communication with Friends and Family: More than three times a week    Frequency of Social Gatherings with Friends and Family: More than three times a week    Attends Religious Services: More than 4 times per year    Active Member of [employer] or Organizations: Yes    Attends Engineer, Structural: More than 4 times per year    Marital Status: Married  Catering Manager Violence: Not At Risk (10/02/2024)   Humiliation, Afraid, Rape, and Kick questionnaire    Fear of Current or Ex-Partner: No    Emotionally Abused: No    Physically Abused: No    Sexually Abused: No     BP (!) 150/72   Pulse 87   Ht 5' (1.524 m)   Wt 147 lb 4.8 oz (66.8 kg)   SpO2 95%   BMI 28.77 kg/m   Physical Exam:  Well appearing NAD HEENT: Unremarkable Neck:  No JVD, no thyromegally Lymphatics:  No adenopathy Back:  No CVA tenderness Lungs:  Clear with no wheezes HEART:  Regular rate rhythm, no murmurs, no rubs, no clicks Abd:  soft, positive bowel sounds, no organomegally, no rebound, no guarding Ext:  2 plus pulses, no edema, no cyanosis, no clubbing Skin:  No rashes no nodules Neuro:  CN II through XII intact, motor grossly intact  Assess/Plan: Palpitations - she is still bothered. Lots of cardiac awareness and I have recommended a trial of Mexitil 150 bid.  CAD -she is s/p PCI and stable.  HTN - we discussed amlodipine  vs coreg vs a diuretic. She would like to restart amlodipine  and wear support stocking for her edema.   Danelle Kortney Potvin,MD

## 2024-10-18 NOTE — Patient Instructions (Addendum)
 Medication Instructions:  Your physician has recommended you make the following change in your medication:  Start mexiletine twice a daily.  Lab Work: None ordered.  You may go to any [facility] Location for your lab work:  [facility] - [address] ([facility]) - [address] [phone] [address]  [place] - [address]   [place]  - [address]   [place] - [address] - [address] [employer]  - [address] [place] - [address] ([facility]   If you have labs (blood work) drawn today and your tests are completely normal, you will receive your results only by: [employer] (if you have MyChart)  If you have any lab test that is abnormal or we need to change your treatment, we will call you or send a MyChart message to review the results.  Testing/Procedures: None ordered.  Follow-Up: At [facility], you and your health needs are our priority.  As part of our continuing mission to provide you with exceptional heart care, we have created designated Provider Care Teams.  These Care Teams include your primary Cardiologist (physician) and Advanced Practice Providers (APPs -  Physician Assistants and Nurse Practitioners) who all work together to provide you with the care you need, when you need it.  We recommend signing up for the patient portal called MyChart.  Sign up information is provided on this After Visit Summary.  MyChart is used to connect with patients for Virtual Visits (Telemedicine).  Patients are able to view lab/test results, encounter notes, upcoming appointments, etc.  Non-urgent messages can be sent to your provider as well.   To learn more about what you can do with MyChart, go to [email or web address].    Your next appointment:   Feb 2026 in [place]  The format for your next appointment:   In Person  Provider:   Donnice Primus, MD or one of the following Advanced Practice Providers on your designated Care Team:   Charlies Arthur, [place] Ozell Jodie Passey, [place] Leotis Barrack, NP  Note: Remote monitoring is used to monitor your Pacemaker/ ICD from home. This monitoring reduces the number of office visits required to check your device to one time per year. It allows us  to keep an eye on the functioning of your device to ensure it is working properly.

## 2024-10-23 ENCOUNTER — Encounter: Payer: Self-pay | Admitting: Cardiology

## 2024-10-23 ENCOUNTER — Ambulatory Visit: Admitting: Cardiology

## 2024-10-23 ENCOUNTER — Ambulatory Visit: Attending: Cardiology | Admitting: Cardiology

## 2024-10-23 VITALS — BP 148/78 | HR 78 | Ht 60.0 in | Wt 157.0 lb

## 2024-10-23 DIAGNOSIS — E782 Mixed hyperlipidemia: Secondary | ICD-10-CM | POA: Diagnosis not present

## 2024-10-23 DIAGNOSIS — I25118 Atherosclerotic heart disease of native coronary artery with other forms of angina pectoris: Secondary | ICD-10-CM | POA: Insufficient documentation

## 2024-10-23 DIAGNOSIS — I6523 Occlusion and stenosis of bilateral carotid arteries: Secondary | ICD-10-CM | POA: Diagnosis not present

## 2024-10-23 DIAGNOSIS — I10 Essential (primary) hypertension: Secondary | ICD-10-CM | POA: Insufficient documentation

## 2024-10-23 MED ORDER — NITROGLYCERIN 0.4 MG SL SUBL: 0.4000 mg | SUBLINGUAL_TABLET | SUBLINGUAL | 3 refills | Status: AC | PRN

## 2024-10-23 NOTE — Patient Instructions (Signed)
 Medication Instructions:   Your physician recommends that you continue on your current medications as directed. Please refer to the Current Medication list given to you today.  I refilled NTG  Labwork: None today  Testing/Procedures: Your physician has requested that you have a carotid duplex. This test is an ultrasound of the carotid arteries in your neck. It looks at blood flow through these arteries that supply the brain with blood. Allow one hour for this exam. There are no restrictions or special instructions.   Follow-Up: 6 months  Any Other Special Instructions Will Be Listed Below (If Applicable).    Bring BP log to carotid ultrasound appointment to drop of for review.  If you need a refill on your cardiac medications before your next appointment, please call your pharmacy.

## 2024-10-23 NOTE — Progress Notes (Signed)
 Clinical Summary Melinda Acevedo is a 83 y.o.female seen today for follow up of the following medical problems    1.CAD - prior stent to RCA in 2010  - 08/2024 cath: mid LAD 70%, D2 80%, mid LCX 40%, prox to mid RCA 80% - discussions about PCI vs CABG, overall conclusion was to pursue PCI.  -10/02/24 DES to mid RCA, DES to mid LAD, PTCA LAD/diag - 05/2024 echo: LVEF 55%, no WMAs, grade I dd  - no chest pains. No SOB/DOE - compliant with meds  2. HTN - compliant with meds - 10mg  of norvasc  causes swelling, tolerated 5mg  daily - prior bradycardia on beta blocker - prior issues with intermittent low bp's, in general accepting higher bp's for her    - compliant with meds -      2. Hyperlipidemia - compliant with crestor  -most recent labs with pcp   - reports crestor  causes GI upset. Reports multiple other statins have caused similar symptoms.  -    3. OSA           5. Carotid stenosis - followed by vascular 02/2021 carotid US : bilateral mild disease.    6. Gastrostomy tube    7.Palpitations/PVCs - followed by EP - started on mexilitene by EP 10/18/24 for ongoing symptomatic PVCs - feels like PVCs are less frequent   Daughter worked [employer] worked at [facility], now lives at [facility] Past Medical History:  Diagnosis Date   Anxiety    CAD (coronary artery disease)    a. s/p stenting to LAD in 2010 per patient's report b. no ischemia by NST in 09/2020   Carotid artery occlusion    Diabetes mellitus without complication (HCC)    Glaucoma    History of hiatal hernia    Hyperlipidemia    Hypertension    Irregular heart beats    Seasonal allergies    Sleep apnea      Allergies  Allergen Reactions   Alendronate     Made bones hurt   Cinoxacin Hives   Contrast Media [Iodinated Contrast Media] Diarrhea    Only kidney IV dyes - Iodine?   Diltiazem     made her feel like she was in a cloud Blood Pressure dropped   Keflex [Cephalexin] Hives    Naproxen Other (See Comments)    Stomach upset   Nifedipine Other (See Comments)    Unknown reaction   Norfloxacin Hives   Poison Ivy Extract Itching   Pravastatin Sodium Other (See Comments)    Stomach cramps   Trimethoprim Hives   Valdecoxib Other (See Comments)    Lower belly pain     Current Outpatient Medications  Medication Sig Dispense Refill   amLODipine  (NORVASC ) 5 MG tablet Take 1 tablet by mouth once daily 90 tablet 3   aspirin  EC 81 MG tablet Take 1 tablet (81 mg total) by mouth daily. With meal, swallow whole. 90 tablet 3   benazepril  (LOTENSIN ) 40 MG tablet Take 1 tablet by mouth once daily (Patient taking differently: Take 20-40 mg by mouth daily. Depending on Blood pressure reading) 90 tablet 3   clopidogrel  (PLAVIX ) 75 MG tablet Take 600 mg (8 tablets) on day one and then on day two start taking 75 mg (1 tablet) once daily (Patient taking differently: Take 75 mg by mouth daily. Take 600 mg (8 tablets) on day one and then on day two start taking 75 mg (1 tablet) once daily) 98  tablet 3   CRESTOR  10 MG tablet TAKE 1 TABLET BY MOUTH ON MONDAY, WEDNESDAY AND FRIDAY 30 tablet 3   LORazepam  (ATIVAN ) 0.5 MG tablet Take 0.5 mg by mouth at bedtime.     mexiletine (MEXITIL) 150 MG capsule Take 1 capsule (150 mg total) by mouth 2 (two) times daily. 180 capsule 3   pantoprazole  (PROTONIX ) 20 MG tablet Take 1 tablet (20 mg total) by mouth 2 (two) times daily. 180 tablet 3   nitroGLYCERIN  (NITROSTAT ) 0.4 MG SL tablet Place 1 tablet (0.4 mg total) under the tongue every 5 (five) minutes as needed for chest pain. 25 tablet 3   No current facility-administered medications for this visit.     Past Surgical History:  Procedure Laterality Date   ABDOMINAL HYSTERECTOMY     CATARACT EXTRACTION Bilateral    CHOLECYSTECTOMY     COLONOSCOPY  2017   Dr.Williams in [place] , [place]   COLONOSCOPY WITH PROPOFOL  N/A 02/06/2021   Procedure: COLONOSCOPY WITH PROPOFOL ;  Surgeon: Eartha Angelia Sieving, MD;  [facility];  Service: Gastroenterology;  Laterality: N/A;  10:30   COLONOSCOPY WITH PROPOFOL  N/A 09/29/2021   Procedure: COLONOSCOPY WITH PROPOFOL ;  Surgeon: Eartha Angelia Sieving, MD;  [facility];  Service: Gastroenterology;  Laterality: N/A;  10:05   CORONARY ANGIOPLASTY     stent   CORONARY PRESSURE/FFR WITH 3D MAPPING N/A 09/04/2024   Procedure: Coronary Pressure/FFR w/3D Mapping;  Surgeon: Elmira Newman PARAS, MD;  [facility];  Service: Cardiovascular;  Laterality: N/A;   CORONARY PRESSURE/FFR WITH 3D MAPPING N/A 10/02/2024   Procedure: Coronary Pressure/FFR w/3D Mapping;  Surgeon: Elmira Newman PARAS, MD;  [facility];  Service: Cardiovascular;  Laterality: N/A;   CORONARY STENT INTERVENTION N/A 10/02/2024   Procedure: CORONARY STENT INTERVENTION;  Surgeon: Elmira Newman PARAS, MD;  [facility];  Service: Cardiovascular;  Laterality: N/A;   CORONARY ULTRASOUND/IVUS N/A 09/04/2024   Procedure: Coronary Ultrasound/IVUS;  Surgeon: Elmira Newman PARAS, MD;  [facility];  Service: Cardiovascular;  Laterality: N/A;   CORONARY ULTRASOUND/IVUS N/A 10/02/2024   Procedure: Coronary Ultrasound/IVUS;  Surgeon: Elmira Newman PARAS, MD;  [facility];  Service: Cardiovascular;  Laterality: N/A;   ESOPHAGOGASTRODUODENOSCOPY (EGD) WITH PROPOFOL  N/A 02/06/2021   Procedure: ESOPHAGOGASTRODUODENOSCOPY (EGD) WITH PROPOFOL ;  Surgeon: Eartha Angelia Sieving, MD;  [facility];  Service: Gastroenterology;  Laterality: N/A;   ESOPHAGOGASTRODUODENOSCOPY (EGD) WITH PROPOFOL  N/A 04/17/2021   Procedure: ESOPHAGOGASTRODUODENOSCOPY (EGD) WITH PROPOFOL ;  Surgeon: Mavis Anes, MD;  Location: [facility];  Service: General;  Laterality: N/A;   ESOPHAGOGASTRODUODENOSCOPY (EGD) WITH PROPOFOL  N/A 05/14/2021   Procedure: ESOPHAGOGASTRODUODENOSCOPY (EGD) WITH PROPOFOL ;  Surgeon:  Mavis Anes, MD;  Location: [facility];  Service: General;  Laterality: N/A;   heart catherization  8010,8000, 2010   IR GASTROSTOMY TUBE REMOVAL  05/13/2021   LEFT HEART CATH AND CORONARY ANGIOGRAPHY N/A 09/04/2024   Procedure: LEFT HEART CATH AND CORONARY ANGIOGRAPHY;  Surgeon: Elmira Newman PARAS, MD;  [facility];  Service: Cardiovascular;  Laterality: N/A;   PEG PLACEMENT N/A 04/17/2021   Procedure: PERCUTANEOUS ENDOSCOPIC GASTROSTOMY (PEG) PLACEMENT;  Surgeon: Mavis Anes, MD;  Location: [facility];  Service: General;  Laterality: N/A;   PEG PLACEMENT N/A 05/14/2021   Procedure: PERCUTANEOUS ENDOSCOPIC GASTROSTOMY (PEG) PLACEMENT;  Surgeon: Mavis Anes, MD;  Location: [facility];  Service: General;  Laterality: N/A;   PEG PLACEMENT N/A 11/10/2022  Procedure: PERCUTANEOUS GASTROSTOMY (PEG) PLACEMENT WITHOUT ENDOSCOPY - REPLACEMENT;  Surgeon: Mavis Anes, MD;  [facility];  Service: Gastroenterology;  Laterality: N/A;   POLYPECTOMY  09/29/2021   Procedure: POLYPECTOMY;  Surgeon: Eartha Flavors, [place], MD;  [facility];  Service: Gastroenterology;;   RECTOCELE REPAIR     TONSILLECTOMY     TUBAL LIGATION  1986   UPPER GASTROINTESTINAL ENDOSCOPY  2015   Dr.Williams in [place], [place]   VAGINAL HYSTERECTOMY  1991     Allergies  Allergen Reactions   Alendronate     Made bones hurt   Cinoxacin Hives   Contrast Media [Iodinated Contrast Media] Diarrhea    Only kidney IV dyes - Iodine?   Diltiazem     made her feel like she was in a cloud Blood Pressure dropped   Keflex [Cephalexin] Hives   Naproxen Other (See Comments)    Stomach upset   Nifedipine Other (See Comments)    Unknown reaction   Norfloxacin Hives   Poison Ivy Extract Itching   Pravastatin Sodium Other (See Comments)    Stomach cramps   Trimethoprim Hives   Valdecoxib Other (See Comments)    Lower belly pain      Family History  Problem Relation Age of Onset   COPD  Mother    Heart disease Mother    Heart disease Father    Heart attack Father    Hypertension Sister    Mitral valve prolapse Brother      Social History Ms. Melinda Acevedo reports that she quit smoking about 61 years ago. Her smoking use included cigarettes. She started smoking about 63 years ago. She has never used smokeless tobacco. Ms. Melinda Acevedo reports no history of alcohol use.    Physical Examination Today's Vitals   10/23/24 1019 10/23/24 1054  BP: (!) 156/79 (!) 148/78  Pulse: 78   SpO2: 98%   Weight: 157 lb (71.2 kg)   Height: 5' (1.524 m)    Body mass index is 30.66 kg/m.  Gen: resting comfortably, no acute distress HEENT: no scleral icterus, pupils equal round and reactive, no palptable cervical adenopathy,  CV: RRR, no m/rg, no jvd Resp: Clear to auscultation bilaterally GI: abdomen is soft, non-tender, non-distended, normal bowel sounds, no hepatosplenomegaly MSK: extremities are warm, no edema.  Skin: warm, no rash Neuro:  no focal deficits Psych: appropriate affect   Diagnostic Studies  03/2021 echo 1. Basal inferior/inferoseptal hypokinesis. . Left ventricular ejection  fraction, by estimation, is 50 to 55%. The left ventricle has normal  function. Left ventricular diastolic parameters are indeterminate.   2. Right ventricular systolic function is normal. The right ventricular  size is normal.   3. The mitral valve is normal in structure. Trivial mitral valve  regurgitation.   4. The aortic valve is tricuspid. Aortic valve regurgitation is not  visualized. Mild aortic valve sclerosis is present, with no evidence of  aortic valve stenosis.   5. The inferior vena cava is normal in size with greater than 50%  respiratory variability, suggesting right atrial pressure of 3 mmHg.   Assessment and Plan   1. CAD - doing well after recent stenting, continue current meds     2. HTN - bp elevated today, will submit bp log over next few weeks. Have been  conservative with bp management due to intermittent low bp's     3. Hyperlipidemia - stomach upset on crestor  10mg  daily, taking 10mg  MWF but willing to retry higher dosing  if neccesary - request labs from pcp  4. Carotids stenosis - repeat carotid US      Dorn PHEBE Ross, M.D.

## 2024-10-30 ENCOUNTER — Ambulatory Visit

## 2024-10-30 ENCOUNTER — Ambulatory Visit: Admitting: Cardiology

## 2024-11-05 NOTE — Addendum Note (Signed)
 Addended by: KENETH KNEE C on: 11/05/2024 03:23 PM   Modules accepted: Orders

## 2024-11-06 ENCOUNTER — Ambulatory Visit

## 2024-11-13 ENCOUNTER — Ambulatory Visit (HOSPITAL_COMMUNITY)
Admission: RE | Admit: 2024-11-13 | Discharge: 2024-11-13 | Disposition: A | Discharge status: Home / Self Care | Source: Ambulatory Visit | Attending: Cardiology | Admitting: Cardiology

## 2024-11-13 ENCOUNTER — Encounter: Payer: Self-pay | Admitting: Cardiology

## 2024-11-13 DIAGNOSIS — I6523 Occlusion and stenosis of bilateral carotid arteries: Secondary | ICD-10-CM | POA: Diagnosis present

## 2024-11-18 ENCOUNTER — Ambulatory Visit: Payer: Self-pay | Admitting: Cardiology

## 2024-11-19 ENCOUNTER — Other Ambulatory Visit: Payer: Self-pay | Admitting: Internal Medicine

## 2024-11-19 ENCOUNTER — Encounter: Payer: Self-pay | Admitting: *Deleted

## 2024-11-23 MED ORDER — MEXILETINE HCL 150 MG PO CAPS: 150.0000 mg | ORAL_CAPSULE | Freq: Two times a day (BID) | ORAL | 3 refills | Status: AC

## 2024-11-23 MED ORDER — CLOPIDOGREL BISULFATE 75 MG PO TABS: ORAL_TABLET | ORAL | 3 refills | Status: AC

## 2024-11-24 ENCOUNTER — Other Ambulatory Visit: Payer: Self-pay | Admitting: Cardiology

## 2025-01-08 ENCOUNTER — Ambulatory Visit: Payer: Self-pay | Admitting: Cardiology

## 2025-02-01 ENCOUNTER — Ambulatory Visit: Admitting: Student in an Organized Health Care Education/Training Program
